# Patient Record
Sex: Female | Born: 1990 | Race: Black or African American | Hispanic: No | Marital: Married | Smoking: Never smoker
Health system: Southern US, Community
[De-identification: ages and names within clinical notes are randomized; demographics above are authoritative.]

## PROBLEM LIST (undated history)

## (undated) ENCOUNTER — Inpatient Hospital Stay (HOSPITAL_COMMUNITY): Payer: Self-pay

## (undated) ENCOUNTER — Emergency Department (HOSPITAL_COMMUNITY): Admission: EM | Payer: 59

## (undated) DIAGNOSIS — S83519A Sprain of anterior cruciate ligament of unspecified knee, initial encounter: Secondary | ICD-10-CM

## (undated) DIAGNOSIS — R87629 Unspecified abnormal cytological findings in specimens from vagina: Secondary | ICD-10-CM

## (undated) HISTORY — PX: NO PAST SURGERIES: SHX2092

## (undated) HISTORY — DX: Unspecified abnormal cytological findings in specimens from vagina: R87.629

---

## 2012-02-29 ENCOUNTER — Encounter (HOSPITAL_COMMUNITY): Payer: Self-pay

## 2012-02-29 ENCOUNTER — Emergency Department (HOSPITAL_COMMUNITY)
Admission: EM | Admit: 2012-02-29 | Discharge: 2012-02-29 | Disposition: A | Discharge status: Home / Self Care | Payer: Medicaid Other | Attending: Emergency Medicine | Admitting: Emergency Medicine

## 2012-02-29 ENCOUNTER — Emergency Department (HOSPITAL_COMMUNITY): Payer: Medicaid Other

## 2012-02-29 DIAGNOSIS — M25569 Pain in unspecified knee: Secondary | ICD-10-CM | POA: Insufficient documentation

## 2012-02-29 DIAGNOSIS — M25561 Pain in right knee: Secondary | ICD-10-CM

## 2012-02-29 MED ORDER — IBUPROFEN 800 MG PO TABS
800.0000 mg | ORAL_TABLET | Freq: Once | ORAL | Status: AC
  Administered 2012-02-29: 800 mg via ORAL
  Filled 2012-02-29: qty 1

## 2012-02-29 MED ORDER — IBUPROFEN 800 MG PO TABS: 800.0000 mg | ORAL_TABLET | Freq: Three times a day (TID) | ORAL | Status: AC | PRN

## 2012-02-29 NOTE — Discharge Instructions (Signed)
Read the information below.  Please follow up with the orthopedist listed above if you continue to have pain.  Use the RICE treatment as discussed.  You may return to the ER at any time for worsening condition or any new symptoms that concern you.   Knee Pain The knee is the complex joint between your thigh and your lower leg. It is made up of bones, tendons, ligaments, and cartilage. The bones that make up the knee are:  The femur in the thigh.   The tibia and fibula in the lower leg.   The patella or kneecap riding in the groove on the lower femur.  CAUSES  Knee pain is a common complaint with many causes. A few of these causes are:  Injury, such as:   A ruptured ligament or tendon injury.   Torn cartilage.   Medical conditions, such as:   Gout   Arthritis   Infections   Overuse, over training or overdoing a physical activity.  Knee pain can be minor or severe. Knee pain can accompany debilitating injury. Minor knee problems often respond well to self-care measures or get well on their own. More serious injuries may need medical intervention or even surgery. SYMPTOMS The knee is complex. Symptoms of knee problems can vary widely. Some of the problems are:  Pain with movement and weight bearing.   Swelling and tenderness.   Buckling of the knee.   Inability to straighten or extend your knee.   Your knee locks and you cannot straighten it.   Warmth and redness with pain and fever.   Deformity or dislocation of the kneecap.  DIAGNOSIS  Determining what is wrong may be very straight forward such as when there is an injury. It can also be challenging because of the complexity of the knee. Tests to make a diagnosis may include:  Your caregiver taking a history and doing a physical exam.   Routine X-rays can be used to rule out other problems. X-rays will not reveal a cartilage tear. Some injuries of the knee can be diagnosed by:   Arthroscopy a surgical technique by  which a small video camera is inserted through tiny incisions on the sides of the knee. This procedure is used to examine and repair internal knee joint problems. Tiny instruments can be used during arthroscopy to repair the torn knee cartilage (meniscus).   Arthrography is a radiology technique. A contrast liquid is directly injected into the knee joint. Internal structures of the knee joint then become visible on X-ray film.   An MRI scan is a non x-ray radiology procedure in which magnetic fields and a computer produce two- or three-dimensional images of the inside of the knee. Cartilage tears are often visible using an MRI scanner. MRI scans have largely replaced arthrography in diagnosing cartilage tears of the knee.   Blood work.   Examination of the fluid that helps to lubricate the knee joint (synovial fluid). This is done by taking a sample out using a needle and a syringe.  TREATMENT The treatment of knee problems depends on the cause. Some of these treatments are:  Depending on the injury, proper casting, splinting, surgery or physical therapy care will be needed.   Give yourself adequate recovery time. Do not overuse your joints. If you begin to get sore during workout routines, back off. Slow down or do fewer repetitions.   For repetitive activities such as cycling or running, maintain your strength and nutrition.   Alternate muscle  groups. For example if you are a weight lifter, work the upper body on one day and the lower body the next.   Either tight or weak muscles do not give the proper support for your knee. Tight or weak muscles do not absorb the stress placed on the knee joint. Keep the muscles surrounding the knee strong.   Take care of mechanical problems.   If you have flat feet, orthotics or special shoes may help. See your caregiver if you need help.   Arch supports, sometimes with wedges on the inner or outer aspect of the heel, can help. These can shift pressure  away from the side of the knee most bothered by osteoarthritis.   A brace called an "unloader" brace also may be used to help ease the pressure on the most arthritic side of the knee.   If your caregiver has prescribed crutches, braces, wraps or ice, use as directed. The acronym for this is PRICE. This means protection, rest, ice, compression and elevation.   Nonsteroidal anti-inflammatory drugs (NSAID's), can help relieve pain. But if taken immediately after an injury, they may actually increase swelling. Take NSAID's with food in your stomach. Stop them if you develop stomach problems. Do not take these if you have a history of ulcers, stomach pain or bleeding from the bowel. Do not take without your caregiver's approval if you have problems with fluid retention, heart failure, or kidney problems.   For ongoing knee problems, physical therapy may be helpful.   Glucosamine and chondroitin are over-the-counter dietary supplements. Both may help relieve the pain of osteoarthritis in the knee. These medicines are different from the usual anti-inflammatory drugs. Glucosamine may decrease the rate of cartilage destruction.   Injections of a corticosteroid drug into your knee joint may help reduce the symptoms of an arthritis flare-up. They may provide pain relief that lasts a few months. You may have to wait a few months between injections. The injections do have a small increased risk of infection, water retention and elevated blood sugar levels.   Hyaluronic acid injected into damaged joints may ease pain and provide lubrication. These injections may work by reducing inflammation. A series of shots may give relief for as long as 6 months.   Topical painkillers. Applying certain ointments to your skin may help relieve the pain and stiffness of osteoarthritis. Ask your pharmacist for suggestions. Many over the-counter products are approved for temporary relief of arthritis pain.   In some countries,  doctors often prescribe topical NSAID's for relief of chronic conditions such as arthritis and tendinitis. A review of treatment with NSAID creams found that they worked as well as oral medications but without the serious side effects.  PREVENTION  Maintain a healthy weight. Extra pounds put more strain on your joints.   Get strong, stay limber. Weak muscles are a common cause of knee injuries. Stretching is important. Include flexibility exercises in your workouts.   Be smart about exercise. If you have osteoarthritis, chronic knee pain or recurring injuries, you may need to change the way you exercise. This does not mean you have to stop being active. If your knees ache after jogging or playing basketball, consider switching to swimming, water aerobics or other low-impact activities, at least for a few days a week. Sometimes limiting high-impact activities will provide relief.   Make sure your shoes fit well. Choose footwear that is right for your sport.   Protect your knees. Use the proper gear for knee-sensitive  activities. Use kneepads when playing volleyball or laying carpet. Buckle your seat belt every time you drive. Most shattered kneecaps occur in car accidents.   Rest when you are tired.  SEEK MEDICAL CARE IF:  You have knee pain that is continual and does not seem to be getting better.  SEEK IMMEDIATE MEDICAL CARE IF:  Your knee joint feels hot to the touch and you have a high fever. MAKE SURE YOU:   Understand these instructions.   Will watch your condition.   Will get help right away if you are not doing well or get worse.  Document Released: 07/14/2007 Document Revised: 09/05/2011 Document Reviewed: 07/14/2007 [facility] Patient Information 2012 [place], [place].

## 2012-02-29 NOTE — ED Notes (Signed)
Pt complains of rightt knee and leg pain since roughhousing yesterday at home, sts twisted knee now had difficulty bearing weight on right leg.

## 2012-02-29 NOTE — Progress Notes (Signed)
Orthopedic Tech Progress Note Patient Details:  Kelly [name] 1991/02/21 [id number]  Ortho Devices Type of Ortho Device: Knee Immobilizer;Crutches Ortho Device/Splint Interventions: Application   Cammer, Mickie Bail 02/29/2012, 2:30 PM

## 2012-02-29 NOTE — ED Provider Notes (Signed)
History     CSN: [id number]  Arrival date & time 02/29/12  1305   First MD Initiated Contact with Patient 02/29/12 1322      Chief Complaint  Patient presents with  . Leg Injury    (Consider location/radiation/quality/duration/timing/severity/associated sxs/prior treatment) HPI Comments: Patient reports she was running around her house playing, jumping into her room and twisted her right knee.  Since that moment, she has had diffuse pain in her anterior knee, pain with complete straightening or with flexing more than 90 degrees.  She is also having pain with any weight bearing.  Denies calf or ankle pain, weakness or numbness of her distal leg.  No hx of problems with this knee previously.    The history is provided by the patient and a parent.    History reviewed. No pertinent past medical history.  History reviewed. No pertinent past surgical history.  History reviewed. No pertinent family history.  History  Substance Use Topics  . Smoking status: Not on file  . Smokeless tobacco: Not on file  . Alcohol Use: Not on file    OB History    Grav Para Term Preterm Abortions TAB SAB Ect Mult Living                  Review of Systems  Skin: Negative for color change and wound.  Neurological: Negative for weakness and numbness.    Allergies  Review of patient's allergies indicates no known allergies.  Home Medications   Current Outpatient Rx  Name Route Sig Dispense Refill  . VITAMIN E 1000 UNITS PO CAPS Oral Take 1,000 Units by mouth every other day.      BP 115/69  Pulse 70  Temp(Src) 97.9 F (36.6 C) (Oral)  Resp 18  SpO2 99%  LMP 02/17/2012  Physical Exam  Nursing note and vitals reviewed. Constitutional: She appears well-developed and well-nourished.  HENT:  Head: Normocephalic and atraumatic.  Neck: Neck supple.  Pulmonary/Chest: Effort normal.  Musculoskeletal:       Right knee: She exhibits decreased range of motion, swelling and abnormal  meniscus. She exhibits no ecchymosis, no deformity, no laceration, no erythema, normal alignment, no LCL laxity and no MCL laxity. tenderness found. Medial joint line and lateral joint line tenderness noted.       Right knee: Diffuse tenderness around patella.  Negative anterior and posterior drawer test.  Thessaley test reproduces pain.  Distal pulses and sensation intact.    Neurological: She is alert.    ED Course  Procedures (including critical care time)  Labs Reviewed - No data to display Dg Knee Complete 4 Views Right  02/29/2012  *RADIOLOGY REPORT*  Clinical Data: Leg injury  RIGHT KNEE - COMPLETE 4+ VIEW  Comparison: None  Findings: There is no evidence of fracture or dislocation.  There is no evidence of arthropathy or other focal bone abnormality. Soft tissues are unremarkable.  IMPRESSION: Negative exam.  Original Report Authenticated By: Rosealee Albee, M.D.    1:41 PM Patient seen and examined.  Likely sprain or other ligamentous injury.  Mother insists on xray.    1. Pain in right knee       MDM  Patient with twisting injury to right knee.  No effusion, xray negative.  Suspect sprain vs meniscus injury.  Pt d/c home with knee immobilizer, crutches, pain medication, orthopedic follow up.  Patient verbalizes understanding and agrees with plan.         Rise Patience,  PA 02/29/12 2223

## 2012-03-07 NOTE — ED Provider Notes (Signed)
History/physical exam/procedure(s) were performed by non-physician practitioner and as supervising physician I was immediately available for consultation/collaboration. I have reviewed all notes and am in agreement with care and plan.   Koralyn Prestage S Bela Nyborg, MD 03/07/12 1724 

## 2013-02-08 ENCOUNTER — Ambulatory Visit (INDEPENDENT_AMBULATORY_CARE_PROVIDER_SITE_OTHER): Payer: Self-pay | Admitting: Gynecology

## 2013-02-08 ENCOUNTER — Encounter: Payer: Self-pay | Admitting: Gynecology

## 2013-02-08 VITALS — BP 120/68 | Ht 66.34 in | Wt 155.0 lb

## 2013-02-08 DIAGNOSIS — Z01419 Encounter for gynecological examination (general) (routine) without abnormal findings: Secondary | ICD-10-CM

## 2013-02-08 DIAGNOSIS — Z0000 Encounter for general adult medical examination without abnormal findings: Secondary | ICD-10-CM

## 2013-02-08 DIAGNOSIS — Z113 Encounter for screening for infections with a predominantly sexual mode of transmission: Secondary | ICD-10-CM

## 2013-02-08 DIAGNOSIS — Z309 Encounter for contraceptive management, unspecified: Secondary | ICD-10-CM

## 2013-02-08 LAB — POCT URINALYSIS DIPSTICK

## 2013-02-08 LAB — POCT URINE PREGNANCY: Preg Test, Ur: NEGATIVE

## 2013-02-08 NOTE — Patient Instructions (Addendum)
Contraception Choices  Contraception (birth control) is the use of any methods or devices to prevent pregnancy. Below are some methods to help avoid pregnancy.  HORMONAL METHODS   · Contraceptive implant. This is a thin, plastic tube containing progesterone hormone. It does not contain estrogen hormone. Your caregiver inserts the tube in the inner part of the upper arm. The tube can remain in place for up to 3 years. After 3 years, the implant must be removed. The implant prevents the ovaries from releasing an egg (ovulation), thickens the cervical mucus which prevents sperm from entering the uterus, and thins the lining of the inside of the uterus.  · Progesterone-only injections. These injections are given every 3 months by your caregiver to prevent pregnancy. This synthetic progesterone hormone stops the ovaries from releasing eggs. It also thickens cervical mucus and changes the uterine lining. This makes it harder for sperm to survive in the uterus.  · Birth control pills. These pills contain estrogen and progesterone hormone. They work by stopping the egg from forming in the ovary (ovulation). Birth control pills are prescribed by a caregiver. Birth control pills can also be used to treat heavy periods.  · Minipill. This type of birth control pill contains only the progesterone hormone. They are taken every day of each month and must be prescribed by your caregiver.  · Birth control patch. The patch contains hormones similar to those in birth control pills. It must be changed once a week and is prescribed by a caregiver.  · Vaginal ring. The ring contains hormones similar to those in birth control pills. It is left in the vagina for 3 weeks, removed for 1 week, and then a new one is put back in place. The patient must be comfortable inserting and removing the ring from the vagina. A caregiver's prescription is necessary.  · Emergency contraception. Emergency contraceptives prevent pregnancy after unprotected  sexual intercourse. This pill can be taken right after sex or up to 5 days after unprotected sex. It is most effective the sooner you take the pills after having sexual intercourse. Emergency contraceptive pills are available without a prescription. Check with your pharmacist. Do not use emergency contraception as your only form of birth control.  BARRIER METHODS   · Female condom. This is a thin sheath (latex or rubber) that is worn over the penis during sexual intercourse. It can be used with spermicide to increase effectiveness.  · Female condom. This is a soft, loose-fitting sheath that is put into the vagina before sexual intercourse.  · Diaphragm. This is a soft, latex, dome-shaped barrier that must be fitted by a caregiver. It is inserted into the vagina, along with a spermicidal jelly. It is inserted before intercourse. The diaphragm should be left in the vagina for 6 to 8 hours after intercourse.  · Cervical cap. This is a round, soft, latex or plastic cup that fits over the cervix and must be fitted by a caregiver. The cap can be left in place for up to 48 hours after intercourse.  · Sponge. This is a soft, circular piece of polyurethane foam. The sponge has spermicide in it. It is inserted into the vagina after wetting it and before sexual intercourse.  · Spermicides. These are chemicals that kill or block sperm from entering the cervix and uterus. They come in the form of creams, jellies, suppositories, foam, or tablets. They do not require a prescription. They are inserted into the vagina with an applicator before having sexual intercourse.   IUD). This is a T-shaped device that is put in a woman's uterus during a menstrual period to prevent pregnancy. There are 2 types:  Copper IUD. This type of IUD is wrapped in copper wire and is placed inside the uterus. Copper makes the uterus and  fallopian tubes produce a fluid that kills sperm. It can stay in place for 10 years.  Hormone IUD. This type of IUD contains the hormone progestin (synthetic progesterone). The hormone thickens the cervical mucus and prevents sperm from entering the uterus, and it also thins the uterine lining to prevent implantation of a fertilized egg. The hormone can weaken or kill the sperm that get into the uterus. It can stay in place for 5 years. PERMANENT METHODS OF CONTRACEPTION  Female tubal ligation. This is when the woman's fallopian tubes are surgically sealed, tied, or blocked to prevent the egg from traveling to the uterus.  Female sterilization. This is when the female has the tubes that carry sperm tied off (vasectomy).This blocks sperm from entering the vagina during sexual intercourse. After the procedure, the man can still ejaculate fluid (semen). NATURAL PLANNING METHODS  Natural family planning. This is not having sexual intercourse or using a barrier method (condom, diaphragm, cervical cap) on days the woman could become pregnant.  Calendar method. This is keeping track of the length of each menstrual cycle and identifying when you are fertile.  Ovulation method. This is avoiding sexual intercourse during ovulation.  Symptothermal method. This is avoiding sexual intercourse during ovulation, using a thermometer and ovulation symptoms.  Post-ovulation method. This is timing sexual intercourse after you have ovulated. Regardless of which type or method of contraception you choose, it is important that you use condoms to protect against the transmission of sexually transmitted diseases (STDs). Talk with your caregiver about which form of contraception is most appropriate for you. Document Released: 09/16/2005 Document Revised: 12/09/2011 Document Reviewed: 01/23/2011 [facility] Patient Information 2013 [place], [place].   Call if decide to become sexually active

## 2013-02-08 NOTE — Progress Notes (Signed)
22 y.o.  Single  African American female   No obstetric history on file. here for annual exam.  Pt reports first sexual partner in March, reports condoms caused swelling, itching and redness. LMP was Plan B induced.  Pt not interested in continuing sexual activities until marriage, but is interested in her options.  Planning [employer] transfer next year.  Pt finished gardasil vaccine at 17.   Patient's last menstrual period was 01/16/2013.          Sexually active: yes  The current method of family planning is OCP (estrogen/progesterone).    Exercising: light weights lifting; running qoday Last mammogram:  none Last pap smear: none History of abnormal pap: none Smoking: none Alcohol: none  Last colonoscopy: none Last Bone Density:  none Last tetanus shot: none Last cholesterol check:  none  Hgb:                Urine: Leuks 2     No health maintenance topics applied.  Family History  Problem Relation Age of Onset  . Diabetes Father   . Breast cancer Maternal Grandmother   . Pancreatic cancer Maternal Grandmother     There are no active problems to display for this patient.   No past medical history on file.  No past surgical history on file.  Allergies: Review of patient's allergies indicates no known allergies.  Current Outpatient Prescriptions  Medication Sig Dispense Refill  . vitamin E 1000 UNIT capsule Take 1,000 Units by mouth every other day.       No current facility-administered medications for this visit.    ROS: Pertinent items are noted in HPI.  Social Hx:    Exam:    BP 120/68  Ht 5' 6.34" (1.685 m)  Wt 155 lb (70.308 kg)  BMI 24.76 kg/m2  LMP 01/16/2013   Wt Readings from Last 3 Encounters:  02/08/13 155 lb (70.308 kg) (84%*, Z = 0.99)  02/29/12 157 lb (71.215 kg) (87%*, Z = 1.13)   * Growth percentiles are based on CDC 2-20 Years data.     Ht Readings from Last 3 Encounters:  02/08/13 5' 6.34" (1.685 m) (79%*, Z = 0.80)  02/29/12 5\' 8"  (1.727  m) (93%*, Z = 1.46)   * Growth percentiles are based on CDC 2-20 Years data.    General appearance: alert, cooperative and appears stated age Head: Normocephalic, without obvious abnormality, atraumatic Neck: no adenopathy, supple, symmetrical, trachea midline and thyroid not enlarged, symmetric, no tenderness/mass/nodules Lungs: clear to auscultation bilaterally Breasts: Inspection negative, No nipple retraction or dimpling, No nipple discharge or bleeding, No axillary or supraclavicular adenopathy, Normal to palpation without dominant masses Heart: regular rate and rhythm Abdomen: soft, non-tender; bowel sounds normal; no masses,  no organomegaly Extremities: extremities normal, atraumatic, no cyanosis or edema Skin: Skin color, texture, turgor normal. No rashes or lesions Lymph nodes: Cervical, supraclavicular, and axillary nodes normal. No abnormal inguinal nodes palpated Neurologic: Grossly normal   Pelvic: External genitalia:  no lesions              Urethra:  normal appearing urethra with no masses, tenderness or lesions              Bartholins and Skenes: normal                 Vagina: normal appearing vagina with normal color and discharge, no lesions              Cervix: normal appearance  Pap taken: no        Bimanual Exam:  Uterus:  uterus is normal size, shape, consistency and nontender                                      Adnexa: normal adnexa in size, nontender and no masses                                      Rectovaginal: Confirms                                      Anus:  normal sphincter tone, no lesions  A: normal gyn exam     P: counseled on STD prevention, use and side effects of OCP's, diet and exercise Info on contraceptive choices given PAP next year return annually or prn     An After Visit Summary was printed and given to the patient.

## 2013-02-09 LAB — GC/CHLAMYDIA PROBE AMP, URINE: GC Probe Amp, Urine: NEGATIVE

## 2013-02-10 ENCOUNTER — Telehealth: Payer: Self-pay | Admitting: *Deleted

## 2013-02-10 NOTE — Telephone Encounter (Signed)
Left Message To Call Back  

## 2013-02-10 NOTE — Telephone Encounter (Signed)
Message copied by Lorraine Lax on Wed Feb 10, 2013  9:34 AM ------      Message from: Douglass Rivers      Created: Tue Feb 09, 2013  3:17 PM       Inform gc/ctm neg ------

## 2013-02-11 NOTE — Telephone Encounter (Signed)
Pt notified of labs 02/10/13 ( see result note)

## 2013-02-27 ENCOUNTER — Encounter: Payer: Self-pay | Admitting: Physician Assistant

## 2013-02-27 ENCOUNTER — Ambulatory Visit: Payer: Self-pay | Admitting: Physician Assistant

## 2013-02-27 VITALS — BP 120/76 | HR 64 | Temp 98.1°F | Resp 16 | Ht 67.0 in | Wt 158.0 lb

## 2013-02-27 DIAGNOSIS — R350 Frequency of micturition: Secondary | ICD-10-CM

## 2013-02-27 DIAGNOSIS — Z131 Encounter for screening for diabetes mellitus: Secondary | ICD-10-CM

## 2013-02-27 LAB — POCT URINALYSIS DIPSTICK
Glucose, UA: 250
Ketones, UA: 15
Nitrite, UA: POSITIVE
Protein, UA: 100
Spec Grav, UA: <=1.005
Urobilinogen, UA: >=8
pH, UA: 5

## 2013-02-27 LAB — POCT UA - MICROSCOPIC ONLY
Bacteria, U Microscopic: NEGATIVE
Casts, Ur, LPF, POC: NEGATIVE
Epithelial cells, urine per micros: NEGATIVE
Mucus, UA: NEGATIVE
Yeast, UA: NEGATIVE

## 2013-02-27 LAB — GLUCOSE, POCT (MANUAL RESULT ENTRY): POC Glucose: 107 mg/dl — AB (ref 70–99)

## 2013-02-27 MED ORDER — CIPROFLOXACIN HCL 500 MG PO TABS: 500.0000 mg | ORAL_TABLET | Freq: Two times a day (BID) | ORAL | Status: DC

## 2013-02-27 NOTE — Progress Notes (Signed)
  Subjective:    Patient ID: Kelly [name], female    DOB: 04-Feb-1991, 22 y.o.   MRN: [id number]  HPI 22 year old female presents with 10 day history of urinary frequency, dysuria, suprapubic pressure, and urinary hesitancy.  States symptoms have progressively worsened despite OTC treatment with cranberry pills and AZO.  Denies hematuria, vaginal discharge, fever, chills, nausea, or vomiting.  Had recent STD screening on 02/10/13 which was negative.   Patient otherwise healthy with no other concerns today.    Review of Systems  Constitutional: Negative for fever and chills.  Gastrointestinal: Negative for nausea, vomiting and abdominal pain.  Genitourinary: Positive for dysuria and frequency. Negative for vaginal discharge.  Neurological: Negative for headaches.       Objective:   Physical Exam  Constitutional: She is oriented to person, place, and time. She appears well-developed and well-nourished.  HENT:  Head: Normocephalic and atraumatic.  Right Ear: External ear normal.  Left Ear: External ear normal.  Eyes: Conjunctivae are normal.  Neck: Normal range of motion.  Cardiovascular: Normal rate, regular rhythm and normal heart sounds.   Pulmonary/Chest: Effort normal and breath sounds normal.  Abdominal: Soft. Bowel sounds are normal. There is tenderness (suprapubic). There is no rebound, no guarding and no CVA tenderness.  Neurological: She is alert and oriented to person, place, and time.  Psychiatric: She has a normal mood and affect. Her behavior is normal. Judgment and thought content normal.     Results for orders placed in visit on 02/27/13  POCT URINALYSIS DIPSTICK      Result Value Range   Color, UA red     Clarity, UA cloudy     Glucose, UA 250     Bilirubin, UA moderate     Ketones, UA 15     Spec Grav, UA <=1.005     Blood, UA trace     pH, UA 5.0     Protein, UA 100     Urobilinogen, UA >=8.0     Nitrite, UA positive     Leukocytes, UA large (3+)    POCT  UA - MICROSCOPIC ONLY      Result Value Range   WBC, Ur, HPF, POC 0-2     RBC, urine, microscopic 0-1     Bacteria, U Microscopic neg     Mucus, UA neg     Epithelial cells, urine per micros neg     Crystals, Ur, HPF, POC oxalate     Casts, Ur, LPF, POC neg     Yeast, UA neg    GLUCOSE, POCT (MANUAL RESULT ENTRY)      Result Value Range   POC Glucose 107 (*) 70 - 99 mg/dl        Assessment & Plan:  Urine frequency - Plan: POCT urinalysis dipstick, POCT UA - Microscopic Only, Urine culture, ciprofloxacin (CIPRO) 500 MG tablet  Screening for diabetes mellitus - Plan: POCT glucose (manual entry)  Urine culture sent Start Cipro 500 mg bid x 7 days Continue Azo as needed for dysuria Follow up if symptoms worsen or fail to improve

## 2013-03-01 LAB — URINE CULTURE
Colony Count: NO GROWTH
Organism ID, Bacteria: NO GROWTH

## 2013-03-03 ENCOUNTER — Telehealth: Payer: Self-pay

## 2013-03-03 NOTE — Telephone Encounter (Signed)
No, we cannot send this note in. We did not discuss her being out of work and if she is still feeling that badly, she needs a recheck.

## 2013-03-03 NOTE — Telephone Encounter (Signed)
Kelly [name] had indicated in her note patient would return to work same day she was evaluated (02/27/13) please advise if okay to provide work note for days requested.

## 2013-03-03 NOTE — Telephone Encounter (Signed)
Pt.notified

## 2013-03-03 NOTE — Telephone Encounter (Signed)
Pt is still having uti symptoms and the lab results has come back that she did not have a uti   Please call [phone]

## 2013-03-03 NOTE — Telephone Encounter (Signed)
Patient is requesting an updated return to work note. Patient had E. Coli and was out of work from 6/1-6/3 and returned to work today 6/4. Patient also wants this note faxed to her job if possible. Fax #: [phone]. Call back #: [phone]

## 2013-03-04 NOTE — Telephone Encounter (Signed)
Thanks, I called her to advise, unfortunately her mail box is not set up yet.

## 2013-03-04 NOTE — Telephone Encounter (Signed)
Please advise. She continues to have urinary frequency.

## 2013-03-04 NOTE — Telephone Encounter (Signed)
RTC for further testing.

## 2013-03-05 NOTE — Telephone Encounter (Signed)
I spoke with patient in regards to her situation she stated that she seems to have bladder weakness. She recently had a pelvic exam performed at her gynecologist. I spoke with Herbert Seta (who saw her for UTI sxs) she did not feel the pelvic exam would cause this. I informed pt of Heather's thoughts, but also advised her to contact her GYN if she had concerns regarding that. I also stated that she mentioned having urinary frequency still and I told her we would need to re-evaluate due to her ua cx being negative. She understood.

## 2013-08-22 ENCOUNTER — Emergency Department (HOSPITAL_COMMUNITY)
Admission: EM | Admit: 2013-08-22 | Discharge: 2013-08-22 | Disposition: A | Discharge status: Home / Self Care | Payer: Self-pay | Attending: Emergency Medicine | Admitting: Emergency Medicine

## 2013-08-22 ENCOUNTER — Emergency Department (HOSPITAL_COMMUNITY): Payer: Self-pay

## 2013-08-22 ENCOUNTER — Encounter (HOSPITAL_COMMUNITY): Payer: Self-pay | Admitting: Emergency Medicine

## 2013-08-22 DIAGNOSIS — O200 Threatened abortion: Secondary | ICD-10-CM | POA: Insufficient documentation

## 2013-08-22 DIAGNOSIS — Z791 Long term (current) use of non-steroidal anti-inflammatories (NSAID): Secondary | ICD-10-CM | POA: Insufficient documentation

## 2013-08-22 DIAGNOSIS — O009 Unspecified ectopic pregnancy without intrauterine pregnancy: Secondary | ICD-10-CM | POA: Insufficient documentation

## 2013-08-22 DIAGNOSIS — N949 Unspecified condition associated with female genital organs and menstrual cycle: Secondary | ICD-10-CM | POA: Insufficient documentation

## 2013-08-22 LAB — URINALYSIS, ROUTINE W REFLEX MICROSCOPIC
Bilirubin Urine: NEGATIVE
Glucose, UA: NEGATIVE mg/dL
Ketones, ur: NEGATIVE mg/dL
Nitrite: NEGATIVE
pH: 6 (ref 5.0–8.0)

## 2013-08-22 LAB — CBC
HCT: 36.8 % (ref 36.0–46.0)
Hemoglobin: 12.2 g/dL (ref 12.0–15.0)
MCH: 28.5 pg (ref 26.0–34.0)
MCV: 86 fL (ref 78.0–100.0)
RBC: 4.28 MIL/uL (ref 3.87–5.11)
WBC: 4.6 10*3/uL (ref 4.0–10.5)

## 2013-08-22 LAB — URINE MICROSCOPIC-ADD ON

## 2013-08-22 LAB — ABO/RH: ABO/RH(D): A POS

## 2013-08-22 MED ORDER — NAPROXEN 500 MG PO TABS: 500.0000 mg | ORAL_TABLET | Freq: Two times a day (BID) | ORAL | Status: DC

## 2013-08-22 NOTE — ED Notes (Signed)
Pt had POSITIVE POC pregnancy test.

## 2013-08-22 NOTE — ED Notes (Signed)
Spoke with EDP currently not discharging patient at this time.

## 2013-08-22 NOTE — ED Notes (Signed)
Per pt she has been having cramps since this am with moderate vaginal bleeding. sts the cramps have subsided since this am. Pt had menstrual cycle last week.

## 2013-08-22 NOTE — ED Provider Notes (Addendum)
CSN: [id number]     Arrival date & time 08/22/13  1336 History   First MD Initiated Contact with Patient 08/22/13 1446     Chief Complaint  Patient presents with  . Vaginal Bleeding    HPI  The patient is nervous and vaginal bleeding. Her this morning. Some cramping of similar periods. Her last normal menstrual period the 14th 90s ago. About 5 weeks before that and just before her menstrual cycle October she to plan B. Had a menstrual cycle she considered normal with a week of that. Presents with the above complaints. Bleed was morning and she is asymptomatic now  History reviewed. No pertinent past medical history. History reviewed. No pertinent past surgical history. Family History  Problem Relation Age of Onset  . Diabetes Father   . Breast cancer Maternal Grandmother   . Pancreatic cancer Maternal Grandmother    History  Substance Use Topics  . Smoking status: Never Smoker   . Smokeless tobacco: Not on file  . Alcohol Use: No   OB History   Grav Para Term Preterm Abortions TAB SAB Ect Mult Living                 Review of Systems  Constitutional: Negative for fever, chills, diaphoresis, appetite change and fatigue.  HENT: Negative for mouth sores, sore throat and trouble swallowing.   Eyes: Negative for visual disturbance.  Respiratory: Negative for cough, chest tightness, shortness of breath and wheezing.   Cardiovascular: Negative for chest pain.  Gastrointestinal: Negative for nausea, vomiting, abdominal pain, diarrhea and abdominal distention.  Endocrine: Negative for polydipsia, polyphagia and polyuria.  Genitourinary: Positive for vaginal bleeding, menstrual problem and pelvic pain. Negative for dysuria, frequency and hematuria.  Musculoskeletal: Negative for gait problem.  Skin: Negative for color change, pallor and rash.  Neurological: Negative for dizziness, syncope, light-headedness and headaches.  Hematological: Does not bruise/bleed easily.   Psychiatric/Behavioral: Negative for behavioral problems and confusion.    Allergies  Review of patient's allergies indicates no known allergies.  Home Medications   No current outpatient prescriptions on file. BP 124/83  Pulse 73  Temp(Src) 98.3 F (36.8 C) (Oral)  Resp 20  Wt 151 lb 3.2 oz (68.584 kg)  SpO2 98%  LMP 08/13/2013 Physical Exam  Constitutional: She is oriented to person, place, and time. She appears well-developed and well-nourished. No distress.  HENT:  Head: Normocephalic.  Eyes: Conjunctivae are normal. Pupils are equal, round, and reactive to light. No scleral icterus.  Neck: Normal range of motion. Neck supple. No thyromegaly present.  Cardiovascular: Normal rate and regular rhythm.  Exam reveals no gallop and no friction rub.   No murmur heard. Pulmonary/Chest: Effort normal and breath sounds normal. No respiratory distress. She has no wheezes. She has no rales.  Abdominal: Soft. Bowel sounds are normal. She exhibits no distension. There is no tenderness. There is no rebound.  Benign abdomen. Nontender lower abdomen. Pelvic exam is not done.  Musculoskeletal: Normal range of motion.  Neurological: She is alert and oriented to person, place, and time.  Skin: Skin is warm and dry. No rash noted.  Psychiatric: She has a normal mood and affect. Her behavior is normal.    ED Course  Procedures (including critical care time) Labs Review Labs Reviewed  URINALYSIS, ROUTINE W REFLEX MICROSCOPIC - Abnormal; Notable for the following:    Hgb urine dipstick MODERATE (*)    All other components within normal limits  HCG, QUANTITATIVE, PREGNANCY - Abnormal; Notable  for the following:    hCG, Beta Chain, Quant, S 228 (*)    All other components within normal limits  PREGNANCY, URINE - Abnormal; Notable for the following:    Preg Test, Ur POSITIVE (*)    All other components within normal limits  URINE MICROSCOPIC-ADD ON - Abnormal; Notable for the following:     Squamous Epithelial / LPF FEW (*)    All other components within normal limits  CBC  POCT PREGNANCY, URINE  ABO/RH   Imaging Review US Ob Comp Less 14 Wks  08/22/2013   CLINICAL DATA:  Vaginal bleeding  EXAM: OBSTETRIC <14 WK [place] AND TRANSVAGINAL OB US  TECHNIQUE: Both transabdominal and transvaginal ultrasound examinations were performed for complete evaluation of the gestation as well as the maternal uterus, adnexal regions, and pelvic cul-de-sac. Transvaginal technique was performed to assess early pregnancy.  COMPARISON:  None.  FINDINGS: Intrauterine gestational sac: None visualized  Yolk sac:  None visualized  Embryo:  None visualized  Cardiac Activity: None visualized  Heart Rate:   bpm  MSD:    mm    w     d  CRL:     mm    w  d                  [place] EDC:  Maternal uterus/adnexae: Ovaries are symmetric in size and echotexture. No adnexal masses. Small amount of free fluid in the pelvis which appears mildly complex.  IMPRESSION: No intrauterine pregnancy visualized. Differential considerations would include early intrauterine pregnancy too early to visualize, spontaneous abortion, or occult ectopic pregnancy. Recommend close clinical followup and serial quantitative beta HCGs and ultrasounds. Repeat ultrasound in 10-14 days may be helpful.   Electronically Signed   By: Charlett Nose M.D.   On: 08/22/2013 17:23   US Ob Transvaginal  08/22/2013   CLINICAL DATA:  Vaginal bleeding  EXAM: OBSTETRIC <14 WK [place] AND TRANSVAGINAL OB US  TECHNIQUE: Both transabdominal and transvaginal ultrasound examinations were performed for complete evaluation of the gestation as well as the maternal uterus, adnexal regions, and pelvic cul-de-sac. Transvaginal technique was performed to assess early pregnancy.  COMPARISON:  None.  FINDINGS: Intrauterine gestational sac: None visualized  Yolk sac:  None visualized  Embryo:  None visualized  Cardiac Activity: None visualized  Heart Rate:   bpm  MSD:    mm    w     d  CRL:      mm    w  d                  [place] EDC:  Maternal uterus/adnexae: Ovaries are symmetric in size and echotexture. No adnexal masses. Small amount of free fluid in the pelvis which appears mildly complex.  IMPRESSION: No intrauterine pregnancy visualized. Differential considerations would include early intrauterine pregnancy too early to visualize, spontaneous abortion, or occult ectopic pregnancy. Recommend close clinical followup and serial quantitative beta HCGs and ultrasounds. Repeat ultrasound in 10-14 days may be helpful.   Electronically Signed   By: Charlett Nose M.D.   On: 08/22/2013 17:23    EKG Interpretation   None       MDM   1. Threatened miscarriage   2. Ectopic pregnancy     Patient's quantitative hCG was 225. Transabdominal and transvaginal ultrasound showed no sign of intrauterine , or extra uterine pregnancy. The diagnosis at this time would include her small early intrauterine pregnancy with threatened SAB. Differential diagnosis  also includes very small nonvisualized ectopic pregnancy. Recommended she receive followup beta hCG testing 40-72 hours. Given her [facility] or return to emergency room for this.   Roney Marion, MD 08/22/13 1503  Roney Marion, MD 08/22/13 [phone]

## 2013-08-25 ENCOUNTER — Emergency Department (HOSPITAL_COMMUNITY)
Admission: EM | Admit: 2013-08-25 | Discharge: 2013-08-25 | Disposition: A | Discharge status: Home / Self Care | Payer: Self-pay | Attending: Emergency Medicine | Admitting: Emergency Medicine

## 2013-08-25 ENCOUNTER — Encounter (HOSPITAL_COMMUNITY): Payer: Self-pay | Admitting: Emergency Medicine

## 2013-08-25 ENCOUNTER — Other Ambulatory Visit: Payer: Medicaid Other

## 2013-08-25 DIAGNOSIS — O9989 Other specified diseases and conditions complicating pregnancy, childbirth and the puerperium: Secondary | ICD-10-CM | POA: Insufficient documentation

## 2013-08-25 DIAGNOSIS — O200 Threatened abortion: Secondary | ICD-10-CM

## 2013-08-25 DIAGNOSIS — Z3490 Encounter for supervision of normal pregnancy, unspecified, unspecified trimester: Secondary | ICD-10-CM

## 2013-08-25 DIAGNOSIS — R109 Unspecified abdominal pain: Secondary | ICD-10-CM | POA: Insufficient documentation

## 2013-08-25 NOTE — ED Notes (Signed)
Pt in stating she has been having intermittent abd pain with back pain over the last week, states she was seen for same a few days ago and was told that she has a positive pregnancy test via blood work, states she had an ultrasound completed that did not show an intrauterine pregnancy or an ectopic, states she was told she was too early to show anything, pt followed up today for repeat blood work to see if her hormone levels changed, in tonight due to continued pain and states she wants to make sure she does not have an ectopic pregnancy.

## 2013-08-25 NOTE — ED Notes (Signed)
Pt st's she went to [facility] today and had HCG drawn but they did not give her there results.  Pt  St's  She has continued to have lower abd pain  And numbness in left leg since being seen here on Sun.  Pt wants to know if we can draw more blood tonight.

## 2013-08-25 NOTE — ED Provider Notes (Signed)
CSN: [id number]     Arrival date & time 08/25/13  1937 History   First MD Initiated Contact with Patient 08/25/13 2117     Chief Complaint  Patient presents with  . Abdominal Pain   (Consider location/radiation/quality/duration/timing/severity/associated sxs/prior Treatment) HPI  Chief complaint: Abdominal pain history provided by patient  22 year old female comes in chief complaint abdominal pain. Patient reports she was diagnosed via blood work with pregnancy 3 days ago. At that time beta hCG was 280. Ultrasound at that time showed no intrauterine or ectopic pregnancy. She returned to clinic today to have serial beta hCG checked. She returned to the emergency department tonight to see if she can have another ultrasound to verify location. Patient states she is having abdominal pain which is mainly suprapubic. It is a cramping type pain mild in severity. It is waxing and waning. No radiation of pain. She reports this is the same pain she has had since diagnosis 3 days ago. No acute worsening of pain. No vaginal bleeding. For all other associated signs and symptoms please refer to review of systems section of this chart  History reviewed. No pertinent past medical history. History reviewed. No pertinent past surgical history. Family History  Problem Relation Age of Onset  . Diabetes Father   . Breast cancer Maternal Grandmother   . Pancreatic cancer Maternal Grandmother    History  Substance Use Topics  . Smoking status: Never Smoker   . Smokeless tobacco: Not on file  . Alcohol Use: No   OB History   Grav Para Term Preterm Abortions TAB SAB Ect Mult Living                 Review of Systems  Constitutional: Negative for fatigue.  Respiratory: Negative for shortness of breath.   Cardiovascular: Negative for chest pain.  Gastrointestinal: Positive for abdominal pain. Negative for nausea and vomiting.  Genitourinary: Negative for dysuria, vaginal bleeding, vaginal discharge and  pelvic pain.  Musculoskeletal: Negative for back pain.  Skin: Negative for rash.  Neurological: Negative for headaches.  Psychiatric/Behavioral: Negative for agitation.  All other systems reviewed and are negative.    Allergies  Review of patient's allergies indicates no known allergies.  Home Medications  No current outpatient prescriptions on file. BP 105/62  Pulse 60  Temp(Src) 97.5 F (36.4 C) (Oral)  Resp 18  Wt 151 lb 4.8 oz (68.629 kg)  SpO2 100%  LMP 08/13/2013 Physical Exam  Nursing note and vitals reviewed. Constitutional: She is oriented to person, place, and time. She appears well-developed and well-nourished.  HENT:  Head: Normocephalic and atraumatic.  Eyes: EOM are normal. Pupils are equal, round, and reactive to light.  Neck: Normal range of motion.  Cardiovascular: Normal rate, regular rhythm and intact distal pulses.   Pulmonary/Chest: Effort normal and breath sounds normal. No respiratory distress.  Abdominal: Soft. She exhibits no distension. There is no tenderness. There is no rebound and no guarding.  Benign abdominal exam with no signs of rebound, peritonitis.  Musculoskeletal: Normal range of motion.  Neurological: She is alert and oriented to person, place, and time. No cranial nerve deficit. She exhibits normal muscle tone. Coordination normal.  Skin: Skin is warm and dry.  Psychiatric: She has a normal mood and affect. Her behavior is normal. Judgment and thought content normal.    ED Course  Procedures (including critical care time) Labs Review Labs Reviewed - No data to display Imaging Review No results found.  EKG Interpretation  None       MDM   1. Pregnancy   2. Abdominal pain     22 year old female positive pregnancy test 3 days ago comes in today requesting ultrasound. Patient to followup with OB today for serial beta hCG draw. States this result we are available 24 hours and she would like to now if has ectopic versus  intrauterine pregnancy. Patient has had no acute worsening of her pain. On exam she has no peritonitis, no rebound, no guarding. Benign abdominal exam. Previous beta hCG 228. Repeat beta hCG today pending. Based on the patient's abdominal exam, vital signs stable, doubt ruptured ectopic pregnancy. Patient also denies any vaginal bleeding vaginal discharge et Karie Soda. Ultrasound felt to be not helpful at this juncture. Even with appropriate increase in beta hCG from previous, likely not yet high enough to expect ultrasound identification of potential pregnancy location. With no worsening pain, bleeding, benign exam and appropriate followup with OB already scheduled (and serial HCG already pending) feel there is no need for ultrasound or repeat blood work at this time. Have recommended patient followup as scheduled with OB. Have given patient very strict return precautions. These include vaginal bleeding, worsening abdominal pain, fever, chills or any other concerning findings. Patient voiced understanding and was discharged with no further acute issues.  Patient discussed with attending Dr. Romeo Apple.     Bridgett Larsson, MD 08/25/13 2252

## 2013-08-26 NOTE — ED Provider Notes (Signed)
Medical screening examination/treatment/procedure(s) were conducted as a shared visit with resident physician and myself.  I personally evaluated the patient during the encounter.   I interviewed and examined the patient. Lungs are CTAB. Cardiac exam wnl. Abdomen soft. Pt notes mild paresthesias in her LLE which is what concerns her. The altered sensation affects her thigh and lower leg but not her foot. It does not follow a dermatomal pattern. She has normal strength and 2+ distal pulses. Her LLE is warm and well perfused. I am not sure of the etiology. She is ambulatory w/out pain. She has remained stable during her stay in the ED and has a benign abd exam. I do not think any testing is required at this time as she is being closely follow by obgyn and there is no suspicion for ectopic rupture based on her VS and multiple clinical exams.   Junius Argyle, MD 08/26/13 [phone]

## 2013-08-30 ENCOUNTER — Telehealth: Payer: Self-pay | Admitting: *Deleted

## 2013-08-30 NOTE — Telephone Encounter (Signed)
Patient left message requesting results from last week.

## 2013-08-30 NOTE — Telephone Encounter (Signed)
Patient informed of beta result. States that she had some bleeding last week but is no longer bleeding. She denies any pain.

## 2013-12-13 ENCOUNTER — Emergency Department (HOSPITAL_COMMUNITY)
Admission: EM | Admit: 2013-12-13 | Discharge: 2013-12-13 | Disposition: A | Discharge status: Home / Self Care | Payer: BC Managed Care – PPO | Attending: Emergency Medicine | Admitting: Emergency Medicine

## 2013-12-13 ENCOUNTER — Encounter (HOSPITAL_COMMUNITY): Payer: Self-pay | Admitting: Emergency Medicine

## 2013-12-13 DIAGNOSIS — R6883 Chills (without fever): Secondary | ICD-10-CM | POA: Diagnosis not present

## 2013-12-13 DIAGNOSIS — N764 Abscess of vulva: Secondary | ICD-10-CM | POA: Diagnosis present

## 2013-12-13 NOTE — ED Provider Notes (Signed)
CSN: [id number]     Arrival date & time 12/13/13  0645 History   First MD Initiated Contact with Patient 12/13/13 [phone]     Chief Complaint  Patient presents with  . Abscess     (Consider location/radiation/quality/duration/timing/severity/associated sxs/prior Treatment) HPI Pt is a [place] female c/o a "cyst to her right labia that started 2 days ago. Pt reports hx of similar smaller cysts in past after wearing tight underwear.  Pt states in the past they have resolved with warm water soaks and epson salt, however pt states this one is bigger and not improving.  Pain is constant, tender, 10/10, worse with walking because it rubs on her clothes. Reports hot and cold chills but denies fever, n/v/d. Denies urinary or vaginal symptoms.    History reviewed. No pertinent past medical history. History reviewed. No pertinent past surgical history. Family History  Problem Relation Age of Onset  . Diabetes Father   . Breast cancer Maternal Grandmother   . Pancreatic cancer Maternal Grandmother    History  Substance Use Topics  . Smoking status: Never Smoker   . Smokeless tobacco: Not on file  . Alcohol Use: No   OB History   Grav Para Term Preterm Abortions TAB SAB Ect Mult Living                 Review of Systems  Constitutional: Positive for chills. Negative for fever.  Respiratory: Negative for shortness of breath.   Cardiovascular: Negative for chest pain.  Gastrointestinal: Negative for nausea, vomiting, abdominal pain and diarrhea.  Genitourinary: Positive for vaginal pain (right labia). Negative for dysuria, hematuria, vaginal bleeding, vaginal discharge and pelvic pain.  All other systems reviewed and are negative.      Allergies  Review of patient's allergies indicates no known allergies.  Home Medications  No current outpatient prescriptions on file. BP 119/75  Pulse 92  Temp(Src) 98.5 F (36.9 C) (Oral)  Resp 16  Ht 5' 6.5" (1.689 m)  Wt 155 lb (70.308 kg)  BMI  24.65 kg/m2  SpO2 100%  LMP 12/06/2013 Physical Exam  Nursing note and vitals reviewed. Constitutional: She is oriented to person, place, and time. She appears well-developed and well-nourished.  HENT:  Head: Normocephalic and atraumatic.  Eyes: EOM are normal.  Neck: Normal range of motion.  Cardiovascular: Normal rate.   Pulmonary/Chest: Effort normal.  Genitourinary: Pelvic exam was performed with patient supine. No labial fusion. There is tenderness and lesion on the right labia. There is no rash or injury on the right labia. There is no rash, tenderness, lesion or injury on the left labia.  Right labia: 48mm area of tenderness and induration. Centralized whitehead. No active discharge.   Musculoskeletal: Normal range of motion.  Neurological: She is alert and oriented to person, place, and time.  Skin: Skin is warm and dry.  Psychiatric: She has a normal mood and affect. Her behavior is normal.    ED Course  Procedures   INCISION AND DRAINAGE Performed by: Noland Fordyce A. Consent: Verbal consent obtained. Risks and benefits: risks, benefits and alternatives were discussed Type: abscess  Body area: right labia  Anesthesia: local infiltration  Incision was made with an 18gtt needle  Local anesthetic: none  Anesthetic total: none  Complexity: simple  Drainage: purulent  Drainage amount: 1cc  Packing material: none  Patient tolerance: Patient tolerated the procedure well with no immediate complications.   Labs Review Labs Reviewed - No data to display Imaging Review No  results found.   EKG Interpretation None      MDM   Final diagnoses:  Abscess of right genital labia    Pt presenting with small abscess to right labia x2 days. Reports hot and cold chills but denies fever, n/v/d. Vitals: WNL, afebrile.  On exam 59mm abscess with centralized whitehead abscess. I&D performed with successful drainage. Not concerned for underlying cellulitis. Do not  believe antibiotics needed at this time.  Discussed home care. Resource guide provided. Advised to f/u with PCP in 2-3 days.   Pt verbalized understanding and agreement with tx plan.      Noland Fordyce, PA-C 12/13/13 [phone]

## 2013-12-13 NOTE — Discharge Instructions (Signed)
You may take tylenol or ibuprofen as needed for pain.  Be sure to use warm compresses and gentle massage to encourage continued drainage at least 3-4 times a day.  Follow up with a primary care provider or return to ER for recheck if symptoms not improving in 2-3 days.   Emergency Department Resource Guide 1) Find a Doctor and Pay Out of Pocket Although you won't have to find out who is covered by your insurance plan, it is a good idea to ask around and get recommendations. You will then need to call the office and see if the doctor you have chosen will accept you as a new patient and what types of options they offer for patients who are self-pay. Some doctors offer discounts or will set up payment plans for their patients who do not have insurance, but you will need to ask so you aren't surprised when you get to your appointment.  2) Contact Your Local Health Department Not all health departments have doctors that can see patients for sick visits, but many do, so it is worth a call to see if yours does. If you don't know where your local health department is, you can check in your phone book. The CDC also has a tool to help you locate your state's health department, and many state websites also have listings of all of their local health departments.  3) Find a [facility] If your illness is not likely to be very severe or complicated, you may want to try a walk in clinic. These are popping up all over the country in pharmacies, drugstores, and shopping centers. They're usually staffed by nurse practitioners or physician assistants that have been trained to treat common illnesses and complaints. They're usually fairly quick and inexpensive. However, if you have serious medical issues or chronic medical problems, these are probably not your best option.  No Primary Care Doctor: - Call Health Connect at  [phone] - they can help you locate a primary care doctor that  accepts your insurance, provides  certain services, etc. - Physician Referral Service- [phone]  Chronic Pain Problems: Organization         Address  Phone   Notes  [facility]  [phone] Patients need to be referred by their primary care doctor.   Medication Assistance: Organization         Address  Phone   Notes  [facility] [address] [place], [place] [address] [phone] --Must be a resident of [facility] -- Must have NO insurance coverage whatsoever (no Medicaid/ Medicare, etc.) -- The pt. MUST have a primary care doctor that directs their care regularly and follows them in the community   [facility]  [phone]   [employer]  [phone]    Agencies that provide inexpensive medical care: Microbiologist  Notes  [facility]  [phone]   [facility]    [phone]   [facility] [address] [place], [place] [address] [phone]) [phone]   [facility] [address], [place] [phone]) [phone]   [facility]    [phone]) [phone]   [facility]    [phone]   [facility]  [address], [place] Phone:  [phone], Fax:  [phone](336) [phone] Hours of Operation:  9 am - 6 pm, M-F.  Also accepts Medicaid/Medicare and self-pay.  [facility]  [address], [place] Phone: [phone](336) [phone], Fax: [phone](336) [phone]. Hours of Operation:  8:30 am - 5:30 pm, M-F.  Also accepts Medicaid and self-pay.  [facility] [place] [address], [place] Phone: [phone](336) [phone]   [facility] [address], [place], [place] [phone], Ext. [phone] Mondays & Thursdays: 7-9 AM.  First 15 patients are seen on a first come,  first serve basis.    Medicaid-accepting [facility] Providers:  Organization         Address                                                                       Phone                               Notes  [facility] [address], [place] [phone]) [phone] Also accepts self-pay patients.  [facility] [address] [place], [place]  [phone]) [phone]   [facility] [address], [place] [phone]) [phone]   [facility] [address] [place] [address], [place] [phone]) [phone]   Renaye RakersVeita Bland [address], [place]   [phone]) [phone] Only accepts [place] Access [place] patients after they have their name applied to their card.   Self-Pay (no insurance) in [facility]:   Organization         Address                                                     Phone               Notes  Sickle Cell Patients, [facility] [address], [place] [phone]) [phone]   [facility] [address], [place] [phone]) [phone]   [facility] [place]  [address], [place] [phone]) [phone]   [facility]/Dr. Osei-Bonsu  [address] [place] [address], [place] or [phone], [place] [phone]) [phone] Phone number for both [place] and [place] locations is the same.  [facility] [address], [place] [phone](336) [phone]   [facility] [address] [place] [address], [place] or [address] [phone]) [phone]) [phone]   [facility] [address], [place] [phone], phone; [phone], fax Sees patients 1st and 3rd Saturday of every month.  Must not qualify for public or private insurance (i.e. Medicaid, Medicare, [place] Health Choice, Veterans' Benefits)  Household income should be no more than 200% of the poverty level The clinic cannot treat you if you  are pregnant or think you are pregnant  Sexually transmitted diseases are not treated at the clinic.    Dental Care: Organization         Address                                  Phone                       Notes  [facility] [address] [place], [place] [phone] Accepts children up to age 42 who are enrolled in [place] or [place] Health Choice; pregnant women with a Medicaid card; and children who have applied for Medicaid or [place] Health Choice, but were declined, whose parents can pay a reduced fee at time of service.  [facility]  [address], [place] [phone] Accepts children up to age 8 who are enrolled in [place] or [place] Health Choice; pregnant women with a Medicaid card; and children who have applied for Medicaid or [place] Health Choice, but were declined, whose parents can pay a reduced fee at time of service.  [facility]  [address] [place], [place] [phone] Patients are seen by appointment only. Walk-ins are not accepted. [facility] will see patients 54 years of age and older. Monday - Tuesday (8am-5pm) Most Wednesdays (8:30-5pm) $30 per visit, cash only  [facility]  [address], [facility] [phone] Patients are seen by appointment only. Walk-ins are not accepted. [facility] will see patients 16 years of age and older. One Wednesday Evening (Monthly: Volunteer Based).  $30 per visit, cash only  [employer]  [phone] for adults; Children under age 52, call Graduate Pediatric Dentistry at [phone]. Children aged 46-14, please call [phone] to request a pediatric application.  Dental services are provided in all areas of dental care including fillings, crowns and bridges, complete and partial dentures, implants, gum treatment, root canals, and extractions.  Preventive care is also provided. Treatment is provided to both adults and children. Patients are selected via a lottery and there is often a waiting list.   [facility] [address] [place]  [phone] [email or web address]   [facility] [address] [place], [place] [phone], Ext. [phone] Second and Fourth Thursday of each month, opens at 6:30 AM; Clinic ends at 9 AM.  Patients are seen on a first-come first-served basis, and a limited number are seen during each clinic.   [facility]  [address] [place] [place], [place] [phone]   Eligibility Requirements You must have lived in [place], [place], or [place] counties for at least the last three months.   You cannot be eligible for state or federal sponsored [place], including [employer], [place], or [employer].   You generally cannot be eligible for healthcare insurance through your employer.    How to apply: Eligibility screenings are held every  Tuesday and Wednesday afternoon from 1:00 pm until 4:00 pm. You do not need an appointment for the interview!  °[facility] [address], [place], [place] [phone]   °[facility]  [phone]   °[facility]  [phone]   °[facility]  336-570-6415   ° °Behavioral Health Resources in the Community: °Intensive Outpatient Programs °Organization         Address                                              Phone              Notes  °[facility] [address], [place], [place] [phone]   °[facility] [address], [place], [place] [phone]   °[facility] [address], [place], [place] ° 336-882-2125   °[facility] [address],  °[place], [place] 1-800-853-5163 or 336-641-4981   °Substance Abuse Resources °Organization         Address                                 Phone  Notes  °[facility]  336-882-2125   °[facility]  336-784-9470   °[facility]  336-285-9073   °[facility]  336-845-3988   °Residential & Outpatient Substance Abuse Program  1-800-659-3381   °Psychological Services °Organization         Address                                  Phone                Notes  °[facility]  336- [phone]   °[facility]  336- 378-7881   °[facility] [address], [place] 1-800-853-5163 or 336-641-4981   ° °Mobile Crisis Teams °Organization         Address  Phone  Notes  °[facility], Mobile Crisis Care Unit  1-877-626-1772   °Assertive °Psychotherapeutic Services ° [address] [place], [place] 336-834-9664   °Sharon DeEsch [address] °[place] 336-554-5454   ° °Self-Help/Support Groups °Organization         Address                         Phone             Notes  °[facility] [place] - variety of support groups  336- 373-1402 Call for more information  °Narcotics Anonymous (NA), [facility] [address], °[place]  2 meetings at this location  ° °Residential Treatment Programs °Organization         Address                                                    Phone              Notes  °[facility]016   [address]    [place]  1-870-366-4748   [facility]  [address], [place] [address]4098, [place], [place]   [place], [place] [phone] Admissions: 8am-3pm M-F  Incentives Substance [place] [address],    [place], [place] [phone]   [facility] [address] [place], [place], [place]   [facility] [address],  [place], [place]   [facility] - Intensive Outpatient [address], [place], [place]   [facility].,  [place], [place] 1-908-870-5586 or [phone]   [facility] ([facility]) [address] [place], [place] Accepts Medicaid  [facility] [address]  [place] 1-(304)867-0010 Substance Abuse/Addiction Treatment   [facility] Organization         Address                                                            Phone                    Notes  [facility]  [phone]   Domenic Schwab, PhD [address] [place], [place]   [phone] or [phone]   [place] [address] [place], [place] [phone]   [facility] [address], [place], [place] [phone] Insurance/Medicaid/sponsorship through [facility] Families [address]                                    [place], [place] [phone] [place] [address], [place] [phone]    Dr. Adele Schilder  [phone]   [facility] Dept. 1) [address] [place] 2) [place] 3)  [address], [place] [phone]  [phone]   [place] [phone] or [phone] (After Hours)

## 2013-12-13 NOTE — ED Notes (Signed)
MD at bedside. 

## 2013-12-13 NOTE — ED Notes (Signed)
Noted "cyst" on right labia near leg crease two days ago.  Usually takes warm bath and goes away.  This one is very painful making it difficult to walk.

## 2013-12-14 NOTE — ED Provider Notes (Signed)
Medical screening examination/treatment/procedure(s) were performed by non-physician practitioner and as supervising physician I was immediately available for consultation/collaboration.   EKG Interpretation None        Julianne Rice, MD 12/14/13 [phone]

## 2014-03-26 ENCOUNTER — Encounter (HOSPITAL_COMMUNITY): Payer: Self-pay | Admitting: Emergency Medicine

## 2014-03-26 ENCOUNTER — Emergency Department (HOSPITAL_COMMUNITY)
Admission: EM | Admit: 2014-03-26 | Discharge: 2014-03-26 | Disposition: A | Discharge status: Home / Self Care | Payer: BC Managed Care – PPO | Attending: Emergency Medicine | Admitting: Emergency Medicine

## 2014-03-26 ENCOUNTER — Emergency Department (HOSPITAL_COMMUNITY): Payer: BC Managed Care – PPO

## 2014-03-26 DIAGNOSIS — Y929 Unspecified place or not applicable: Secondary | ICD-10-CM | POA: Diagnosis not present

## 2014-03-26 DIAGNOSIS — X500XXA Overexertion from strenuous movement or load, initial encounter: Secondary | ICD-10-CM | POA: Insufficient documentation

## 2014-03-26 DIAGNOSIS — S8990XA Unspecified injury of unspecified lower leg, initial encounter: Secondary | ICD-10-CM | POA: Insufficient documentation

## 2014-03-26 DIAGNOSIS — S99929A Unspecified injury of unspecified foot, initial encounter: Principal | ICD-10-CM

## 2014-03-26 DIAGNOSIS — Y9389 Activity, other specified: Secondary | ICD-10-CM | POA: Insufficient documentation

## 2014-03-26 DIAGNOSIS — S99919A Unspecified injury of unspecified ankle, initial encounter: Secondary | ICD-10-CM | POA: Diagnosis present

## 2014-03-26 DIAGNOSIS — M25562 Pain in left knee: Secondary | ICD-10-CM

## 2014-03-26 MED ORDER — MELOXICAM 15 MG PO TABS: 15.0000 mg | ORAL_TABLET | Freq: Every day | ORAL | Status: DC

## 2014-03-26 MED ORDER — TRAMADOL HCL 50 MG PO TABS: 50.0000 mg | ORAL_TABLET | Freq: Four times a day (QID) | ORAL | Status: DC | PRN

## 2014-03-26 NOTE — ED Notes (Signed)
Pt presents to department for evaluation of L knee pain. States she was attempting to get up from sitting on floor when she heard knee pop. 10/10 pain at the time. No obvious deformities noted. Pain increases with movement.

## 2014-03-26 NOTE — Discharge Instructions (Signed)
Please read and follow all provided instructions.  Your diagnoses today include:  1. Left knee pain     Tests performed today include:  An x-ray of the affected area - does NOT show any broken bones  Vital signs. See below for your results today.   Medications prescribed:   Meloxicam - anti-inflammatory pain medication  You have been prescribed an anti-inflammatory medication or NSAID. Take with food. Do not take aspirin, ibuprofen, or naproxen if taking this medication. Take smallest effective dose for the shortest duration needed for your pain. Stop taking if you experience stomach pain or vomiting.    Tramadol - narcotic-like pain medication  DO NOT drive or perform any activities that require you to be awake and alert because this medicine can make you drowsy.   Take any prescribed medications only as directed.  Home care instructions:   Follow any educational materials contained in this packet  Follow R.I.C.E. Protocol:  R - rest your injury   I  - use ice on injury without applying directly to skin  C - compress injury with bandage or splint  E - elevate the injury as much as possible  Follow-up instructions: Please follow-up with your primary care provider or the provided orthopedic physician (bone specialist) if you continue to have significant pain or trouble walking in 1 week. In this case you may have a severe injury that requires further care.   Return instructions:   Please return if your toes are numb or tingling, appear gray or blue, or you have severe pain (also elevate leg and loosen splint or wrap if you were given one)  Please return to the Emergency Department if you experience worsening symptoms.   Please return if you have any other emergent concerns.  Additional Information:  Your vital signs today were: BP 121/84   Pulse 73   Temp(Src) 98.8 F (37.1 C)   Resp 20   SpO2 100%   LMP 03/10/2014 If your blood pressure (BP) was elevated above  135/85 this visit, please have this repeated by your doctor within one month. -------------- If prescribed crutches for your injury: use crutches with non-weight bearing for the first few days. Then, you may walk as the pain allows, or as instructed. Start gradually with weight bearing on the affected side. Once you can walk pain free, then try jogging. When you can run forwards, then you can try moving side-to-side. If you cannot walk without crutches in one week, you need a re-check. --------------

## 2014-03-26 NOTE — ED Provider Notes (Signed)
CSN: [id number]     Arrival date & time 03/26/14  1459 History  This chart was scribed for non-physician practitioner working with Wandra Arthurs, MD by Mercy Moore, ED Scribe. This patient was seen in room TR11C/TR11C and the patient's care was started at 4:35 PM.   Chief Complaint  Patient presents with  . Knee Pain     The history is provided by the patient. No language interpreter was used.   HPI Comments: Kelly [name] is a 23 y.o. female who presents to the Emergency Department with a left knee injury that occurred yesterday. Patient states that she was kneeling on both knees for about 15 minutes while playing with a younger sibling. Patient states that she could not extend her knee to stand or walk and says that her brother had to help her up from the floor. Patient reports decreased range of motion due to pain. Pain is exacerbated with movement. Patient reports treatment with Motrin, with some relief of her pain. Previous left knee injury when running track one year ago.    History reviewed. No pertinent past medical history. History reviewed. No pertinent past surgical history. Family History  Problem Relation Age of Onset  . Diabetes Father   . Breast cancer Maternal Grandmother   . Pancreatic cancer Maternal Grandmother    History  Substance Use Topics  . Smoking status: Never Smoker   . Smokeless tobacco: Not on file  . Alcohol Use: No   OB History   Grav Para Term Preterm Abortions TAB SAB Ect Mult Living                 Review of Systems  Constitutional: Negative for fever, chills and activity change.  Musculoskeletal: Positive for arthralgias, gait problem and joint swelling. Negative for back pain and neck pain.  Skin: Negative for wound.  Neurological: Negative for weakness and numbness.      Allergies  Review of patient's allergies indicates no known allergies.  Home Medications   Prior to Admission medications   Not on File   Triage Vitals:  BP 116/74  Pulse 77  Temp(Src) 98.8 F (37.1 C)  Resp 20  SpO2 100%  LMP 03/10/2014 Physical Exam  Nursing note and vitals reviewed. Constitutional: She appears well-developed and well-nourished. No distress.  HENT:  Head: Normocephalic and atraumatic.  Eyes: EOM are normal. Pupils are equal, round, and reactive to light.  Neck: Normal range of motion. Neck supple.  Cardiovascular: Normal rate.  Exam reveals no decreased pulses.   Pulses:      Dorsalis pedis pulses are 2+ on the right side, and 2+ on the left side.       Posterior tibial pulses are 2+ on the right side, and 2+ on the left side.  Pulmonary/Chest: Effort normal. No respiratory distress.  Musculoskeletal: She exhibits tenderness. She exhibits no edema.       Left hip: Normal.       Left knee: She exhibits decreased range of motion (slight deficit in extension but can extend against gravity) and effusion (mild). Tenderness found. Medial joint line and lateral joint line tenderness noted.       Left ankle: Normal. Achilles tendon normal.  No crepitus with movement of knee  Neurological: She is alert. No sensory deficit.  Motor, sensation, and vascular distal to the injury is fully intact.   Skin: Skin is warm and dry.  Psychiatric: She has a normal mood and affect. Her behavior is normal.  ED Course  Procedures (including critical care time) COORDINATION OF TIME: 4:42 PM- Discussed treatment plan with patient at bedside and patient agreed to plan.   Labs Review Labs Reviewed - No data to display  Imaging Review Dg Knee Complete 4 Views Left  03/26/2014   CLINICAL DATA:  Knee pain beginning yesterday.  EXAM: LEFT KNEE - COMPLETE 4+ VIEW  COMPARISON:  None.  FINDINGS: There is no evidence of fracture, dislocation, or joint effusion. There is no evidence of arthropathy or other focal bone abnormality. Soft tissues are unremarkable.  IMPRESSION: Negative.   Electronically Signed   By: Logan Bores   On: 03/26/2014  16:15     EKG Interpretation None      4:56 PM Patient seen and examined. Pt informed of x-ray results. Crutches/immobilizer given. Medications ordered.   Vital signs reviewed and are as follows: Filed Vitals:   03/26/14 1628  BP: 121/84  Pulse: 73  Temp:   Resp:     MDM   Final diagnoses:  Left knee pain   Left knee injury -- x-ray neg. RICE indicated. No compartment syndrome. Ortho f/u indicated if not improved with conservative tx.   I personally performed the services described in this documentation, which was scribed in my presence. The recorded information has been reviewed and is accurate.    Carlisle Cater, PA-C 03/26/14 1657

## 2014-03-29 NOTE — ED Provider Notes (Signed)
Medical screening examination/treatment/procedure(s) were performed by non-physician practitioner and as supervising physician I was immediately available for consultation/collaboration.   EKG Interpretation None        Wandra Arthurs, MD 03/29/14 [phone]

## 2014-04-06 ENCOUNTER — Encounter: Payer: Self-pay | Admitting: Gynecology

## 2014-04-06 ENCOUNTER — Ambulatory Visit (INDEPENDENT_AMBULATORY_CARE_PROVIDER_SITE_OTHER): Payer: BC Managed Care – PPO | Admitting: Gynecology

## 2014-04-06 VITALS — BP 94/62 | HR 60 | Resp 12 | Ht 67.0 in | Wt 165.0 lb

## 2014-04-06 DIAGNOSIS — Z3009 Encounter for other general counseling and advice on contraception: Secondary | ICD-10-CM

## 2014-04-06 DIAGNOSIS — Z01419 Encounter for gynecological examination (general) (routine) without abnormal findings: Secondary | ICD-10-CM

## 2014-04-06 DIAGNOSIS — Z113 Encounter for screening for infections with a predominantly sexual mode of transmission: Secondary | ICD-10-CM

## 2014-04-06 DIAGNOSIS — Z124 Encounter for screening for malignant neoplasm of cervix: Secondary | ICD-10-CM

## 2014-04-06 DIAGNOSIS — Z0000 Encounter for general adult medical examination without abnormal findings: Secondary | ICD-10-CM

## 2014-04-06 LAB — POCT URINALYSIS DIPSTICK
LEUKOCYTES UA: NEGATIVE
UROBILINOGEN UA: NEGATIVE
pH, UA: 5

## 2014-04-06 LAB — HEMOGLOBIN, FINGERSTICK: HEMOGLOBIN, FINGERSTICK: 11.4 g/dL — AB (ref 12.0–16.0)

## 2014-04-06 NOTE — Progress Notes (Signed)
23 y.o. Single African American female   No obstetric history on file. here for annual exam. Pt is currently sexually active.  She reports using condoms on a regular basis.  First sexual activity at 23 years old, 5 number of lifetime partners.  Pt is not using contraception, occasional withdrawal occasional condom.  Pt has had mulitple sexual partners this year.  Pt is interested in STD screening.    Patient's last menstrual period was 03/10/2014.          Sexually active: Yes.    The current method of family planning is condoms sometimes.    Exercising: Yes.    run, light weight lifting every other day Last pap: never had one  Alcohol: no Tobacco: no Drugs: no Gardisil: yes age 70, completed: not sure   Hgb: 11.4 ; Urine: Negative   Health Maintenance  Topic Date Due  . Chlamydia Screening  04/16/2008  . Pap Smear  04/17/2011  . Tetanus/tdap  04/16/2012  . Influenza Vaccine  04/30/2014    Family History  Problem Relation Age of Onset  . Diabetes Father   . Breast cancer Maternal Grandmother   . Pancreatic cancer Maternal Grandmother     There are no active problems to display for this patient.   History reviewed. No pertinent past medical history.  History reviewed. No pertinent past surgical history.  Allergies: Review of patient's allergies indicates no known allergies.  No current outpatient prescriptions on file.   No current facility-administered medications for this visit.    ROS: Pertinent items are noted in HPI.  Exam:    BP 94/62  Pulse 60  Resp 12  Ht 5\' 7"  (1.702 m)  Wt 165 lb (74.844 kg)  BMI 25.84 kg/m2  LMP 03/10/2014 Weight change: @WEIGHTCHANGE @ Last 3 height recordings:  Ht Readings from Last 3 Encounters:  04/06/14 5\' 7"  (1.702 m)  12/13/13 5' 6.5" (1.689 m)  02/27/13 5\' 7"  (1.702 m) (86%*, Z = 1.06)   * Growth percentiles are based on CDC 2-20 Years data.   General appearance: alert, cooperative and appears stated age Head:  Normocephalic, without obvious abnormality, atraumatic Neck: no adenopathy, no carotid bruit, no JVD, supple, symmetrical, trachea midline and thyroid not enlarged, symmetric, no tenderness/mass/nodules Lungs: clear to auscultation bilaterally Breasts: normal appearance, no masses or tenderness Heart: regular rate and rhythm, S1, S2 normal, no murmur, click, rub or gallop Abdomen: soft, non-tender; bowel sounds normal; no masses,  no organomegaly Extremities: extremities normal, atraumatic, no cyanosis or edema Skin: Skin color, texture, turgor normal. No rashes or lesions Lymph nodes: Cervical, supraclavicular, and axillary nodes normal. no inguinal nodes palpated Neurologic: Grossly normal   Pelvic: External genitalia:  no lesions              Urethra: normal appearing urethra with no masses, tenderness or lesions              Bartholins and Skenes: Bartholin's, Urethra, Skene's normal                 Vagina: normal appearing vagina with normal color and discharge, no lesions, vagisil in vault              Cervix: normal appearance              Pap taken: Yes.          Bimanual Exam:  Uterus:  uterus is normal size, shape, consistency and nontender  Adnexa:    normal adnexa in size, nontender and no masses                                      Rectovaginal: Confirms                                      Anus:  normal sphincter tone, no lesions     1. Routine gynecological examination  counseled on breast self exam, STD prevention, HIV risk factors and prevention, feminine hygiene, use and side effects of OCP's, adequate intake of calcium and vitamin D, diet and exercise return annually or prn Discussed STD prevention, regular condom use.   2. Laboratory examination ordered as part of a routine general medical examination  - Hemoglobin, fingerstick - POCT urinalysis dipstick  3. Screen for STD (sexually transmitted disease) Stressed importance  of regular condom use - N. gonorrhoea and Chlamydia by PCR (IPS) - RPR - HIV antibody  4. Screening for cervical cancer Guidelines reviewed - PAP with Reflex to HPV (IPS)  5. Birth control counseling Contraceptive options-ocp, nexplanon and IUD discussed at length.  Risks and benefits.  Pt is leaning towards IUD, she is due for cycle but must wait for GC/CTM results before placement.  Risks of bleeding, infection perforation reviewed and accepted.  Will pretreat with cytotec for placement. Condoms and STD screening with partner change - IUD Insertion; Future    An After Visit Summary was printed and given to the patient.

## 2014-04-06 NOTE — Patient Instructions (Signed)
Etonogestrel implant What is this medicine? ETONOGESTREL (et oh noe JES trel) is a contraceptive (birth control) device. It is used to prevent pregnancy. It can be used for up to 3 years. This medicine may be used for other purposes; ask your health care provider or pharmacist if you have questions. COMMON BRAND NAME(S): Implanon, Nexplanon What should I tell my health care provider before I take this medicine? They need to know if you have any of these conditions: -abnormal vaginal bleeding -blood vessel disease or blood clots -cancer of the breast, cervix, or liver -depression -diabetes -gallbladder disease -headaches -heart disease or recent heart attack -high blood pressure -high cholesterol -kidney disease -liver disease -renal disease -seizures -tobacco smoker -an unusual or allergic reaction to etonogestrel, other hormones, anesthetics or antiseptics, medicines, foods, dyes, or preservatives -pregnant or trying to get pregnant -breast-feeding How should I use this medicine? This device is inserted just under the skin on the inner side of your upper arm by a health care professional. Talk to your pediatrician regarding the use of this medicine in children. Special care may be needed. Overdosage: If you think you've taken too much of this medicine contact a poison control center or emergency room at once. Overdosage: If you think you have taken too much of this medicine contact a poison control center or emergency room at once. NOTE: This medicine is only for you. Do not share this medicine with others. What if I miss a dose? This does not apply. What may interact with this medicine? Do not take this medicine with any of the following medications: -amprenavir -bosentan -fosamprenavir This medicine may also interact with the following medications: -barbiturate medicines for inducing sleep or treating seizures -certain medicines for fungal infections like ketoconazole and  itraconazole -griseofulvin -medicines to treat seizures like carbamazepine, felbamate, oxcarbazepine, phenytoin, topiramate -modafinil -phenylbutazone -rifampin -some medicines to treat HIV infection like atazanavir, indinavir, lopinavir, nelfinavir, tipranavir, ritonavir -St. John's wort This list may not describe all possible interactions. Give your health care provider a list of all the medicines, herbs, non-prescription drugs, or dietary supplements you use. Also tell them if you smoke, drink alcohol, or use illegal drugs. Some items may interact with your medicine. What should I watch for while using this medicine? This product does not protect you against HIV infection (AIDS) or other sexually transmitted diseases. You should be able to feel the implant by pressing your fingertips over the skin where it was inserted. Tell your doctor if you cannot feel the implant. What side effects may I notice from receiving this medicine? Side effects that you should report to your doctor or health care professional as soon as possible: -allergic reactions like skin rash, itching or hives, swelling of the face, lips, or tongue -breast lumps -changes in vision -confusion, trouble speaking or understanding -dark urine -depressed mood -general ill feeling or flu-like symptoms -light-colored stools -loss of appetite, nausea -right upper belly pain -severe headaches -severe pain, swelling, or tenderness in the abdomen -shortness of breath, chest pain, swelling in a leg -signs of pregnancy -sudden numbness or weakness of the face, arm or leg -trouble walking, dizziness, loss of balance or coordination -unusual vaginal bleeding, discharge -unusually weak or tired -yellowing of the eyes or skin Side effects that usually do not require medical attention (Report these to your doctor or health care professional if they continue or are bothersome.): -acne -breast pain -changes in  weight -cough -fever or chills -headache -irregular menstrual bleeding -itching, burning, and   vaginal discharge -pain or difficulty passing urine -sore throat This list may not describe all possible side effects. Call your doctor for medical advice about side effects. You may report side effects to FDA at 1-800-FDA-1088. Where should I keep my medicine? This drug is given in a hospital or clinic and will not be stored at home. NOTE: This sheet is a summary. It may not cover all possible information. If you have questions about this medicine, talk to your doctor, pharmacist, or health care provider.  2015, Elsevier/Gold Standard. (2012-03-23 15:37:45)    Levonorgestrel intrauterine device (IUD) What is this medicine? LEVONORGESTREL IUD (LEE voe nor jes trel) is a contraceptive (birth control) device. The device is placed inside the uterus by a healthcare professional. It is used to prevent pregnancy and can also be used to treat heavy bleeding that occurs during your period. Depending on the device, it can be used for 3 to 5 years. This medicine may be used for other purposes; ask your health care provider or pharmacist if you have questions. COMMON BRAND NAME(S): Jerral Bonito What should I tell my health care provider before I take this medicine? They need to know if you have any of these conditions: -abnormal Pap smear -cancer of the breast, uterus, or cervix -diabetes -endometritis -genital or pelvic infection now or in the past -have more than one sexual partner or your partner has more than one partner -heart disease -history of an ectopic or tubal pregnancy -immune system problems -IUD in place -liver disease or tumor -problems with blood clots or take blood-thinners -use intravenous drugs -uterus of unusual shape -vaginal bleeding that has not been explained -an unusual or allergic reaction to levonorgestrel, other hormones, silicone, or polyethylene, medicines, foods,  dyes, or preservatives -pregnant or trying to get pregnant -breast-feeding How should I use this medicine? This device is placed inside the uterus by a health care professional. Talk to your pediatrician regarding the use of this medicine in children. Special care may be needed. Overdosage: If you think you have taken too much of this medicine contact a poison control center or emergency room at once. NOTE: This medicine is only for you. Do not share this medicine with others. What if I miss a dose? This does not apply. What may interact with this medicine? Do not take this medicine with any of the following medications: -amprenavir -bosentan -fosamprenavir This medicine may also interact with the following medications: -aprepitant -barbiturate medicines for inducing sleep or treating seizures -bexarotene -griseofulvin -medicines to treat seizures like carbamazepine, ethotoin, felbamate, oxcarbazepine, phenytoin, topiramate -modafinil -pioglitazone -rifabutin -rifampin -rifapentine -some medicines to treat HIV infection like atazanavir, indinavir, lopinavir, nelfinavir, tipranavir, ritonavir -St. John's wort -warfarin This list may not describe all possible interactions. Give your health care provider a list of all the medicines, herbs, non-prescription drugs, or dietary supplements you use. Also tell them if you smoke, drink alcohol, or use illegal drugs. Some items may interact with your medicine. What should I watch for while using this medicine? Visit your doctor or health care professional for regular check ups. See your doctor if you or your partner has sexual contact with others, becomes HIV positive, or gets a sexual transmitted disease. This product does not protect you against HIV infection (AIDS) or other sexually transmitted diseases. You can check the placement of the IUD yourself by reaching up to the top of your vagina with clean fingers to feel the threads. Do not  pull on the threads. It is  a good habit to check placement after each menstrual period. Call your doctor right away if you feel more of the IUD than just the threads or if you cannot feel the threads at all. The IUD may come out by itself. You may become pregnant if the device comes out. If you notice that the IUD has come out use a backup birth control method like condoms and call your health care provider. Using tampons will not change the position of the IUD and are okay to use during your period. What side effects may I notice from receiving this medicine? Side effects that you should report to your doctor or health care professional as soon as possible: -allergic reactions like skin rash, itching or hives, swelling of the face, lips, or tongue -fever, flu-like symptoms -genital sores -high blood pressure -no menstrual period for 6 weeks during use -pain, swelling, warmth in the leg -pelvic pain or tenderness -severe or sudden headache -signs of pregnancy -stomach cramping -sudden shortness of breath -trouble with balance, talking, or walking -unusual vaginal bleeding, discharge -yellowing of the eyes or skin Side effects that usually do not require medical attention (report to your doctor or health care professional if they continue or are bothersome): -acne -breast pain -change in sex drive or performance -changes in weight -cramping, dizziness, or faintness while the device is being inserted -headache -irregular menstrual bleeding within first 3 to 6 months of use -nausea This list may not describe all possible side effects. Call your doctor for medical advice about side effects. You may report side effects to FDA at 1-800-FDA-1088. Where should I keep my medicine? This does not apply. NOTE: This sheet is a summary. It may not cover all possible information. If you have questions about this medicine, talk to your doctor, pharmacist, or health care provider.  2015,  Elsevier/Gold Standard. (2011-10-17 13:54:04)

## 2014-04-07 LAB — HIV ANTIBODY (ROUTINE TESTING W REFLEX): HIV 1&2 Ab, 4th Generation: NONREACTIVE

## 2014-04-07 LAB — RPR

## 2014-04-08 LAB — IPS N GONORRHOEA AND CHLAMYDIA BY PCR

## 2014-04-11 ENCOUNTER — Telehealth: Payer: Self-pay | Admitting: Gynecology

## 2014-04-11 ENCOUNTER — Encounter (HOSPITAL_COMMUNITY): Payer: Self-pay | Admitting: Emergency Medicine

## 2014-04-11 ENCOUNTER — Emergency Department (HOSPITAL_COMMUNITY)
Admission: EM | Admit: 2014-04-11 | Discharge: 2014-04-12 | Disposition: A | Discharge status: Home / Self Care | Payer: 59 | Attending: Emergency Medicine | Admitting: Emergency Medicine

## 2014-04-11 DIAGNOSIS — T50991A Poisoning by other drugs, medicaments and biological substances, accidental (unintentional), initial encounter: Secondary | ICD-10-CM | POA: Insufficient documentation

## 2014-04-11 DIAGNOSIS — T50992A Poisoning by other drugs, medicaments and biological substances, intentional self-harm, initial encounter: Secondary | ICD-10-CM | POA: Insufficient documentation

## 2014-04-11 DIAGNOSIS — T438X2A Poisoning by other psychotropic drugs, intentional self-harm, initial encounter: Secondary | ICD-10-CM | POA: Insufficient documentation

## 2014-04-11 DIAGNOSIS — T50902A Poisoning by unspecified drugs, medicaments and biological substances, intentional self-harm, initial encounter: Secondary | ICD-10-CM

## 2014-04-11 DIAGNOSIS — F329 Major depressive disorder, single episode, unspecified: Secondary | ICD-10-CM | POA: Diagnosis not present

## 2014-04-11 DIAGNOSIS — T43502A Poisoning by unspecified antipsychotics and neuroleptics, intentional self-harm, initial encounter: Secondary | ICD-10-CM | POA: Insufficient documentation

## 2014-04-11 DIAGNOSIS — T43591A Poisoning by other antipsychotics and neuroleptics, accidental (unintentional), initial encounter: Secondary | ICD-10-CM | POA: Diagnosis present

## 2014-04-11 DIAGNOSIS — R45851 Suicidal ideations: Secondary | ICD-10-CM

## 2014-04-11 DIAGNOSIS — F411 Generalized anxiety disorder: Secondary | ICD-10-CM | POA: Insufficient documentation

## 2014-04-11 DIAGNOSIS — F3110 Bipolar disorder, current episode manic without psychotic features, unspecified: Secondary | ICD-10-CM

## 2014-04-11 DIAGNOSIS — Z3202 Encounter for pregnancy test, result negative: Secondary | ICD-10-CM | POA: Diagnosis not present

## 2014-04-11 DIAGNOSIS — F3289 Other specified depressive episodes: Secondary | ICD-10-CM | POA: Insufficient documentation

## 2014-04-11 DIAGNOSIS — F319 Bipolar disorder, unspecified: Secondary | ICD-10-CM

## 2014-04-11 DIAGNOSIS — F4323 Adjustment disorder with mixed anxiety and depressed mood: Secondary | ICD-10-CM | POA: Diagnosis present

## 2014-04-11 LAB — RAPID URINE DRUG SCREEN, HOSP PERFORMED
Amphetamines: NOT DETECTED
Barbiturates: NOT DETECTED
Benzodiazepines: NOT DETECTED
Cocaine: NOT DETECTED
Opiates: NOT DETECTED
Tetrahydrocannabinol: NOT DETECTED

## 2014-04-11 LAB — ETHANOL: Alcohol, Ethyl (B): <11 mg/dL (ref 0–11)

## 2014-04-11 LAB — COMPREHENSIVE METABOLIC PANEL
ALT: 10 U/L (ref 0–35)
AST: 16 U/L (ref 0–37)
Albumin: 4.1 g/dL (ref 3.5–5.2)
Alkaline Phosphatase: 41 U/L (ref 39–117)
Anion gap: 12 (ref 5–15)
BUN: 8 mg/dL (ref 6–23)
CO2: 24 mEq/L (ref 19–32)
Calcium: 9.5 mg/dL (ref 8.4–10.5)
Chloride: 103 mEq/L (ref 96–112)
Creatinine, Ser: 0.84 mg/dL (ref 0.50–1.10)
GFR calc Af Amer: >90 mL/min (ref >90)
GFR calc non Af Amer: >90 mL/min (ref >90)
Glucose, Bld: 96 mg/dL (ref 70–99)
Potassium: 3.7 mEq/L (ref 3.7–5.3)
Sodium: 139 mEq/L (ref 137–147)
Total Bilirubin: 0.5 mg/dL (ref 0.3–1.2)
Total Protein: 7.3 g/dL (ref 6.0–8.3)

## 2014-04-11 LAB — CBC
HCT: 36.4 % (ref 36.0–46.0)
Hemoglobin: 12.1 g/dL (ref 12.0–15.0)
MCH: 27.9 pg (ref 26.0–34.0)
MCHC: 33.2 g/dL (ref 30.0–36.0)
MCV: 84.1 fL (ref 78.0–100.0)
Platelets: 259 10*3/uL (ref 150–400)
RBC: 4.33 MIL/uL (ref 3.87–5.11)
RDW: 12.1 % (ref 11.5–15.5)
WBC: 6.4 10*3/uL (ref 4.0–10.5)

## 2014-04-11 LAB — IPS PAP TEST WITH REFLEX TO HPV

## 2014-04-11 LAB — PREGNANCY, URINE: PREG TEST UR: NEGATIVE

## 2014-04-11 LAB — ACETAMINOPHEN LEVEL: Acetaminophen (Tylenol), Serum: <15 ug/mL (ref 10–30)

## 2014-04-11 LAB — SALICYLATE LEVEL: Salicylate Lvl: <2 mg/dL — ABNORMAL LOW (ref 2.8–20.0)

## 2014-04-11 MED ORDER — LORAZEPAM 1 MG PO TABS: 1.0000 mg | ORAL_TABLET | Freq: Four times a day (QID) | ORAL | Status: DC | PRN

## 2014-04-11 MED ORDER — IBUPROFEN 800 MG PO TABS
800.0000 mg | ORAL_TABLET | Freq: Once | ORAL | Status: AC
  Administered 2014-04-11: 800 mg via ORAL
  Filled 2014-04-11: qty 1

## 2014-04-11 MED ORDER — ACETAMINOPHEN 500 MG PO TABS: 1000.0000 mg | ORAL_TABLET | ORAL | Status: DC | PRN

## 2014-04-11 MED ORDER — DIPHENHYDRAMINE HCL 25 MG PO CAPS
25.0000 mg | ORAL_CAPSULE | ORAL | Status: DC | PRN
Start: 2014-04-11 — End: 2014-04-12

## 2014-04-11 NOTE — ED Provider Notes (Addendum)
CSN: [id number]     Arrival date & time 04/11/14  0524 History   First MD Initiated Contact with Patient 04/11/14 0557     Chief Complaint  Patient presents with  . Drug Overdose     (Consider location/radiation/quality/duration/timing/severity/associated sxs/prior Treatment) HPI This is a 23 year old female who took 1-5 trazodone tablets and 10+ hydroxyzine tablets this morning about 1:30 in a suicide attempt. This was on the occasion of the one-year anniversary of the death of her father. She admits to being depressed recently. After she took the pills she became very anxious, felt her heart beating rapidly and began breathing rapidly. She had thoughts of her suicide being devastating to her family so she decided to call for help. She is denying physical pain or complaint at the present time apart from feeling very drowsy. She does admit to ongoing and for pain.  History reviewed. No pertinent past medical history. History reviewed. No pertinent past surgical history. Family History  Problem Relation Age of Onset  . Diabetes Father   . Breast cancer Maternal Grandmother   . Pancreatic cancer Maternal Grandmother    History  Substance Use Topics  . Smoking status: Never Smoker   . Smokeless tobacco: Never Used  . Alcohol Use: Yes     Comment: Occasional   OB History   Grav Para Term Preterm Abortions TAB SAB Ect Mult Living                 Review of Systems  All other systems reviewed and are negative.   Allergies  Review of patient's allergies indicates no known allergies.  Home Medications   Prior to Admission medications   Not on File   BP 108/64  Pulse 95  Temp(Src) 97.5 F (36.4 C) (Oral)  Resp 18  SpO2 100%  LMP 03/10/2014  Physical Exam General: Well-developed, well-nourished female in no acute distress; appearance consistent with age of record HENT: normocephalic; atraumatic Eyes: pupils equal, round and pinpoint; extraocular muscles intact Neck:  supple Heart: regular rate and rhythm Lungs: clear to auscultation bilaterally Abdomen: soft; nondistended; nontender; no masses or hepatosplenomegaly; bowel sounds present Extremities: No deformity; full range of motion; pulses normal Neurologic: Awake, alert and oriented; motor function intact in all extremities and symmetric; no facial droop Skin: Warm and dry Psychiatric: +SI; +depression   ED Course  Procedures (including critical care time)  MDM  Nursing notes and vitals signs, including pulse oximetry, reviewed.  Summary of this visit's results, reviewed by myself:  Labs:  Results for orders placed during the hospital encounter of 04/11/14 (from the past 24 hour(s))  URINE RAPID DRUG SCREEN (HOSP PERFORMED)     Status: None   Collection Time    04/11/14  5:51 AM      Result Value Ref Range   Opiates NONE DETECTED  NONE DETECTED   Cocaine NONE DETECTED  NONE DETECTED   Benzodiazepines NONE DETECTED  NONE DETECTED   Amphetamines NONE DETECTED  NONE DETECTED   Tetrahydrocannabinol NONE DETECTED  NONE DETECTED   Barbiturates NONE DETECTED  NONE DETECTED  PREGNANCY, URINE     Status: None   Collection Time    04/11/14  6:09 AM      Result Value Ref Range   Preg Test, Ur NEGATIVE  NEGATIVE  CBC     Status: None   Collection Time    04/11/14  6:35 AM      Result Value Ref Range   WBC 6.4  4.0 - 10.5 K/uL   RBC 4.33  3.87 - 5.11 MIL/uL   Hemoglobin 12.1  12.0 - 15.0 g/dL   HCT 36.4  36.0 - 46.0 %   MCV 84.1  78.0 - 100.0 fL   MCH 27.9  26.0 - 34.0 pg   MCHC 33.2  30.0 - 36.0 g/dL   RDW 12.1  11.5 - 15.5 %   Platelets 259  150 - 400 K/uL  COMPREHENSIVE METABOLIC PANEL     Status: None   Collection Time    04/11/14  6:35 AM      Result Value Ref Range   Sodium 139  137 - 147 mEq/L   Potassium 3.7  3.7 - 5.3 mEq/L   Chloride 103  96 - 112 mEq/L   CO2 24  19 - 32 mEq/L   Glucose, Bld 96  70 - 99 mg/dL   BUN 8  6 - 23 mg/dL   Creatinine, Ser 0.84  0.50 - 1.10 mg/dL    Calcium 9.5  8.4 - 10.5 mg/dL   Total Protein 7.3  6.0 - 8.3 g/dL   Albumin 4.1  3.5 - 5.2 g/dL   AST 16  0 - 37 U/L   ALT 10  0 - 35 U/L   Alkaline Phosphatase 41  39 - 117 U/L   Total Bilirubin 0.5  0.3 - 1.2 mg/dL   GFR calc non Af Amer >90  >90 mL/min   GFR calc Af Amer >90  >90 mL/min   Anion gap 12  5 - 15  ETHANOL     Status: None   Collection Time    04/11/14  6:35 AM      Result Value Ref Range   Alcohol, Ethyl (B) <11  0 - 11 mg/dL  ACETAMINOPHEN LEVEL     Status: None   Collection Time    04/11/14  6:35 AM      Result Value Ref Range   Acetaminophen (Tylenol), Serum <15.0  10 - 30 ug/mL  SALICYLATE LEVEL     Status: Abnormal   Collection Time    04/11/14  6:35 AM      Result Value Ref Range   Salicylate Lvl <9.4 (*) 2.8 - 20.0 mg/dL       Wynetta Fines, MD 04/11/14 0715  Wynetta Fines, MD 04/11/14 7096  Wynetta Fines, MD 04/11/14 1008

## 2014-04-11 NOTE — ED Notes (Addendum)
Pt instructed we need another urine sample. Pt provided with ice chips  Pt alert and oriented x4. Respirations even and unlabored, bilateral symmetrical rise and fall of chest. Skin warm and dry. In no acute distress. Denies needs.

## 2014-04-11 NOTE — ED Notes (Signed)
Poison control called to check on patient. Given update. Pt will be closed out of their system.

## 2014-04-11 NOTE — ED Notes (Signed)
Poison control contacted about ingestion.  Per [name] If no anticholinergic side effects noted at this time will most likely not see any adverse side effects.

## 2014-04-11 NOTE — ED Notes (Signed)
Per psych md pt needs inpatient care for SI.   Sitter at bedside

## 2014-04-11 NOTE — BH Assessment (Signed)
[facility] Assessment Progress Note      Received a consult to TTS.  Spoke to Dr Wilson Singer to obtain clinical information.  Pt's father died a year ago and in honor of that tried to overdose to end her life.  Later she realized that she didn't want to put her family through that so she came to the hospital for assistance.

## 2014-04-11 NOTE — Consult Note (Signed)
[facility] Face-to-Face Psychiatry Consult   Reason for Consult:  Drug overdose Referring Physician:  EDP  Kelly [name] is an 23 y.o. female. Total Time spent with patient: 20 minutes  Assessment: AXIS I:  Bipolar, Manic AXIS II:  Deferred AXIS III:  History reviewed. No pertinent past medical history. AXIS IV:  other psychosocial or environmental problems, problems related to social environment and problems with primary support group AXIS V:  31-40 impairment in reality testing  Plan:  Recommend psychiatric Inpatient admission when medically cleared.  Subjective:   Kelly [name] is a 23 y.o. female patient admitted with drug overdose.  HPI:  Patient is 23 year old African American female who presented to the emergency room after taking overdose on trazodone and hydroxyzine.  Patient told it was a suicidal attempt.  She was going through a lot of emotions and distress.  She recently broke up with her boyfriend 4 she knows only for 2 weeks.  She also mentioned that last night was the death anniversary of her father who deceased 9 years ago.  Patient told that lately she has been having a lot of mood swings, irritability, poor impulse control and impulsive behavior.  She admitted having addiction with sex and love and she could not resist for above at any cost.  She's been involved in multiple relationship which usually last for a few weeks.  Patient endorsed that she is making poor judgment in her life and she has no control.  She endorsed her mind goes very fast and if she continues to act like this she may kill herself one day.  Patient is frustrated with her highs and lows and she decided to end her life last night.  Her son has chronic mental illness who is currently admitted at [facility].  Patient or her mother has limited insight into mental illness and she does not believe that she is suffering from a psychotic illness.  Patient is working in a call center for [employer].  Patient appears very emotional, her speech is pressured, fast.  She endorsed having grandiosity and sometimes imagine herself as a movie star and believes she has a lot of talents.  She is afraid to tell her thinking to her mother because she does not believe in psychiatric illness.  Patient also endorsed that she is obsessed about getting STD and HIV because of unprotected sex .  She had spent a lot of money for these test .  Patient feels that she needs help and she is willing to come in patient voluntary.   Past Psychiatric History: History reviewed. No pertinent past medical history.  reports that she has never smoked. She has never used smokeless tobacco. She reports that she drinks alcohol. She reports that she does not use illicit drugs. Family History  Problem Relation Age of Onset  . Diabetes Father   . Breast cancer Maternal Grandmother   . Pancreatic cancer Maternal Grandmother            Allergies:  No Known Allergies  ACT Assessment Complete:  Yes:    Educational Status    Risk to Self: Risk to self Is patient at risk for suicide?: Yes Substance abuse history and/or treatment for substance abuse?: No  Risk to Others:    Abuse:    Prior Inpatient Therapy:    Prior Outpatient Therapy:    Additional Information:  Objective: Blood pressure 120/70, pulse 75, temperature 98.3 F (36.8 C), temperature source Oral, resp. rate 16, last menstrual period 03/10/2014, SpO2 100.00%.There is no weight on file to calculate BMI. Results for orders placed during the hospital encounter of 04/11/14 (from the past 72 hour(s))  URINE RAPID DRUG SCREEN (HOSP PERFORMED)     Status: None   Collection Time    04/11/14  5:51 AM      Result Value Ref Range   Opiates NONE DETECTED  NONE DETECTED   Cocaine NONE DETECTED  NONE DETECTED   Benzodiazepines NONE DETECTED  NONE DETECTED   Amphetamines NONE DETECTED  NONE DETECTED   Tetrahydrocannabinol NONE  DETECTED  NONE DETECTED   Barbiturates NONE DETECTED  NONE DETECTED   Comment:            DRUG SCREEN FOR MEDICAL PURPOSES     ONLY.  IF CONFIRMATION IS NEEDED     FOR ANY PURPOSE, NOTIFY LAB     WITHIN 5 DAYS.                LOWEST DETECTABLE LIMITS     FOR URINE DRUG SCREEN     Drug Class       Cutoff (ng/mL)     Amphetamine      1000     Barbiturate      200     Benzodiazepine   664     Tricyclics       403     Opiates          300     Cocaine          300     THC              50  PREGNANCY, URINE     Status: None   Collection Time    04/11/14  6:09 AM      Result Value Ref Range   Preg Test, Ur NEGATIVE  NEGATIVE   Comment:            THE SENSITIVITY OF THIS     METHODOLOGY IS >20 mIU/mL.  CBC     Status: None   Collection Time    04/11/14  6:35 AM      Result Value Ref Range   WBC 6.4  4.0 - 10.5 K/uL   RBC 4.33  3.87 - 5.11 MIL/uL   Hemoglobin 12.1  12.0 - 15.0 g/dL   HCT 36.4  36.0 - 46.0 %   MCV 84.1  78.0 - 100.0 fL   MCH 27.9  26.0 - 34.0 pg   MCHC 33.2  30.0 - 36.0 g/dL   RDW 12.1  11.5 - 15.5 %   Platelets 259  150 - 400 K/uL  COMPREHENSIVE METABOLIC PANEL     Status: None   Collection Time    04/11/14  6:35 AM      Result Value Ref Range   Sodium 139  137 - 147 mEq/L   Potassium 3.7  3.7 - 5.3 mEq/L   Chloride 103  96 - 112 mEq/L   CO2 24  19 - 32 mEq/L   Glucose, Bld 96  70 - 99 mg/dL   BUN 8  6 - 23 mg/dL   Creatinine, Ser 0.84  0.50 - 1.10 mg/dL   Calcium 9.5  8.4 - 10.5 mg/dL   Total Protein 7.3  6.0 - 8.3 g/dL   Albumin 4.1  3.5 - 5.2 g/dL  AST 16  0 - 37 U/L   ALT 10  0 - 35 U/L   Alkaline Phosphatase 41  39 - 117 U/L   Total Bilirubin 0.5  0.3 - 1.2 mg/dL   GFR calc non Af Amer >90  >90 mL/min   GFR calc Af Amer >90  >90 mL/min   Comment: (NOTE)     The eGFR has been calculated using the CKD EPI equation.     This calculation has not been validated in all clinical situations.     eGFR's persistently <90 mL/min signify possible Chronic  Kidney     Disease.   Anion gap 12  5 - 15  ETHANOL     Status: None   Collection Time    04/11/14  6:35 AM      Result Value Ref Range   Alcohol, Ethyl (B) <11  0 - 11 mg/dL   Comment:            LOWEST DETECTABLE LIMIT FOR     SERUM ALCOHOL IS 11 mg/dL     FOR MEDICAL PURPOSES ONLY  ACETAMINOPHEN LEVEL     Status: None   Collection Time    04/11/14  6:35 AM      Result Value Ref Range   Acetaminophen (Tylenol), Serum <15.0  10 - 30 ug/mL   Comment:            THERAPEUTIC CONCENTRATIONS VARY     SIGNIFICANTLY. A RANGE OF 10-30     ug/mL MAY BE AN EFFECTIVE     CONCENTRATION FOR MANY PATIENTS.     HOWEVER, SOME ARE BEST TREATED     AT CONCENTRATIONS OUTSIDE THIS     RANGE.     ACETAMINOPHEN CONCENTRATIONS     >150 ug/mL AT 4 HOURS AFTER     INGESTION AND >50 ug/mL AT 12     HOURS AFTER INGESTION ARE     OFTEN ASSOCIATED WITH TOXIC     REACTIONS.  SALICYLATE LEVEL     Status: Abnormal   Collection Time    04/11/14  6:35 AM      Result Value Ref Range   Salicylate Lvl <8.5 (*) 2.8 - 20.0 mg/dL   Labs are reviewed.  No current facility-administered medications for this encounter.   No current outpatient prescriptions on file.    Psychiatric Specialty Exam:     Blood pressure 120/70, pulse 75, temperature 98.3 F (36.8 C), temperature source Oral, resp. rate 16, last menstrual period 03/10/2014, SpO2 100.00%.There is no weight on file to calculate BMI.  General Appearance: Guarded  Eye Contact::  Fair  Speech:  Pressured  Volume:  Increased  Mood:  Euphoric and Irritable  Affect:  Inappropriate, Labile and Tearful  Thought Process:  Loose and Flight of ideas  Orientation:  Full (Time, Place, and Person)  Thought Content:  Delusions, Obsessions, Paranoid Ideation and Grandiosity and imagination  Suicidal Thoughts:  Yes.  with intent/plan  Homicidal Thoughts:  No  Memory:  Immediate;   Fair Recent;   Fair Remote;   Fair  Judgement:  Impaired  Insight:   Lacking  Psychomotor Activity:  Increased  Concentration:  Poor  Recall:  [place]: Fair  Akathisia:  No  Handed:  Right  AIMS (if indicated):     Assets:  Communication Skills Desire for Improvement Social Support  Sleep:      Musculoskeletal: Strength & Muscle Tone: within normal limits Gait &  Station: Patient is lying on the bed Patient leans: N/A  Treatment Plan Summary: Patient requires inpatient psychiatric treatment. Once medically cleared transfer to behavioral [place] for inpatient services.  Teaira Croft T. 04/11/2014 3:39 PM

## 2014-04-11 NOTE — ED Notes (Signed)
Bed: RESB Expected date: 04/11/14 Expected time: 5:14 AM Means of arrival: Ambulance Comments: overdose

## 2014-04-11 NOTE — ED Notes (Signed)
Pt Belonging Bag x 1 placed in [place] [address].

## 2014-04-11 NOTE — ED Notes (Signed)
Patient and belongings have been wanded by security. Patient belongings are clothes, pocketbook with cell phone. Bag is at nursing station till patient moved to [facility]

## 2014-04-11 NOTE — ED Notes (Signed)
Per EMS pt took between 1-5 tramadol and approximately 10 hydroxyzine.  This is the anniversary of her father's death and it was a suicide attempt.  Pt is A&O x3.  Only abnormal finding per EMS was pinpoint pupils.  Pt c/o tachycardia and SOB upon waking up.

## 2014-04-11 NOTE — ED Notes (Signed)
Psych at bedside.

## 2014-04-11 NOTE — ED Notes (Signed)
Pt states that she feels sleepy denies any other c/o at this time.  Pt did report that the ingestion of tramadol and hydroxyzine was a suicide attempt.  No past attempts made.

## 2014-04-11 NOTE — Telephone Encounter (Signed)
Left message for patient to call back. Need to go over IUD benefits °

## 2014-04-11 NOTE — ED Notes (Signed)
Pt allowed to use her phone to call her work to let them know she will miss work today.

## 2014-04-12 ENCOUNTER — Encounter (HOSPITAL_COMMUNITY): Payer: Self-pay | Admitting: Registered Nurse

## 2014-04-12 ENCOUNTER — Other Ambulatory Visit: Payer: Self-pay | Admitting: Gynecology

## 2014-04-12 ENCOUNTER — Telehealth: Payer: Self-pay | Admitting: Emergency Medicine

## 2014-04-12 DIAGNOSIS — F4323 Adjustment disorder with mixed anxiety and depressed mood: Secondary | ICD-10-CM

## 2014-04-12 DIAGNOSIS — IMO0002 Reserved for concepts with insufficient information to code with codable children: Secondary | ICD-10-CM

## 2014-04-12 HISTORY — DX: Adjustment disorder with mixed anxiety and depressed mood: F43.23

## 2014-04-12 MED ORDER — ESCITALOPRAM OXALATE 10 MG PO TABS: 5.0000 mg | ORAL_TABLET | Freq: Every day | ORAL | Status: DC

## 2014-04-12 MED ORDER — ESCITALOPRAM OXALATE 5 MG PO TABS: 5.0000 mg | ORAL_TABLET | Freq: Every day | ORAL | Status: DC

## 2014-04-12 NOTE — Consult Note (Signed)
[facility] Face-to-Face Psychiatry Consult   Reason for Consult:  Drug overdose Referring Physician:  EDP  Kelly [name] is an 23 y.o. female. Total Time spent with patient: 20 minutes  Assessment: AXIS I:  Bipolar, Manic AXIS II:  Deferred AXIS III:  History reviewed. No pertinent past medical history. AXIS IV:  other psychosocial or environmental problems, problems related to social environment and problems with primary support group AXIS V:  31-40 impairment in reality testing  Plan:  Recommend psychiatric Inpatient admission when medically cleared.  Subjective:   Kelly [name] is a 23 y.o. female patient admitted with drug overdose.  HPI: Patient states "I am craving to fill this space; this empty space left when my father died.  I lost my virginity at the age of 27 and I have slept with 20 guys and I'm only 89.  The relationships only last about a month or so but I feel like I have to be with them; like I really love and need to be with them."  Patient states that she has had depressive feeling in the past and has never acted on then.  States that when she took her brothers  medicine she wasn't thinking clearly.  "I don't want to die."  Patient denies suicidal/homicidal ideation, psychosis, and paranoia. Patient works full time and lives a lone. Patient has a brother who has mental health issues and has been in and out of the hospital since he was 21 yr old.  Patient bother is now living with her.     Past Psychiatric History: History reviewed. No pertinent past medical history.  reports that she has never smoked. She has never used smokeless tobacco. She reports that she drinks alcohol. She reports that she does not use illicit drugs. Family History  Problem Relation Age of Onset  . Diabetes Father   . Breast cancer Maternal Grandmother   . Pancreatic cancer Maternal Grandmother            Allergies:  No Known Allergies  ACT Assessment Complete:  Yes:     Educational Status    Risk to Self: Risk to self Is patient at risk for suicide?: Yes Substance abuse history and/or treatment for substance abuse?: No  Risk to Others:    Abuse:    Prior Inpatient Therapy:    Prior Outpatient Therapy:    Additional Information:                    Objective: Blood pressure 103/67, pulse 69, temperature 98.2 F (36.8 C), temperature source Oral, resp. rate 18, last menstrual period 03/10/2014, SpO2 99.00%.There is no weight on file to calculate BMI. Results for orders placed during the hospital encounter of 04/11/14 (from the past 72 hour(s))  URINE RAPID DRUG SCREEN (HOSP PERFORMED)     Status: None   Collection Time    04/11/14  5:51 AM      Result Value Ref Range   Opiates NONE DETECTED  NONE DETECTED   Cocaine NONE DETECTED  NONE DETECTED   Benzodiazepines NONE DETECTED  NONE DETECTED   Amphetamines NONE DETECTED  NONE DETECTED   Tetrahydrocannabinol NONE DETECTED  NONE DETECTED   Barbiturates NONE DETECTED  NONE DETECTED   Comment:            DRUG SCREEN FOR MEDICAL PURPOSES     ONLY.  IF CONFIRMATION IS NEEDED     FOR ANY PURPOSE, NOTIFY LAB     WITHIN 5 DAYS.  LOWEST DETECTABLE LIMITS     FOR URINE DRUG SCREEN     Drug Class       Cutoff (ng/mL)     Amphetamine      1000     Barbiturate      200     Benzodiazepine   725     Tricyclics       366     Opiates          300     Cocaine          300     THC              50  PREGNANCY, URINE     Status: None   Collection Time    04/11/14  6:09 AM      Result Value Ref Range   Preg Test, Ur NEGATIVE  NEGATIVE   Comment:            THE SENSITIVITY OF THIS     METHODOLOGY IS >20 mIU/mL.  CBC     Status: None   Collection Time    04/11/14  6:35 AM      Result Value Ref Range   WBC 6.4  4.0 - 10.5 K/uL   RBC 4.33  3.87 - 5.11 MIL/uL   Hemoglobin 12.1  12.0 - 15.0 g/dL   HCT 36.4  36.0 - 46.0 %   MCV 84.1  78.0 - 100.0 fL   MCH 27.9  26.0 - 34.0 pg    MCHC 33.2  30.0 - 36.0 g/dL   RDW 12.1  11.5 - 15.5 %   Platelets 259  150 - 400 K/uL  COMPREHENSIVE METABOLIC PANEL     Status: None   Collection Time    04/11/14  6:35 AM      Result Value Ref Range   Sodium 139  137 - 147 mEq/L   Potassium 3.7  3.7 - 5.3 mEq/L   Chloride 103  96 - 112 mEq/L   CO2 24  19 - 32 mEq/L   Glucose, Bld 96  70 - 99 mg/dL   BUN 8  6 - 23 mg/dL   Creatinine, Ser 0.84  0.50 - 1.10 mg/dL   Calcium 9.5  8.4 - 10.5 mg/dL   Total Protein 7.3  6.0 - 8.3 g/dL   Albumin 4.1  3.5 - 5.2 g/dL   AST 16  0 - 37 U/L   ALT 10  0 - 35 U/L   Alkaline Phosphatase 41  39 - 117 U/L   Total Bilirubin 0.5  0.3 - 1.2 mg/dL   GFR calc non Af Amer >90  >90 mL/min   GFR calc Af Amer >90  >90 mL/min   Comment: (NOTE)     The eGFR has been calculated using the CKD EPI equation.     This calculation has not been validated in all clinical situations.     eGFR's persistently <90 mL/min signify possible Chronic Kidney     Disease.   Anion gap 12  5 - 15  ETHANOL     Status: None   Collection Time    04/11/14  6:35 AM      Result Value Ref Range   Alcohol, Ethyl (B) <11  0 - 11 mg/dL   Comment:            LOWEST DETECTABLE LIMIT FOR     SERUM ALCOHOL IS 11 mg/dL     FOR  MEDICAL PURPOSES ONLY  ACETAMINOPHEN LEVEL     Status: None   Collection Time    04/11/14  6:35 AM      Result Value Ref Range   Acetaminophen (Tylenol), Serum <15.0  10 - 30 ug/mL   Comment:            THERAPEUTIC CONCENTRATIONS VARY     SIGNIFICANTLY. A RANGE OF 10-30     ug/mL MAY BE AN EFFECTIVE     CONCENTRATION FOR MANY PATIENTS.     HOWEVER, SOME ARE BEST TREATED     AT CONCENTRATIONS OUTSIDE THIS     RANGE.     ACETAMINOPHEN CONCENTRATIONS     >150 ug/mL AT 4 HOURS AFTER     INGESTION AND >50 ug/mL AT 12     HOURS AFTER INGESTION ARE     OFTEN ASSOCIATED WITH TOXIC     REACTIONS.  SALICYLATE LEVEL     Status: Abnormal   Collection Time    04/11/14  6:35 AM      Result Value Ref Range    Salicylate Lvl <9.0 (*) 2.8 - 20.0 mg/dL   Labs are reviewed.  Current Facility-Administered Medications  Medication Dose Route Frequency Provider Last Rate Last Dose  . acetaminophen (TYLENOL) tablet 1,000 mg  1,000 mg Oral Q4H PRN Orpah Greek, MD      . diphenhydrAMINE (BENADRYL) capsule 25 mg  25 mg Oral Q4H PRN Orpah Greek, MD      . LORazepam (ATIVAN) tablet 1 mg  1 mg Oral Q6H PRN Orpah Greek, MD       No current outpatient prescriptions on file.    Psychiatric Specialty Exam:     Blood pressure 103/67, pulse 69, temperature 98.2 F (36.8 C), temperature source Oral, resp. rate 18, last menstrual period 03/10/2014, SpO2 99.00%.There is no weight on file to calculate BMI.  General Appearance: Casual  Eye Contact::  Good  Speech:  Normal Rate  Volume:  Normal  Mood:  Depressed  Affect:  Inappropriate, Labile and Tearful  Thought Process:  Loose and Flight of ideas  Orientation:  Full (Time, Place, and Person)  Thought Content:  "I am trying to fill this empty void"  Suicidal Thoughts:  No  Homicidal Thoughts:  No  Memory:  Immediate;   Good Recent;   Good Remote;   Good  Judgement:  Fair  Insight:  Present  Psychomotor Activity:  Normal  Concentration:  Poor  Recall:  [place] of Knowledge:Fair  Language: Fair  Akathisia:  No  Handed:  Right  AIMS (if indicated):     Assets:  Communication Skills Desire for Improvement Social Support  Sleep:      Musculoskeletal: Strength & Muscle Tone: within normal limits Gait & Station: Patient is lying on the bed Patient leans: N/A  Treatment Plan Summary: Discharge home with appointment for outpatient services.    Patient has an appointment [facility] Tomorrow at 5:00 PM and Therapy Thomasene Mohair will call patient with appointment date and time.   Rankin, Shuvon, FNP-BC 04/12/2014 2:10 PM  Patient is seen face to face for psychiatric evaluation along with physician  extender, case discussed after clinical rounds and formulated appropriate treatment plan. Reviewed the information documented and agree with the treatment plan.  Trystian Crisanto,JANARDHAHA R. 04/14/2014 9:53 AM

## 2014-04-12 NOTE — ED Notes (Addendum)
Pt. Admitted from ED after OD on 1-5 Trazadone and 10+ Hydroxazine tablets. Pt. States that she has been depressed about anniversary of her fathers death last year and relational problems with "controlling guys". Pt. Denies SI, HI, AVH at this time.

## 2014-04-12 NOTE — BH Assessment (Addendum)
[place] Assessment Progress Note      Pt has been psychiatrically cleared for discharge by Dr Louretta Shorten.  She is to follow up with Dr Louretta Shorten for outpatient psychiatry on Wednesday, July 14 at 5:00.  Also left message with Thomasene Mohair for patient to begin outpatient therapy ASAP.  Pt signed consent to release information.

## 2014-04-12 NOTE — Telephone Encounter (Signed)
Patient returning call from [place] to discuss insurance benefits and is very anxious about receiving lab results.  Spoke with patient at time of incoming call.  She is requesting lab results, specifically HIV testing.    Gynecology Kelly Bilis, MD) Dx: Screen for STD (sexually transmitted ...         Notes Recorded by Kelly [name], CMA on 04/08/2014 at 10:11 AM Patient notified. Notes Recorded by Kelly Bilis, MD on 04/07/2014 at 11:45 AM Inform HIV and RPR neg      Advised of message from Kelly [name] that HIV and RPR testing were negative. Pap results still pending.  Patient concerned about HIV results. They are negative but patient wants to retest according to protocol. Advised can have office visit to discuss another test with Kelly [name], usually testing is done again after 6 months. She is agreeable to this. Office visit scheduled for 11/18/14.   Patient also states that her cycle is late and wondering if we tested her for pregnancy. Patient states that she is not currently using birth control and that her last cycle was 03/10/14. Negative home pregnancy testing recently. I advised according to EPIC patient had negative urine pregnancy in ER on 04/11/14. Advised to call our office back if no cycle in 1 week or develops pain or any other concerns.    Advised patient would receive call back re: Pap results.   Routing to provider for final review. Patient agreeable to disposition. Will close encounter

## 2014-04-12 NOTE — B[facility] Suicide Risk Assessment (Cosign Needed)
Suicide Risk Assessment   Discharge Assessment     Demographic Factors:  Black, female  Total Time spent with patient: 30 minutes Psychiatric Specialty Exam:      Blood pressure 103/67, pulse 69, temperature 98.2 F (36.8 C), temperature source Oral, resp. rate 18, last menstrual period 03/10/2014, SpO2 99.00%.There is no weight on file to calculate BMI.   General Appearance: Casual   Eye Contact:: Good   Speech: Normal Rate   Volume: Normal   Mood: Depressed   Affect: Inappropriate, Labile and Tearful   Thought Process: Loose and Flight of ideas   Orientation: Full (Time, Place, and Person)   Thought Content: "I am trying to fill this empty void"   Suicidal Thoughts: No   Homicidal Thoughts: No   Memory: Immediate; Good  Recent; Good  Remote; Good   Judgement: Fair   Insight: Present   Psychomotor Activity: Normal   Concentration: Poor   Recall: [place] of Knowledge:Fair   Language: Fair   Akathisia: No   Handed: Right   AIMS (if indicated):   Assets: Communication Skills  Desire for Improvement  Social Support   Sleep:   Musculoskeletal:  Strength & Muscle Tone: within normal limits  Gait & Station: Patient is lying on the bed  Patient leans: N/A    Mental Status Per Nursing Assessment::   On Admission:     Current Mental Status by Physician: Patient denies suicidal/homicidal ideation, psychosis, and paranoia  Loss Factors: NA  Historical Factors: NA  Risk Reduction Factors:   Sense of responsibility to family and Religious beliefs about death  Continued Clinical Symptoms:  Depression  Cognitive Features That Contribute To Risk:  Thought constriction (tunnel vision)    Suicide Risk:  Minimal: No identifiable suicidal ideation.  Patients presenting with no risk factors but with morbid ruminations; may be classified as minimal risk based on the severity of the depressive symptoms  Discharge Diagnoses:  AXIS I: Bipolar, Manic  AXIS II:  Deferred  AXIS III: History reviewed. No pertinent past medical history.  AXIS IV: other psychosocial or environmental problems, problems related to social environment and problems with primary support group  AXIS V: 31-40 impairment in reality testing   Plan Of Care/Follow-up recommendations:  Activity:  Resume usual acivity Diet:  Resume usual diet  Is patient on multiple antipsychotic therapies at discharge:  No   Has Patient had three or more failed trials of antipsychotic monotherapy by history:  No  Recommended Plan for Multiple Antipsychotic Therapies: NA    Manjit Bufano, FNP-BC 04/12/2014, 2:31 PM

## 2014-04-12 NOTE — Discharge Instructions (Signed)
Major Depressive Disorder Major depressive disorder (MDD) is a mental illness. It also may be called clinical depression or unipolar depression. MDD usually causes feelings of sadness, hopelessness, or helplessness. Some people with MDD do not feel particularly sad but lose interest in doing things they used to enjoy (anhedonia). MDD also can cause physical symptoms. It can interfere with work, school, relationships, and other normal everyday activities. MDD varies in severity but is longer lasting and more serious than the sadness we all feel from time to time in our lives. MDD often is triggered by stressful life events or major life changes. Examples of these triggers include divorce, loss of your job or home, a move, and the death of a family member or close friend. Sometimes MDD occurs for no obvious reason at all. People who have family members with MDD or bipolar disorder are at higher risk for developing MDD, with or without life stressors. MDD can occur at any age. It may occur just once in your life (single episode MDD). It may occur multiple times (recurrent MDD). SYMPTOMS People with MDD have either anhedonia or depressed mood on nearly a daily basis for at least 2 weeks or longer. Symptoms of depressed mood include:  Feelings of sadness (blue or down in the dumps) or emptiness.  Feelings of hopelessness or helplessness.  Tearfulness or episodes of crying (may be observed by others).  Irritability (children and adolescents). In addition to depressed mood or anhedonia or both, people with MDD have at least four of the following symptoms:  Difficulty sleeping or sleeping too much.   Significant change (increase or decrease) in appetite or weight.   Lack of energy or motivation.  Feelings of guilt and worthlessness.   Difficulty concentrating, remembering, or making decisions.  Unusually slow movement (psychomotor retardation) or restlessness (as observed by others).    Recurrent wishes for death, recurrent thoughts of self-harm (suicide), or a suicide attempt. People with MDD commonly have persistent negative thoughts about themselves, other people, and the world. People with severe MDD may experiencedistorted beliefs or perceptions about the world (psychotic delusions). They also may see or hear things that are not real (psychotic hallucinations). DIAGNOSIS MDD is diagnosed through an assessment by your caregiver. Your caregiver will ask aboutaspects of your daily life, such as mood,sleep, and appetite, to see if you have the diagnostic symptoms of MDD. Your caregiver may ask about your medical history and use of alcohol or drugs, including prescription medications. Your caregiver also may do a physical exam and blood work. This is because certain medical conditions and the use of certain substances can cause MDD-like symptoms (secondary depression). Your caregiver also may refer you to a mental health specialist for further evaluation and treatment. TREATMENT It is important to recognize the symptoms of MDD and seek treatment. The following treatments can be prescribed for MDD:   Medication--Antidepressant medications usually are prescribed. Antidepressant medications are thought to correct chemical imbalances in the brain that are commonly associated with MDD. Other types of medication may be added if MDD symptoms do not respond to antidepressant medications alone or if psychotic delusions or hallucinations occur.  Talk therapy--Talk therapy can be helpful in treating MDD by providing support, education, and guidance. Certain types of talk therapy also can help with negative thinking (cognitive behavioral therapy) and with relationship issues that trigger MDD (interpersonal therapy). A mental health specialist can help determine which treatment is best for you. Most people with MDD do well with a combination of  medication and talk therapy. Treatments  involving electrical stimulation of the brain can be used in situations with extremely severe symptoms or when medication and talk therapy do not work over time. These treatments include electroconvulsive therapy, transcranial magnetic stimulation, and vagal nerve stimulation. Document Released: 01/11/2013 Document Reviewed: 01/11/2013 [facility] Patient Information 2015 [place]. This information is not intended to replace advice given to you by your health care provider. Make sure you discuss any questions you have with your health care provider.  Adjustment Disorder Most changes in life can cause stress. Getting used to changes may take a few months or longer. If feelings of stress, hopelessness, or worry continue, you may have an adjustment disorder. This stress-related mental health problem may affect your feelings, thinking and how you act. It occurs in both sexes and happens at any age. SYMPTOMS  Some of the following problems may be seen and vary from person to person:  Sadness or depression.  Loss of enjoyment.  Thoughts of suicide.  Fighting.  Avoiding family and friends.  Poor school performance.  Hopelessness, sense of loss.  Trouble sleeping.  Vandalism.  Worry, weight loss or gain.  Crying spells.  Anxiety  Reckless driving.  Skipping school.  Poor work Systems analyst.  Nervousness.  Ignoring bills.  Poor attitude. DIAGNOSIS  Your caregiver will ask what has happened in your life and do a physical exam. They will make a diagnosis of an adjustment disorder when they are sure another problem or medical illness causing your feelings does not exist. TREATMENT  When problems caused by stress interfere with you daily life or last longer than a few months, you may need counseling for an adjustment disorder. Early treatment may diminish problems and help you to better cope with the stressful events in your life. Sometimes medication is necessary. Individual  counseling and or support groups can be very helpful. PROGNOSIS  Adjustment disorders usually last less than 3 to 6 months. The condition may persist if there is long lasting stress. This could include health problems, relationship problems, or job difficulties where you can not easily escape from what is causing the problem. PREVENTION  Even the most mentally healthy, highly functioning people can suffer from an adjustment disorder given a significant blow from a life-changing event. There is no way to prevent pain and loss. Most people need help from time to time. You are not alone. SEEK MEDICAL CARE IF:  Your feelings or symptoms listed above do not improve or worsen. Document Released: 05/21/2006 Document Revised: 12/09/2011 Document Reviewed: 08/12/2007 [facility] Patient Information 2015 [place], [place]. This information is not intended to replace advice given to you by your health care provider. Make sure you discuss any questions you have with your health care provider.

## 2014-04-19 ENCOUNTER — Encounter: Payer: Self-pay | Admitting: Emergency Medicine

## 2014-04-19 ENCOUNTER — Other Ambulatory Visit: Payer: Self-pay | Admitting: Gynecology

## 2014-04-19 DIAGNOSIS — B9689 Other specified bacterial agents as the cause of diseases classified elsewhere: Secondary | ICD-10-CM

## 2014-04-19 DIAGNOSIS — N760 Acute vaginitis: Secondary | ICD-10-CM

## 2014-04-19 DIAGNOSIS — IMO0002 Reserved for concepts with insufficient information to code with codable children: Secondary | ICD-10-CM

## 2014-04-19 NOTE — Telephone Encounter (Signed)
Phone number as listed in designated party release form is disconnected.  Will try again.  Will send letter to request a call back.

## 2014-04-19 NOTE — Telephone Encounter (Signed)
Message copied by Michele Mcalpine on Tue Apr 19, 2014 12:10 PM ------      Message from: Elveria Rising      Created: Tue Apr 12, 2014  4:59 PM       ASCUS +HPV, will need colpo, order dropped      Gc/ctm negative ------

## 2014-04-25 ENCOUNTER — Other Ambulatory Visit: Payer: Self-pay | Admitting: Obstetrics and Gynecology

## 2014-04-25 MED ORDER — TINIDAZOLE 500 MG PO TABS: 2.0000 g | ORAL_TABLET | Freq: Every day | ORAL | Status: DC

## 2014-04-25 NOTE — Telephone Encounter (Signed)
Spoke with patient. Results given as seen below. Patient agreeable and verbalizes understanding. Patient would like to know if this is cancerous or concerning. Advised patient that this is not cancer and that having the colposcopy will better allow the doctor to take a look at the cell changes within the cervix and then make further recommendations. Patient would like to know where the cell changes come from "Do they come from having too many partners?" Advised that I can not pinpoint where the cell changes have come from. Advised patient can come in to speak with Dr.Lathrop as she seems to have a lot of questions and that way Dr.Lathop can answer them all before the colpo. Offered patient 4pm appointment with Dr.Lathrop on Wednesday but patient declines. Patient is not currently on birth control. Advised to call back with first day of cycle to schedule colposcopy. Patient agreeable. Advised if she would like to come in to see Dr.Lathrop before hand to call and we can get her scheduled to come in. Patient agreeable.  Notes Recorded by Azalia Bilis, MD on 04/19/2014 at 12:40 PM GC/CTM negative, PAP ASCUS +HPV will need colpo, also BV on PAP recommend treating before colpo- tindamax called in, pls give alcohol precautions   Routing to Dr.Silva as Dr.Lathrop is out of the office. Patient needs order for Tindamax. I do not see what dosage or how much. Please advised. Cc: Dr.Lathrop

## 2014-04-25 NOTE — Telephone Encounter (Signed)
Spoke with patient advised of medication dosage as seen below from [place]. Patient is agreeable. Patient would like to know with call changes on pap smear what the "treatment" is. Advised patient that the next step for follow up and care is the colposcopy. Advised this is very important for evaluation of cells. Advised I could not say what would be needed next if anything until the colposcopy is done and the doctor gets the biopsy results back. Patient is agreeable and verbalizes understanding. Will call back with first day of cycle to schedule colpo.  Routing to Dr.Silva as covering Cc: Dr.Lathrop  Routing to provider for final review. Patient agreeable to disposition. Will close encounter

## 2014-04-25 NOTE — Telephone Encounter (Signed)
I will prescribe Tindamax 2000 mg po q day for 2 days.

## 2014-04-25 NOTE — Telephone Encounter (Signed)
Phone number updated in EPIC. Patient advised to complete a new DPR at next visit.

## 2014-04-26 ENCOUNTER — Telehealth: Payer: Self-pay | Admitting: Gynecology

## 2014-04-26 NOTE — Telephone Encounter (Signed)
Left message to call [place] at [phone].  Advise patient that HIV, RPR, GC and CTM were done.

## 2014-04-26 NOTE — Telephone Encounter (Signed)
Pt wanting to know what stds she was tested for.

## 2014-04-28 NOTE — Telephone Encounter (Signed)
Left message to call Kaitlyn at [phone]. 

## 2014-04-28 NOTE — Telephone Encounter (Signed)
Spoke with patient. Advised of the STD's that we tested for when she was in the office. Patient would like to make sure they were all negative. Advised all STD testing came back negative. Patient states she has completed her tindamax and is wondering if there is anything she needs to do from here. Advised patient that if any further symptoms occur to give [place] a call back. If not will need to call with first day of menses to scheduled colpo as she is not on birth control. Patient is agreeable and verbalizes understanding.  Routing to provider for final review. Patient agreeable to disposition. Will close encounter

## 2014-05-09 ENCOUNTER — Telehealth: Payer: Self-pay | Admitting: Gynecology

## 2014-05-09 NOTE — Telephone Encounter (Signed)
tinidazole (TINDAMAX) 500 MG tablet  Take 4 tablets (2,000 mg total) by mouth daily with breakfast. Take for 2 days in a row., Starting 04/25/2014, Until Discontinued, Normal, Last Dose: Not Recorded  Refills: 0 ordered Pharmacy: [id number] - [place], [place]  Patient calling to request a refill on the above medication. She says she symptoms have "not completely gone away." Patient declined an appointment.

## 2014-05-09 NOTE — Telephone Encounter (Signed)
Message left to return call to Evone Arseneau at [phone].    

## 2014-05-09 NOTE — Telephone Encounter (Signed)
Spoke with patient. She states she is at work. I asked patient to call back or if she can go to a place where she could talk and she said that she was. Advised patient that I need to find out more about her symptoms and then could suggest advise for her.  Patient feels she may have yeast infection.  Patient requests office visit and this was made for 05/10/14. She is agreeable.

## 2014-05-10 ENCOUNTER — Telehealth: Payer: Self-pay | Admitting: Gynecology

## 2014-05-10 ENCOUNTER — Ambulatory Visit: Payer: BC Managed Care – PPO | Admitting: Gynecology

## 2014-05-10 NOTE — Telephone Encounter (Signed)
Patient dnka her appointment today for "vaginal irritation". Patient scheduled this appointment yesterday at 4:40pm.  I left a message for patient to call if she would like to reschedule.

## 2014-05-10 NOTE — Telephone Encounter (Signed)
Dr. Charlies Constable,  Patient is in hold to schedule colposcopy with period.  She had BV on her pap and completed tindamax.  When I spoke with her yesterday, she was vague about symptoms and did not provide me with further information. Patient feels she has yeast infection and requested appointment with you, which was scheduled.  Will continue to hold patient for colpo.

## 2014-05-10 NOTE — Telephone Encounter (Signed)
Pt says she would like to wait until her period come on so she can schedule procedure.

## 2014-05-11 NOTE — Telephone Encounter (Signed)
Pt dnka'd appt 8/11, I am happy to see her before the colpo.

## 2014-05-11 NOTE — Telephone Encounter (Signed)
Pt has questions regarding her abnormal pap and what the medical term of it is.

## 2014-05-11 NOTE — Telephone Encounter (Signed)
Left message to call [place] at [phone].  Offer pt Friday 8/14 at 2:30pm per Dr.Lathrop.

## 2014-05-11 NOTE — Telephone Encounter (Signed)
Spoke with patient. Patient would like to know what her pap results showed. 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 Patient states that she was not told that she had positive HPV before and that she was told she did not have anything cancerous. Advised patient that this is not cancer. Advised patient that sometimes cell changes can be related to the HPV. Advised that this is something that she should come in to speak with Dr.Lathrop about as she has a lot of questions. Patient states that she was vaccinated for HPV when she was younger and would like to know why she has HPV now. Advised again this is something to discuss with Dr.Lathrop. Patient states she is confused and would like to come in to speak with Dr.Lathrop. Requesting Friday appointment. Advised would speak with Dr.Lathrop regarding time and give patient a call back. Patient agreeable.

## 2014-05-11 NOTE — Telephone Encounter (Signed)
Spoke with patient. Appointment scheduled for Friday at 2:30pm time per Dr.Lathrop. Patient agreeable to date and time.  Routing to provider for final review. Patient agreeable to disposition. Will close encounter

## 2014-05-13 ENCOUNTER — Telehealth: Payer: Self-pay | Admitting: Gynecology

## 2014-05-13 ENCOUNTER — Ambulatory Visit (INDEPENDENT_AMBULATORY_CARE_PROVIDER_SITE_OTHER): Payer: BC Managed Care – PPO | Admitting: Gynecology

## 2014-05-13 VITALS — BP 102/68 | Resp 12 | Wt 154.0 lb

## 2014-05-13 DIAGNOSIS — Z708 Other sex counseling: Secondary | ICD-10-CM

## 2014-05-13 DIAGNOSIS — R6889 Other general symptoms and signs: Secondary | ICD-10-CM

## 2014-05-13 DIAGNOSIS — R87610 Atypical squamous cells of undetermined significance on cytologic smear of cervix (ASC-US): Secondary | ICD-10-CM | POA: Insufficient documentation

## 2014-05-13 DIAGNOSIS — IMO0002 Reserved for concepts with insufficient information to code with codable children: Secondary | ICD-10-CM | POA: Insufficient documentation

## 2014-05-13 DIAGNOSIS — F4323 Adjustment disorder with mixed anxiety and depressed mood: Secondary | ICD-10-CM

## 2014-05-13 HISTORY — DX: Atypical squamous cells of undetermined significance on cytologic smear of cervix (ASC-US): R87.610

## 2014-05-13 NOTE — Telephone Encounter (Signed)
Patient started her menstrual cycle today and needs to schedule her colposcopy.

## 2014-05-13 NOTE — Patient Instructions (Signed)
Colposcopy Colposcopy is a procedure to examine your cervix and vagina, or the area around the outside of your vagina, for abnormalities or signs of disease. The procedure is done using a lighted microscope called a colposcope. Tissue samples may be collected during the colposcopy if your health care provider finds any unusual cells. A colposcopy may be done if a woman has:  An abnormal Pap test. A Pap test is a medical test done to evaluate cells that are on the surface of the cervix.  A Pap test result that is suggestive of human papillomavirus (HPV). This virus can cause genital warts and is linked to the development of cervical cancer.  A sore on her cervix and the results of a Pap test were normal.  Genital warts on the cervix or in or around the outside of the vagina.  A mother who took the drug diethylstilbestrol (DES) while pregnant.  Painful intercourse.  Vaginal bleeding, especially after sexual intercourse. LET [facility] CARE PROVIDER KNOW ABOUT:  Any allergies you have.  All medicines you are taking, including vitamins, herbs, eye drops, creams, and over-the-counter medicines.  Previous problems you or members of your family have had with the use of anesthetics.  Any blood disorders you have.  Previous surgeries you have had.  Medical conditions you have. RISKS AND COMPLICATIONS Generally, a colposcopy is a safe procedure. However, as with any procedure, complications can occur. Possible complications include:  Bleeding.  Infection.  Missed lesions. BEFORE THE PROCEDURE   Tell your health care provider if you have your menstrual period. A colposcopy typically is not done during menstruation.  For 24 hours before the colposcopy, do not:  Douche.  Use tampons.  Use medicines, creams, or suppositories in the vagina.  Have sexual intercourse. PROCEDURE  During the procedure, you will be lying on your back with your feet in foot rests (stirrups). A warm  metal or plastic instrument (speculum) will be placed in your vagina to keep it open and to allow the health care provider to see the cervix. The colposcope will be placed outside the vagina. It will be used to magnify and examine the cervix, vagina, and the area around the outside of the vagina. A small amount of liquid solution will be placed on the area that is to be viewed. This solution will make it easier to see the abnormal cells. Your health care provider will use tools to suck out mucus and cells from the canal of the cervix. Then he or she will record the location of the abnormal areas. If a biopsy is done during the procedure, a medicine will usually be given to numb the area (local anesthetic). You may feel mild pain or cramping while the biopsy is done. After the procedure, tissue samples collected during the biopsy will be sent to a lab for analysis. AFTER THE PROCEDURE  You will be given instructions on when to follow up with your health care provider for your test results. It is important to keep your appointment. Document Released: 12/07/2002 Document Revised: 05/19/2013 Document Reviewed: 04/15/2013 [facility] Patient Information 2015 [place], [place]. This information is not intended to replace advice given to you by your health care provider. Make sure you discuss any questions you have with your health care provider.   Human Papillomavirus Human papillomavirus (HPV) is the most common sexually transmitted infection (STI) and is highly contagious. HPV infections cause genital warts and cancers to the outlet of the womb (cervix), birth canal (vagina), opening of  the birth canal (vulva), and anus. There are over 100 types of HPV. Four types of HPV are responsible for causing 70% of all cervical cancers. Ninety percent of anal cancers and genital warts are caused by HPV. Unless you have wart-like lesions in the throat or genital warts that you can see or feel, HPV usually does not cause  symptoms. Therefore, people can be infected for long periods and pass it on to others without knowing it. HPV in pregnancy usually does not cause a problem for the mother or baby. If the mother has genital warts, the baby rarely gets infected. When the HPV infection is found to be pre-cancerous on the cervix, vagina, or vulva, the mother will be followed closely during the pregnancy. Any needed treatment will be done after the baby is born. CAUSES   Having unprotected sex. HPV can be spread by oral, vaginal, or anal sexual activity.  Having several sex partners.  Having a sex partner who has other sex partners.  Having or having had another sexually transmitted infection. SYMPTOMS   More than 90% of people carrying HPV cannot tell anything is wrong.  Wart-like lesions in the throat (from having oral sex).  Warts in the infected skin or mucous membranes.  Genital warts may itch, burn, or bleed.  Genital warts may be painful or bleed during sexual intercourse. DIAGNOSIS   Genital warts are easily seen with the naked eye.  Currently, there is no FDA-approved test to detect HPV in males.  In females, a Pap test can show cells which are infected with HPV.  In females, a scope can be used to view the cervix (colposcopy). A colposcopy can be performed if the pelvic exam or Pap test is abnormal.  In females, a sample of tissue may be removed (biopsy) during the colposcopy. TREATMENT   Treatment of genital warts can include:  Podophyllin lotion or gel.  Bichloroacetic acid (BCA) or trichloroacetic acid (TCA).  Podofilox solution or gel.  Imiquimod cream.  Interferon injections.  Use of a probe to apply extreme cold (cryotherapy).  Application of an intense beam of light (laser treatment).  Use of a probe to apply extreme heat (electrocautery).  Surgery.  HPV of the cervix, vagina, or vulva can be treated with:  Cryotherapy.  Laser  treatment.  Electrocautery.  Surgery. Your caregiver will follow you closely after you are treated. This is because the HPV can come back and may need treatment again. HOME CARE INSTRUCTIONS   Follow your caregiver's instructions regarding medications, Pap tests, and follow-up exams.  Do not touch or scratch the warts.  Do not treat genital warts with medication used for treating hand warts.  Tell your sex partner about your infection because he or she may also need treatment.  Do not have sex while you are being treated.  After treatment, use condoms during sex to prevent future infections.  Have only 1 sex partner.  Have a sex partner who does not have other sex partners.  Use over-the-counter creams for itching or irritation as directed by your caregiver.  Use over-the-counter or prescription medicines for pain, discomfort, or fever as directed by your caregiver.  Do not douche or use tampons during treatment of HPV. PREVENTION   Talk to your caregiver about getting the HPV vaccines. These vaccines prevent some HPV infections and cancers. It is recommended that the vaccine be given to males and females between the age of 16 and 11 years old. It will not work if  you already have HPV and it is not recommended for pregnant women. The vaccines are not recommended for pregnant women.  Call your caregiver if you think you are pregnant and have the HPV.  A PAP test is done to screen for cervical cancer.  The first PAP test should be done at age 13.  Between ages 33 and 28, PAP tests are repeated every 2 years.  Beginning at age 22, you are advised to have a PAP test every 3 years as long as your past 3 PAP tests have been normal.  Some women have medical problems that increase the chance of getting cervical cancer. Talk to your caregiver about these problems. It is especially important to talk to your caregiver if a new problem develops soon after your last PAP test. In these  cases, your caregiver may recommend more frequent screening and Pap tests.  The above recommendations are the same for women who have or have not gotten the vaccine for HPV (Human Papillomavirus).  If you had a hysterectomy for a problem that was not a cancer or a condition that could lead to cancer, then you no longer need Pap tests. However, even if you no longer need a PAP test, a regular exam is a good idea to make sure no other problems are starting.   If you are between ages 72 and 14, and you have had normal Pap tests going back 10 years, you no longer need Pap tests. However, even if you no longer need a PAP test, a regular exam is a good idea to make sure no other problems are starting.  If you have had past treatment for cervical cancer or a condition that could lead to cancer, you need Pap tests and screening for cancer for at least 20 years after your treatment.  If Pap tests have been discontinued, risk factors (such as a new sexual partner)need to be re-assessed to determine if screening should be resumed.  Some women may need screenings more often if they are at high risk for cervical cancer. SEEK MEDICAL CARE IF:   The treated skin becomes red, swollen or painful.  You have an oral temperature above 102 F (38.9 C).  You feel generally ill.  You feel lumps or pimple-like projections in and around your genital area.  You develop bleeding of the vagina or the treatment area.  You develop painful sexual intercourse. Document Released: 12/07/2003 Document Revised: 12/09/2011 Document Reviewed: 12/22/2013 [facility] Patient Information 2015 [place], [place]. This information is not intended to replace advice given to you by your health care provider. Make sure you discuss any questions you have with your health care provider.

## 2014-05-13 NOTE — Telephone Encounter (Signed)
Patient forgot to ask for a prescription at her appointment today. Patient is asking for something for depression. Patient says sometimes its hard for her to get out of bed. Patient thinks she is going to get fired because of her depression.

## 2014-05-13 NOTE — Telephone Encounter (Signed)
Patient started cycle today.  Scheduled for Colposcopy for 05/23/14 at 1030 with Dr. Charlies Constable. Patient states she started her cycle today.  Patient states she is not sure how she got Lexapro in her chart. That she does not have a MD and does not know who prescribed that medication for her. Medication was placed by Earleen Newport, NP who is a provider at [facility] but psychiatric records are not accessible. Patient states "I have never seen anyone for that, but I am working to get a provider in network."  Patient denies ideas of self harm or harm to anyone else. She states that she just does not feel joy in activities like going to the movies with her family. Advised patient that Dr. Charlies Constable authorized Wellbutrin 150 mg SR QD for 7 days then increase to bid.  Patient requests a rx for Celexa. Patient states her brother uses celexa and she would like to try this as well.  Advised patient that I would discuss her request with Dr. Charlies Constable and return her call. She is agreeable.

## 2014-05-13 NOTE — Telephone Encounter (Signed)
1700: Dr. Charlies Constable out of office for the day.  Discussed with Dr. Quincy Simmonds, covering provider. Advised that we should not give rx for psychiatric treatment as patient has recent (04/11/14 attempt at overdose) with ER visit.  We can facilitate referral to psychiatry.  To advise patient if any thoughts of self harm or harm to anyone else to please call 911 or go to nearest ER.   1711: Spoke with patient. Advised that I spoke with covering provider and cannot place rx for Celexa at this time. Patient states that she understands the reasons for this. Advised that I would send her message to Dr. Charlies Constable as well and let her know her concerns. Patient concerned about finding in net work provider. Advised that we can work with her on this and will assist.  Patient again denies any thoughts of self harm or harm to anyone else. Advised if any concerns, to go to nearest ER or call 911, patient states that she feels safe and does not think this will be necessary.   Routing to Dr. Charlies Constable     Cc Dr. Quincy Simmonds.

## 2014-05-13 NOTE — Progress Notes (Signed)
Pt here for consultation for abnormal PAP, first PAP done.  Pt questions her +HPV as she had gardisil before first sexual activity. We had a long discussion regarding PAP smear guidelines and role of HPV in dysplasia and the possible progression to cancer.  We discussed the many serotypes of HPV and their affect on cervix and that the vaccine only covered 16/18.  We reviewed the importance of using condoms to protect against STD's including HPV. We dicussed the impact of HPV on fertility and childbirth as well as mode of transmission. She is not currently sexually active but encourage her to use condoms with any activity. Pt agreeable, questions addressed. We also discussed her upcoming colposcopy.  procedure outlined, role of biopsies and follow up.  She started her menses today, so she was scheduled for 05/23/14. 8m spent in counseling , >50% face to face

## 2014-05-16 NOTE — Telephone Encounter (Signed)
Left message for [facility] [phone] to return my call to place referral.

## 2014-05-16 NOTE — Telephone Encounter (Signed)
Referral faxed to [facility] at this time.  Faxed confirmation received.  They are unable to schedule patient prior to September and will contact her to schedule.  Patient requests Celexa.   Dr. Charlies Constable, okay to wait until behavioral health can see patient?

## 2014-05-17 ENCOUNTER — Ambulatory Visit: Payer: BC Managed Care – PPO | Admitting: Gynecology

## 2014-05-17 ENCOUNTER — Telehealth: Payer: Self-pay | Admitting: Gynecology

## 2014-05-17 NOTE — Telephone Encounter (Signed)
Called patient. Offered office visit with Dr. Charlies Constable to discuss medications. Patient agreeable.  Advised referral was placed for behavioral health and they will contact her. She is agreeable.  Routing to provider for final review. Patient agreeable to disposition. Will close encounter

## 2014-05-17 NOTE — Telephone Encounter (Signed)
Spoke with patient. Advised spoke with Dr.Lathrop who will try to speak with patient at [place] appointment about medications but recommends still scheduling an appointment just in case there is not enough time. Appointment scheduled for 9/9 at 10am. Patient agreeable to date and time. Aware this appointment will be based upon if there is enough time at [place] appointment.   Routing to provider for final review. Patient agreeable to disposition. Will close encounter

## 2014-05-17 NOTE — Telephone Encounter (Addendum)
Patient feel asleep and missed her appointment (request medication) today with Dr. Charlies Constable. Patient needs to reschedule, no appointments available. To triage to reschedule.

## 2014-05-23 ENCOUNTER — Ambulatory Visit (INDEPENDENT_AMBULATORY_CARE_PROVIDER_SITE_OTHER): Payer: BC Managed Care – PPO | Admitting: Gynecology

## 2014-05-23 ENCOUNTER — Telehealth: Payer: Self-pay | Admitting: Gynecology

## 2014-05-23 VITALS — BP 126/88 | Resp 18 | Ht 67.0 in | Wt 152.0 lb

## 2014-05-23 DIAGNOSIS — R6889 Other general symptoms and signs: Secondary | ICD-10-CM

## 2014-05-23 DIAGNOSIS — IMO0002 Reserved for concepts with insufficient information to code with codable children: Secondary | ICD-10-CM

## 2014-05-23 NOTE — Telephone Encounter (Signed)
Spoke with patient. Patient would like to know what the high risk HPV means on her AVS. Advised patient that is part of the results from her pap. So her pap was ASCUS with positive HRHPV. Advised this is the name stating it showed positive for HPV. Patient is agreeable. Patient would like to know what Dr.Lathrop meant when she said that the HPV was causing changes. Advised patient that Dr.Lathrop states that the HPV could be causing the cell changes that were seen with her pap. Patient would like to know if this is cancer. Advised patient that this does not mean that this is cancer. Patient is agreeable and verbalizes understanding. Will call back with any further questions.  Routing to provider for final review. Patient agreeable to disposition. Will close encounter

## 2014-05-23 NOTE — Telephone Encounter (Signed)
Pt was just in for colonoscopy with Dr Charlies Constable and is confused about what the diagnosis is on her visit summary.

## 2014-05-23 NOTE — Patient Instructions (Signed)

## 2014-05-23 NOTE — Progress Notes (Addendum)
Patient ID: Kelly [name], female   DOB: 06/28/1991, 23 y.o.   MRN: [id number]  Chief Complaint  Patient presents with  . Procedure    Patient is here for Colposcopy- Last Pap 04/06/14 ASCUS + HPV    HPI Kelly [name] is a 23 y.o. female.  Pt has been counseled regarding HPV and PAP smear guidelines.  She expressed a lot of angst regarding this infection.  She prefers to colpo for piece of mind, we had reviewed possible outcomes of C&B and follow up's.   Pt also requesting repeat STD screens, done in July with PAP HPI  Indications: Pap smear on July 2015 showed: ASCUS with POSITIVE high risk HPV. Previous colposcopy: none. Prior cervical treatment: no treatment.Procedure explained and patient's questions were invited and answered.  Consent form signed.    Role of HPV in genesis of SIL discussed with patient, and questions answered.     No past medical history on file.  No past surgical history on file.  Family History  Problem Relation Age of Onset  . Diabetes Father   . Breast cancer Maternal Grandmother   . Pancreatic cancer Maternal Grandmother     Social History History  Substance Use Topics  . Smoking status: Never Smoker   . Smokeless tobacco: Never Used  . Alcohol Use: Yes     Comment: Occasional    No Known Allergies  Current Outpatient Prescriptions  Medication Sig Dispense Refill  . escitalopram (LEXAPRO) 5 MG tablet Take 1 tablet (5 mg total) by mouth daily.  30 tablet  1   No current facility-administered medications for this visit.    Review of Systems Review of Systems  Height 5\' 7"  (1.702 m), last menstrual period 05/13/2014.  Physical Exam Physical Exam  Nursing note and vitals reviewed. Constitutional: She appears well-developed and well-nourished.  Genitourinary:      Data Reviewed STD and PAP results  Assessment    Procedure Details  The risks and benefits of the procedure and Written informed consent  obtained.  Marland KitchenSpeculum inserted atraumatically and cervix visualized.  3% acetic acid applied.  Cervix examined using 3.75 and 7.5 and 15   X magnification and green filter.    Gross appearance:normal  Squamocolumnar junction seen in entirety: yes   no visible lesions, no mosaicism, no abnormal vasculature, acetowhite lesion(s) noted at 3 o'clock and HPV changes noted at 12 o'clock  cervix swabbed with Lugol's solution, SCJ visualized - lesion at 3 o'clock, cervical biopsies taken at 3,12 o'clock, specimen labelled and sent to pathology and hemostasis achieved with Monsel's solution  Extent of lesion entirely seen: yes    Specimens: 2,77  Complications: none.     Plan     Specimens labelled and sent to Pathology. Triage based on results Pt assured too soon to repeat STD serologies, can repeat in 61m if desires-pt agreeable Pt relieved to have procedure done      Jenayah Antu H 05/23/2014, 10:52 AM    Patient tolerated procedure well.    Post biopsy instructions and AVS given to patient.    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

## 2014-05-25 LAB — IPS OTHER TISSUE BIOPSY

## 2014-06-01 ENCOUNTER — Telehealth: Payer: Self-pay | Admitting: Gynecology

## 2014-06-01 NOTE — Telephone Encounter (Signed)
Pt calling for biopsy results.

## 2014-06-01 NOTE — Telephone Encounter (Signed)
Message left to return call to Beva Remund at [phone].    

## 2014-06-01 NOTE — Telephone Encounter (Addendum)
Message left to return call to [place] at [phone].   Patient was also referred to [facility] for medication management and she has not returned their phone call from 05/24/14.

## 2014-06-01 NOTE — Telephone Encounter (Signed)
Pt would like to have the dnka fee waived because she had to go to work and when she called to cancel no one made it known to her that she would have a fee.

## 2014-06-01 NOTE — Telephone Encounter (Signed)
Message copied by Michele Mcalpine on Wed Jun 01, 2014  3:46 PM ------      Message from: [place], [place]: Wed May 25, 2014  2:11 PM       Inform bx low grade as suspected, will repeat pap next year, nothing else to do, recall 8 ------

## 2014-06-01 NOTE — Telephone Encounter (Signed)
Return call to [place]. °

## 2014-06-02 NOTE — Telephone Encounter (Signed)
Spoke with patient.  Message from Dr. Charlies Constable given. Stressed importance of pap in one year. Patient verbalized understanding. Patient has annual exam scheduled for 2016.    Patient reports that she has indeed reached [facility] and has appointment scheduled for her next off day 06/09/14. She will call us with update after appointment. Cancelled 9/9 office visit with Dr. Charlies Constable for medication request per patient request.  Routing to provider for final review. Patient agreeable to disposition. Will close encounter

## 2014-06-02 NOTE — Telephone Encounter (Signed)
Message left to return call to [place] at [phone].   Advised message is not urgent.  Can discuss with any nurse, myself, Alesia Richards or Gay Filler.

## 2014-06-02 NOTE — Telephone Encounter (Signed)
Return call to [place]. °

## 2014-06-02 NOTE — Telephone Encounter (Signed)
Pt returning call

## 2014-06-08 ENCOUNTER — Ambulatory Visit: Payer: BC Managed Care – PPO | Admitting: Gynecology

## 2014-06-09 NOTE — Telephone Encounter (Signed)
Please inform patient will waive as a one time courtesy. Please remind patient of office policy for missed appointments for the future.

## 2014-06-14 ENCOUNTER — Telehealth: Payer: Self-pay | Admitting: Gynecology

## 2014-06-14 NOTE — Telephone Encounter (Signed)
Patient scheduled an appointment with Johny Shock, CNM on 06/15/14 for "recurrent vaginitis." The patient is not available to come in for an appointment today but wants to know if can be prescribed "something to help with the discomfort" until the appointment.  [id number] - [place], [address]

## 2014-06-14 NOTE — Telephone Encounter (Signed)
Spoke with patient. Advised patient can use OTC hydrocortisone ointment and oatmeal sitz bath for relief until can be seen tomorrow. Advised not to start on OTC yeast medication as this will prevent [place] from getting a culture. Patient would like to know if she will know if she has a yeast infection tomorrow. Advised patient a vaginal culture will be taken and we can properly treat from there. Patient is agreeable and verbalizes understanding.  Cc: Dr.Lathrop  Routing to provider for final review. Patient agreeable to disposition. Will close encounter

## 2014-06-15 ENCOUNTER — Encounter: Payer: Self-pay | Admitting: Certified Nurse Midwife

## 2014-06-15 ENCOUNTER — Ambulatory Visit (INDEPENDENT_AMBULATORY_CARE_PROVIDER_SITE_OTHER): Payer: BC Managed Care – PPO | Admitting: Certified Nurse Midwife

## 2014-06-15 VITALS — BP 100/64 | HR 64 | Resp 16 | Ht 67.0 in | Wt 158.0 lb

## 2014-06-15 DIAGNOSIS — IMO0002 Reserved for concepts with insufficient information to code with codable children: Secondary | ICD-10-CM

## 2014-06-15 DIAGNOSIS — N949 Unspecified condition associated with female genital organs and menstrual cycle: Secondary | ICD-10-CM

## 2014-06-15 DIAGNOSIS — N9489 Other specified conditions associated with female genital organs and menstrual cycle: Secondary | ICD-10-CM

## 2014-06-15 DIAGNOSIS — B3731 Acute candidiasis of vulva and vagina: Secondary | ICD-10-CM

## 2014-06-15 DIAGNOSIS — B373 Candidiasis of vulva and vagina: Secondary | ICD-10-CM

## 2014-06-15 DIAGNOSIS — Z0000 Encounter for general adult medical examination without abnormal findings: Secondary | ICD-10-CM

## 2014-06-15 LAB — POCT URINALYSIS DIPSTICK
Bilirubin, UA: NEGATIVE
GLUCOSE UA: NEGATIVE
Ketones, UA: NEGATIVE
Leukocytes, UA: NEGATIVE
Nitrite, UA: NEGATIVE
Protein, UA: NEGATIVE
RBC UA: NEGATIVE
UROBILINOGEN UA: NEGATIVE
pH, UA: 5

## 2014-06-15 LAB — POCT URINE PREGNANCY: Preg Test, Ur: NEGATIVE

## 2014-06-15 MED ORDER — TERCONAZOLE 0.4 % VA CREA: 1.0000 | TOPICAL_CREAM | Freq: Every day | VAGINAL | Status: DC

## 2014-06-15 NOTE — Progress Notes (Signed)
23 y.o.single  g0p0 here with complaint of vaginal symptoms of itching, burning, and increase discharge. Describes discharge as white, thick, no odor. Onset of symptoms 4-5 days ago. Denies new personal products. No STD concerns. Urinary symptoms none Contraception condoms not consistent. Interested in [place] .   O:Healthy female WDWN Affect: normal, orientation x 3  Exam: Abdomen:soft, non tender Lymph node: no enlargement or tenderness Pelvic exam: External genital: normal BUS: negative Vagina: white thick non odorous  discharge noted. Ph: 4.0  ,Wet prep taken Cervix: normal, non tender Uterus: normal, non tender Adnexa:normal, non tender, no masses or fullness noted   Wet Prep results: positive for yeast Poct urine- neg UPT-neg  A:Yeast vaginitis Contraception condoms   P:Discussed findings of yeast vaginitis and etiology. Discussed Aveeno or baking soda sitz bath for comfort. Avoid moist clothes or pads for extended period of time. If working out in gym clothes or swim suits for long periods of time change underwear or bottoms of swimsuit if possible. Olive Oil use for skin protection prior to activity can be used to external skin. Rx: Terazol 7 see order with instructions Given information on Nuvaring will advise if desires change.  Rv prn

## 2014-06-15 NOTE — Patient Instructions (Signed)

## 2014-06-20 NOTE — Progress Notes (Signed)
Reviewed personally.  M. Suzanne Angy Swearengin, MD.  

## 2014-08-01 ENCOUNTER — Telehealth: Payer: Self-pay | Admitting: *Deleted

## 2014-08-01 ENCOUNTER — Encounter: Payer: Self-pay | Admitting: Nurse Practitioner

## 2014-08-01 ENCOUNTER — Ambulatory Visit (INDEPENDENT_AMBULATORY_CARE_PROVIDER_SITE_OTHER): Payer: BC Managed Care – PPO | Admitting: Nurse Practitioner

## 2014-08-01 VITALS — BP 122/68 | HR 84 | Ht 67.0 in | Wt 165.0 lb

## 2014-08-01 DIAGNOSIS — Z113 Encounter for screening for infections with a predominantly sexual mode of transmission: Secondary | ICD-10-CM

## 2014-08-01 DIAGNOSIS — B9689 Other specified bacterial agents as the cause of diseases classified elsewhere: Secondary | ICD-10-CM

## 2014-08-01 DIAGNOSIS — N760 Acute vaginitis: Secondary | ICD-10-CM

## 2014-08-01 DIAGNOSIS — A499 Bacterial infection, unspecified: Secondary | ICD-10-CM

## 2014-08-01 MED ORDER — METRONIDAZOLE 500 MG PO TABS: 500.0000 mg | ORAL_TABLET | Freq: Two times a day (BID) | ORAL | Status: DC

## 2014-08-01 NOTE — Progress Notes (Signed)
Subjective:     Patient ID: Kelly [name], female   DOB: [birth year], 23 y.o.   MRN: [id number]  HPI This 23 yo G0  SAA Fe started with vaginal symptoms of vulvar irritation and redness with itching on Saturday.  She has been treated recently for BV and feels it is back again.  She does not want to use a cream for treatment.   No urinary symptoms.  Same partner for 8 months. Condoms or withdrawal for birth control.  She would like to get STD's again. LMP 07/13/14.   Review of Systems  Constitutional: Negative for fever, chills and fatigue.  Gastrointestinal: Negative.  Negative for nausea, vomiting, abdominal pain, diarrhea and constipation.  Genitourinary: Positive for vaginal discharge. Negative for dysuria, urgency, frequency, flank pain, vaginal bleeding, vaginal pain, pelvic pain and dyspareunia.  Musculoskeletal: Negative.   Skin: Negative.   Neurological: Negative.   Psychiatric/Behavioral: Negative.        Objective:   Physical Exam  Constitutional: She is oriented to person, place, and time. She appears well-developed and well-nourished.  Abdominal: Soft. She exhibits no distension. There is no tenderness. There is no rebound and no guarding.  Genitourinary:  Thin clear to grey vaginal discharge.  No paiin on bimanual exam.    Wet Prep: PH: 5.0; KOH: negative; NSS: + clue cells.  Specimen collected for STD's  Neurological: She is alert and oriented to person, place, and time.  Psychiatric: She has a normal mood and affect. Her behavior is normal. Judgment and thought content normal.       Assessment:     BV R/O STD's     Plan:     As per request Flagyl 500 mg BID for a week Caution wit GI side effects and no ETOH Will call with STD's

## 2014-08-01 NOTE — Telephone Encounter (Signed)
Per Kelly [name] patient can come in today at 2:15, and for her to stop using the terazol cream. Notified patient of this she said the last time she used the terazol cream was yesterday. Patient aware that her appointment is at 2:15 and when she comes in patty will evaluate her; appointment scheduled.  Routed to provider for review, encounter closed.

## 2014-08-01 NOTE — Telephone Encounter (Signed)
Patient calling c/o of yeasty smell, white thick d/c, vaginal burning, and itching x Saturday. Has been using terazol cream with no resolve. Would like to know if she needs to come in or if she can be prescribed something. Advised her that she needs to come in and that I would look to see if Ms. Chong Sicilian has a appointment for today.

## 2014-08-01 NOTE — Patient Instructions (Signed)

## 2014-08-02 LAB — STD PANEL
HEP B S AG: NEGATIVE
HIV 1&2 Ab, 4th Generation: NONREACTIVE

## 2014-08-03 LAB — IPS N GONORRHOEA AND CHLAMYDIA BY PCR

## 2014-08-03 NOTE — Progress Notes (Signed)
Encounter reviewed by Dr. Brook Silva.  

## 2014-11-11 ENCOUNTER — Telehealth: Payer: Self-pay | Admitting: Nurse Practitioner

## 2014-11-11 NOTE — Telephone Encounter (Signed)
Spoke with patient. Advised patient per notes from 04/12/2014 phone call appointment was scheduled for HIV and RPR retesting per patient's request. Please see telephone note. Patient is agreeable and would like to have these retest done as well as screening for Gonorrhea and Chlamydia. Advised I will send a message over to [name], FNP to have these added to retest. Encouraged patient to speak with [name], FNP about retesting the day of appointment so that everything can be tested. Patient is agreeable. OV kept for 2/19 at 10:15am with [name], FNP.  Routing to provider for final review. Patient agreeable to disposition. Will close encounter

## 2014-11-11 NOTE — Telephone Encounter (Signed)
Patient wants to talk with the nurse. She is not sure why she is scheduled for an appointment.

## 2014-11-14 NOTE — Telephone Encounter (Signed)
OK will discuss at visit.

## 2014-11-18 ENCOUNTER — Telehealth: Payer: Self-pay | Admitting: Nurse Practitioner

## 2014-11-18 ENCOUNTER — Ambulatory Visit: Payer: BC Managed Care – PPO | Admitting: Gynecology

## 2014-11-18 ENCOUNTER — Ambulatory Visit: Payer: BC Managed Care – PPO | Admitting: Nurse Practitioner

## 2014-11-18 NOTE — Telephone Encounter (Signed)
Spoke with patient. She states she has been sick for over one week. Coughing and body aches, feeling weak today. She did not want to cancel because she did not want to have to pay late cancellation fee. Advised patient that since she is sick, okay to cancel appointment, no missed appointment fee would be placed since she is sick and our office understands this type of emergency.  Patient states she is going to go to an urgent care today to be seen. Does not have primary care.  Appointment rescheduled for Monday per patient request, advised patient to call back if not improved by then. She is agreeable.   Routing to provider for final review. Patient agreeable to disposition. Will close encounter   cc Starla Curl.

## 2014-11-18 NOTE — Telephone Encounter (Signed)
Patient is thinks she has a cold and has an appointment this morining with [facility]. Patient is asking if she could be treated for a cold also. I told patient we are not a primary care, but since she already has an appointment today I would have a nurse call her.

## 2014-11-21 ENCOUNTER — Ambulatory Visit: Payer: Self-pay | Admitting: Nurse Practitioner

## 2014-11-22 ENCOUNTER — Encounter: Payer: Self-pay | Admitting: Nurse Practitioner

## 2014-11-22 ENCOUNTER — Ambulatory Visit (INDEPENDENT_AMBULATORY_CARE_PROVIDER_SITE_OTHER): Payer: BLUE CROSS/BLUE SHIELD | Admitting: Nurse Practitioner

## 2014-11-22 VITALS — BP 96/60 | HR 76 | Resp 18 | Ht 67.0 in | Wt 172.0 lb

## 2014-11-22 DIAGNOSIS — B3731 Acute candidiasis of vulva and vagina: Secondary | ICD-10-CM

## 2014-11-22 DIAGNOSIS — B373 Candidiasis of vulva and vagina: Secondary | ICD-10-CM

## 2014-11-22 DIAGNOSIS — Z113 Encounter for screening for infections with a predominantly sexual mode of transmission: Secondary | ICD-10-CM

## 2014-11-22 LAB — POCT URINALYSIS DIPSTICK
Urobilinogen, UA: NEGATIVE
pH, UA: 5

## 2014-11-22 MED ORDER — FLUCONAZOLE 150 MG PO TABS: 150.0000 mg | ORAL_TABLET | Freq: Once | ORAL | Status: DC

## 2014-11-22 NOTE — Progress Notes (Signed)
Encounter reviewed by Dr. Keiosha Cancro Silva.  

## 2014-11-22 NOTE — Progress Notes (Signed)
24 y.o. S AA Fe  G1 here with complaint of vaginal symptoms of itching, burning, and increase discharge. Describes discharge as white and clear.  She has been on Augmentin for URI and within a few days the symptoms started.  Onset of symptoms 3 days ago. Denies new personal products or vaginal dryness. No STD concerns. Urinary symptoms none.  LMP 11/04/14 no current method of birth control other than condoms.  She is interested with her next AEX to pursue [facility] options.  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 She was also wanting to get STD's as per Dr. Brion Aliment recommendations for a repeat.   O:Healthy female WDWN Affect: normal, orientation x 3  Exam: alert, no distress Abdomen: soft and non tender Lymph node: no enlargement or tenderness Pelvic exam: External genital: no lesions, but irritation from scratching BUS: negative Vagina: thin white discharge noted.  Cervix: normal, non tender Uterus: normal, non tender Adnexa:normal, non tender, no masses or fullness noted  Affirm test is done GC & Chl is done  STD's are done   A: Most likely yeast vaginitis  R/O STD's   P: Discussed findings of the discharge and need to send labs. Discussed Aveeno or baking soda sitz bath for comfort. Avoid moist clothes or pads for extended period of time. If working out in gym clothes or swim suits for long periods of time change underwear or bottoms of swimsuit if possible. Olive Oil use for skin protection prior to activity can be used to external skin.  Rx: Given Diflucan 150 mg X 2  Will send her test results via my chart  RV prn

## 2014-11-22 NOTE — Patient Instructions (Signed)

## 2014-11-23 ENCOUNTER — Other Ambulatory Visit: Payer: Self-pay | Admitting: Nurse Practitioner

## 2014-11-23 LAB — STD PANEL
HIV 1&2 Ab, 4th Generation: NONREACTIVE
Hepatitis B Surface Ag: NEGATIVE

## 2014-11-23 LAB — WET PREP BY MOLECULAR PROBE
Candida species: NEGATIVE
Gardnerella vaginalis: POSITIVE — AB
Trichomonas vaginosis: NEGATIVE

## 2014-11-23 MED ORDER — METRONIDAZOLE 0.75 % VA GEL: 1.0000 | Freq: Every day | VAGINAL | Status: DC

## 2014-11-24 LAB — IPS N GONORRHOEA AND CHLAMYDIA BY PCR

## 2015-04-10 ENCOUNTER — Ambulatory Visit: Payer: BC Managed Care – PPO | Admitting: Gynecology

## 2015-04-10 ENCOUNTER — Ambulatory Visit: Payer: BC Managed Care – PPO | Admitting: Nurse Practitioner

## 2015-04-10 ENCOUNTER — Telehealth: Payer: Self-pay | Admitting: Nurse Practitioner

## 2015-04-10 NOTE — Telephone Encounter (Signed)
Patient canceled her aex today due to menses started. Patient rescheduled to 04/14/15.

## 2015-04-12 ENCOUNTER — Ambulatory Visit (INDEPENDENT_AMBULATORY_CARE_PROVIDER_SITE_OTHER): Payer: 59 | Admitting: Nurse Practitioner

## 2015-04-12 ENCOUNTER — Encounter: Payer: Self-pay | Admitting: Nurse Practitioner

## 2015-04-12 VITALS — BP 120/74 | HR 68 | Ht 66.75 in | Wt 160.0 lb

## 2015-04-12 DIAGNOSIS — Z113 Encounter for screening for infections with a predominantly sexual mode of transmission: Secondary | ICD-10-CM | POA: Diagnosis not present

## 2015-04-12 DIAGNOSIS — Z01419 Encounter for gynecological examination (general) (routine) without abnormal findings: Secondary | ICD-10-CM

## 2015-04-12 DIAGNOSIS — Z0000 Encounter for general adult medical examination without abnormal findings: Secondary | ICD-10-CM

## 2015-04-12 LAB — POCT URINALYSIS DIPSTICK
Bilirubin, UA: NEGATIVE
Blood, UA: NEGATIVE
Glucose, UA: NEGATIVE
Ketones, UA: NEGATIVE
Leukocytes, UA: NEGATIVE
Nitrite, UA: NEGATIVE
Protein, UA: NEGATIVE
Urobilinogen, UA: NEGATIVE
pH, UA: 5

## 2015-04-12 MED ORDER — NORETHIN ACE-ETH ESTRAD-FE 1-20 MG-MCG PO TABS: 1.0000 | ORAL_TABLET | Freq: Every day | ORAL | Status: DC

## 2015-04-12 NOTE — Patient Instructions (Signed)

## 2015-04-12 NOTE — Progress Notes (Signed)
Patient ID: Kelly [name], female   DOB: Jan 21, 1991, 24 y.o.   MRN: [id number] 24 y.o. G1P0010 Single  African American Fe here for annual exam.  Menses last 4 days. First 2 days of menses heavier with vomiting and fatigue.  She has not been SA in 3 months.  At last visit wanted to consider birth control options. She denies vaginal symptoms.  Patient's last menstrual period was 04/09/2015 (exact date).          Sexually active: Yes.   Not currently sexually active. The current method of family planning is none and abstinence.    Exercising: Yes.    walking and biking Smoker:  no  Health Maintenance: Pap:  04/06/14, ASCUS with pos HR HPV, Colpo 05/23/14, LGSIL TDaP:  UTD Gardasil:  age 24, but unsure if completed Labs:  HB:  11.4  Urine:  Negative    reports that she has never smoked. She has never used smokeless tobacco. She reports that she drinks alcohol. She reports that she does not use illicit drugs.  History reviewed. No pertinent past medical history.  History reviewed. No pertinent past surgical history.  Current Outpatient Prescriptions  Medication Sig Dispense Refill  . norethindrone-ethinyl estradiol (JUNEL FE,GILDESS FE,LOESTRIN FE) 1-20 MG-MCG tablet Take 1 tablet by mouth daily. 3 Package 0   No current facility-administered medications for this visit.    Family History  Problem Relation Age of Onset  . Diabetes Father   . Breast cancer Maternal Grandmother 43  . Pancreatic cancer Maternal Grandmother   . Hypertension Mother   . Hypertension Brother     ROS:  Pertinent items are noted in HPI.  Otherwise, a comprehensive ROS was negative.  Exam:   BP 120/74 mmHg  Pulse 68  Ht 5' 6.75" (1.695 m)  Wt 160 lb (72.576 kg)  BMI 25.26 kg/m2  LMP 04/09/2015 (Exact Date) Height: 5' 6.75" (169.5 cm) Ht Readings from Last 3 Encounters:  04/12/15 5' 6.75" (1.695 m)  11/22/14 5\' 7"  (1.702 m)  08/01/14 5\' 7"  (1.702 m)    General appearance: alert, cooperative and  appears stated age Head: Normocephalic, without obvious abnormality, atraumatic Neck: no adenopathy, supple, symmetrical, trachea midline and thyroid normal to inspection and palpation Lungs: clear to auscultation bilaterally Breasts: normal appearance, no masses or tenderness Heart: regular rate and rhythm Abdomen: soft, non-tender; no masses,  no organomegaly Extremities: extremities normal, atraumatic, no cyanosis or edema Skin: Skin color, texture, turgor normal. No rashes or lesions Lymph nodes: Cervical, supraclavicular, and axillary nodes normal. No abnormal inguinal nodes palpated Neurologic: Grossly normal   Pelvic: External genitalia:  no lesions              Urethra:  normal appearing urethra with no masses, tenderness or lesions              Bartholin's and Skene's: normal                 Vagina: normal appearing vagina with normal color and discharge, no lesions              Cervix: anteverted              Pap taken: Yes.   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  She prefers OCP.  Will start her on Loestrin Fe 1/20 - discussed start date, BUM, risk and side effects including DVT.  Will recheck in 3 months to see if doing OK  Will follow with pap and recommendations  Will follow with STD's   Counseled on breast self exam, use and side effects of OCP's, adequate intake of calcium and vitamin D, diet and exercise return annually or prn  An After  Visit Summary was printed and given to the patient.

## 2015-04-13 LAB — STD PANEL
HIV 1&2 Ab, 4th Generation: NONREACTIVE
Hepatitis B Surface Ag: NEGATIVE

## 2015-04-13 LAB — HEMOGLOBIN, FINGERSTICK: HEMOGLOBIN, FINGERSTICK: 11.4 g/dL — AB (ref 12.0–16.0)

## 2015-04-14 ENCOUNTER — Ambulatory Visit: Payer: BLUE CROSS/BLUE SHIELD | Admitting: Nurse Practitioner

## 2015-04-14 LAB — IPS PAP TEST WITH HPV

## 2015-04-14 LAB — IPS N GONORRHOEA AND CHLAMYDIA BY PCR

## 2015-04-14 NOTE — Progress Notes (Signed)
Encounter reviewed by Dr. Brook Amundson C. Silva.  

## 2015-04-18 ENCOUNTER — Telehealth: Payer: Self-pay | Admitting: Nurse Practitioner

## 2015-04-18 NOTE — Telephone Encounter (Signed)
Spoke with patient. Advised of results as seen below from [name], [name]. Patient is agreeable and verbalizes understanding. Aex scheduled for 04/12/2016 with [name], FNP . 08 recall entered.   Notes Recorded by Kem Boroughs, FNP on 04/14/2015 at 9:34 AM pap08 previous LGSIL and Colpo Notes Recorded by Kem Boroughs, FNP on 04/14/2015 at 8:41 AM Results via my chart:  Kelly [name], the STD panel with HIV, Hep B, syphilis, GC and Chlamydia was all negative.  Routing to provider for final review. Patient agreeable to disposition. Will close encounter.

## 2015-04-18 NOTE — Telephone Encounter (Signed)
Patient is calling for recent pap results. Last seen 04/12/15.

## 2015-06-20 ENCOUNTER — Telehealth: Payer: Self-pay | Admitting: *Deleted

## 2015-06-20 NOTE — Telephone Encounter (Signed)
Patient called to cancel her appointment for 07/14/15 with Ms. [name], patient said she is no longer taking the [facility] and just wants to keep up with her yearly appointments only.

## 2015-06-23 ENCOUNTER — Telehealth: Payer: Self-pay | Admitting: Nurse Practitioner

## 2015-06-23 ENCOUNTER — Ambulatory Visit: Payer: 59 | Admitting: Nurse Practitioner

## 2015-06-23 NOTE — Telephone Encounter (Signed)
Spoke with patient. Patient states that she sat on a public toilet seat today and noticed when she stood up there was "menstrual" blood on the toilet. States she does not have any cuts but is concerned Hep B. Patient is unaware if she has received a Hep B vaccination. Requesting to be seen today for evaluation. Appointment scheduled for today at 4 pm with Kem Boroughs, FNP. Patient is agreeable to date and time.  Routing to provider for final review. Patient agreeable to disposition. Will close encounter.

## 2015-06-23 NOTE — Telephone Encounter (Signed)
Left message to call Kaitlyn at [phone]. 

## 2015-06-23 NOTE — Telephone Encounter (Signed)
Patient says she sat on a public toilet and noticed blood like menstrual blood on her when she got up which came from the seat. She does not have any cuts but is still concerned and would like nurse to call.

## 2015-06-23 NOTE — Telephone Encounter (Signed)
Returned call

## 2015-06-23 NOTE — Telephone Encounter (Signed)
Patient canceled her appointment today for "lab testing". Patient spoke with her mom and was told she had the Hep B vaccine as a child. Patient feels that she no longer need labs done.

## 2015-06-29 ENCOUNTER — Other Ambulatory Visit: Payer: Self-pay | Admitting: Nurse Practitioner

## 2015-06-29 NOTE — Telephone Encounter (Signed)
Patient was given a call and message was left advising the patient that she will need an office visit to see how the OCP is working before refilling medication.

## 2015-06-29 NOTE — Telephone Encounter (Signed)
Patient was to come to see how OCP working for continuance per note

## 2015-06-29 NOTE — Telephone Encounter (Signed)
Medication refill request: Junel Fe Last AEX:  04/14/15 with PG Next AEX: 04/12/16 with PG Last MMG (if hormonal medication request):  Refill authorized: please advise

## 2015-07-05 IMAGING — US US OB COMP LESS 14 WK
1 series · 14 of 28 positions shown · non-contrast
Comparison: None.

CLINICAL DATA: Vaginal bleeding

EXAM:
OBSTETRIC <14 WK US AND TRANSVAGINAL OB US
TECHNIQUE: Both transabdominal and transvaginal ultrasound examinations were
performed for complete evaluation of the gestation as well as the
maternal uterus, adnexal regions, and pelvic cul-de-sac.
Transvaginal technique was performed to assess early pregnancy.

[Series 1: us ob comp less 14 wk · 0.20mm/px · 14 of 39 slices shown]
[im 2/39]
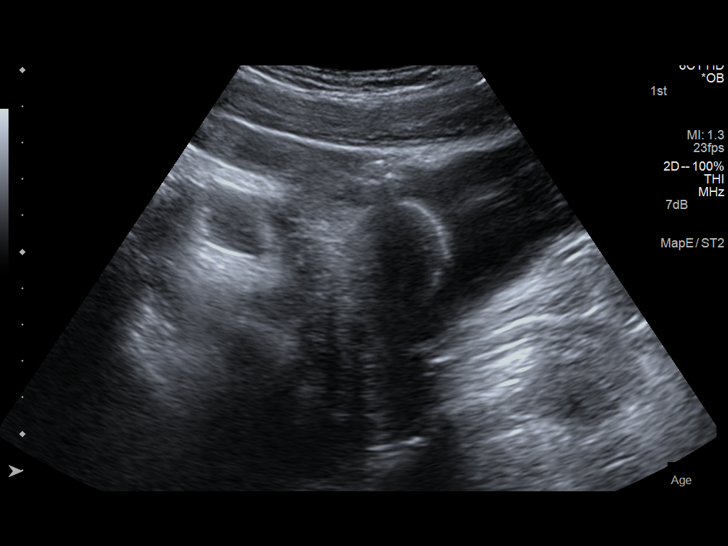
[im 5/39]
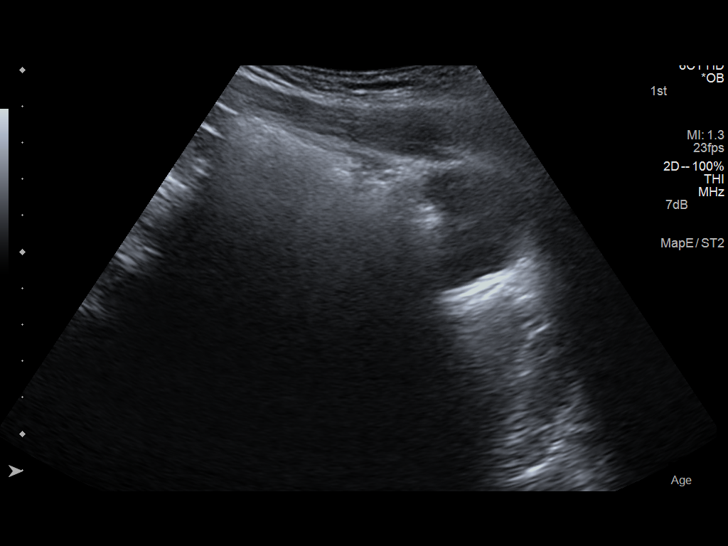
[im 8/39]
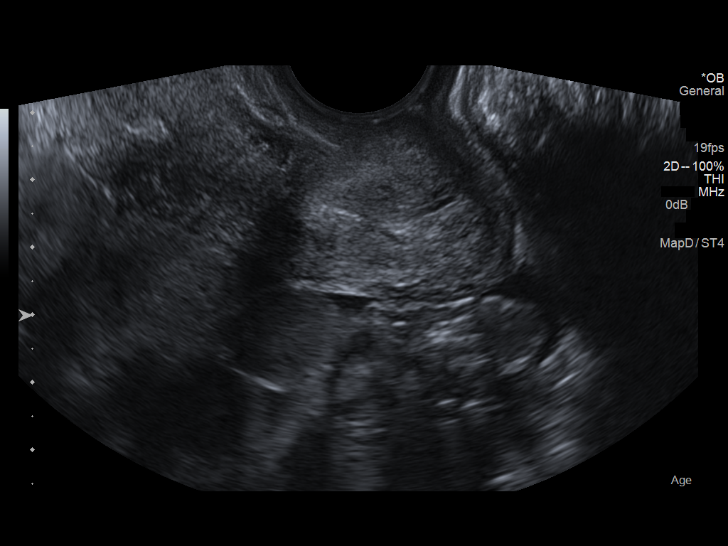
[im 10/39]
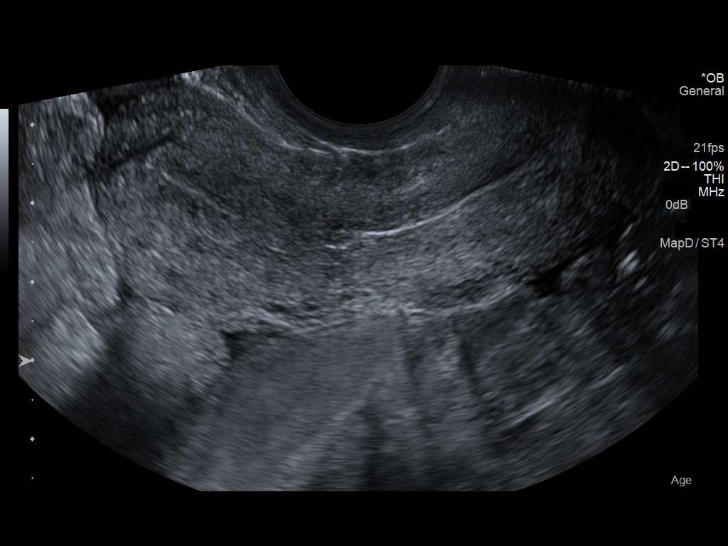
[im 13/39]
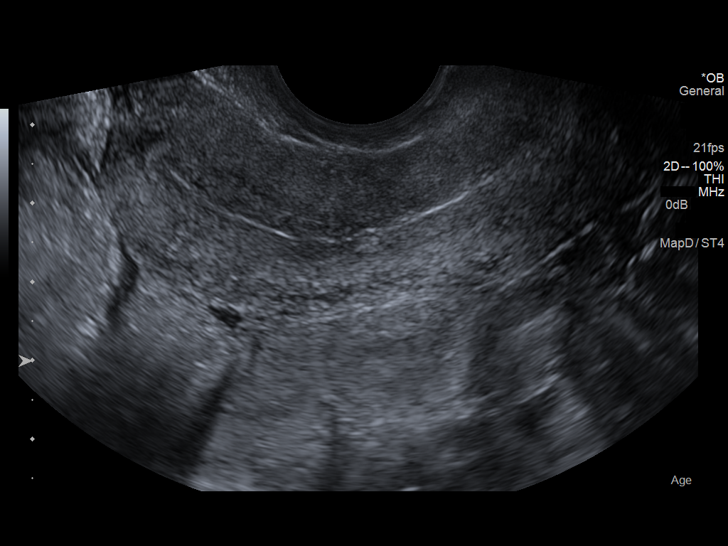
[im 16/39]
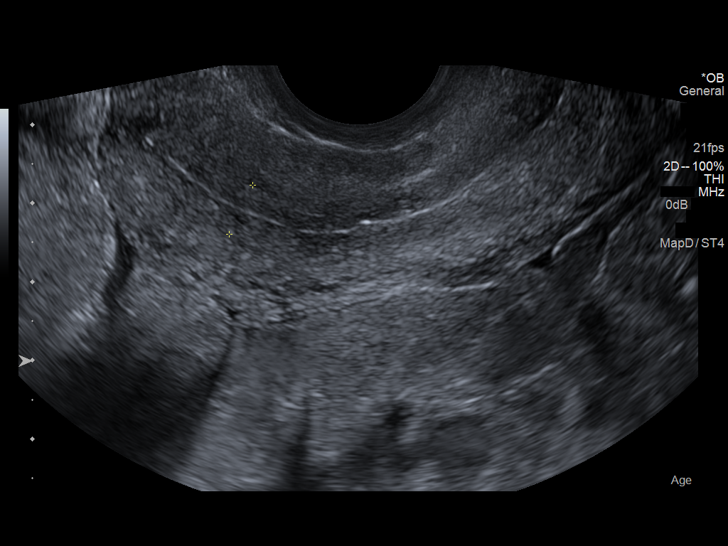
[im 19/39]
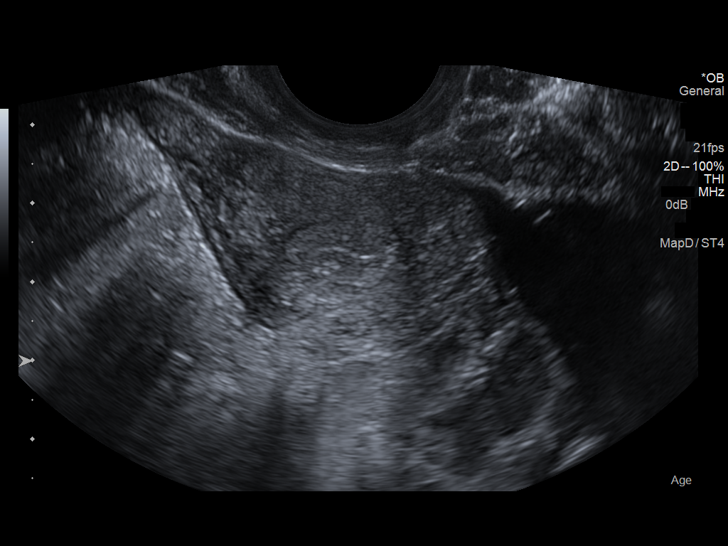
[im 22/39]
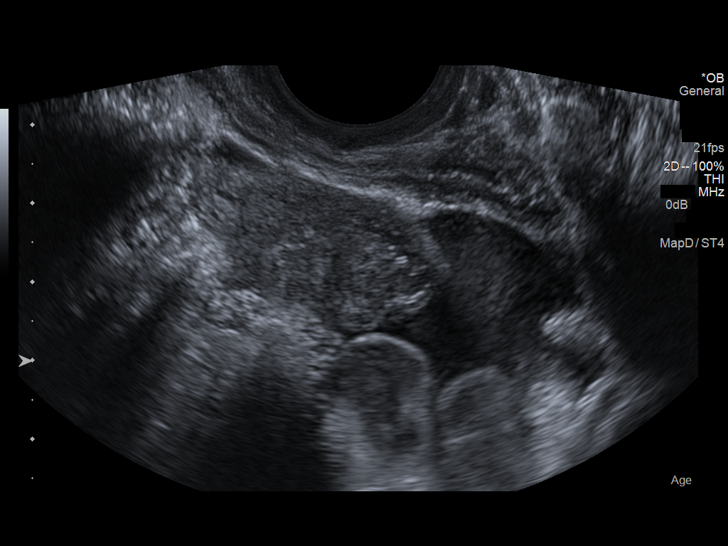
[im 24/39]
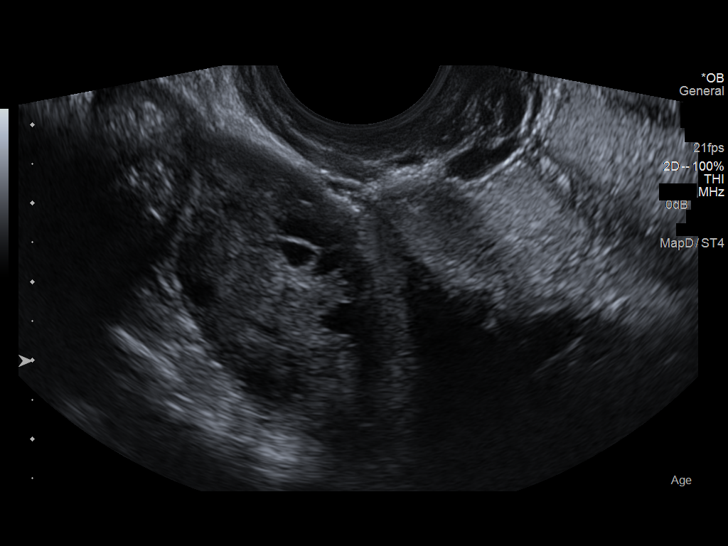
[im 27/39]
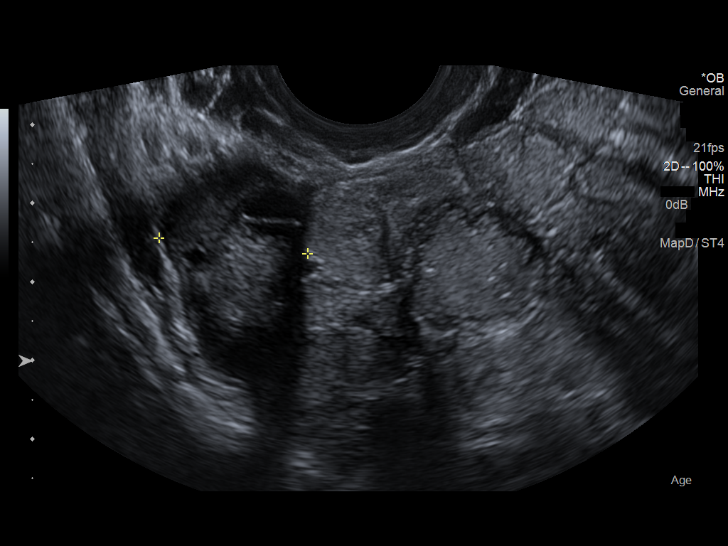
[im 30/39]
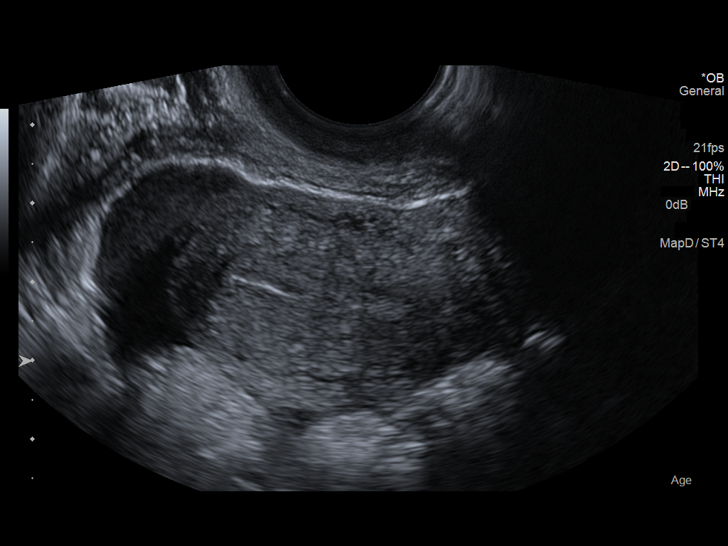
[im 33/39]
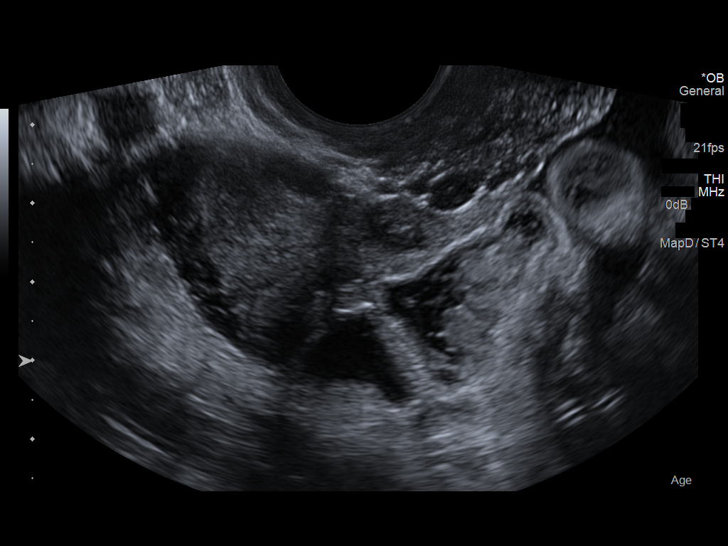
[im 36/39]
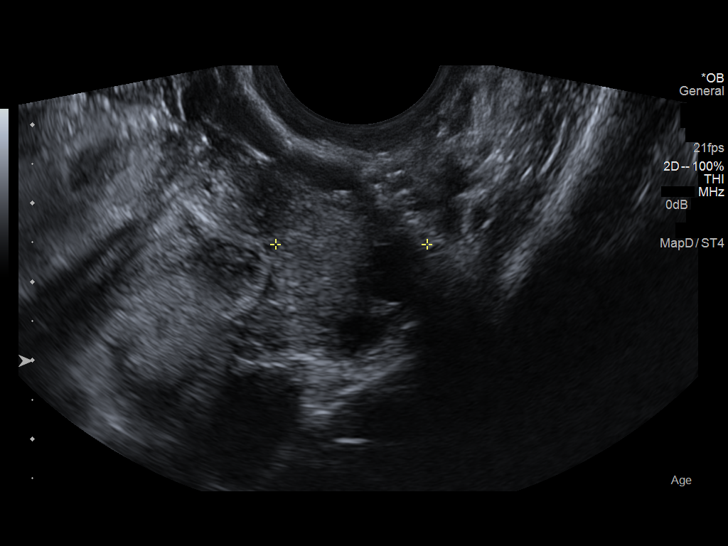
[im 39/39]
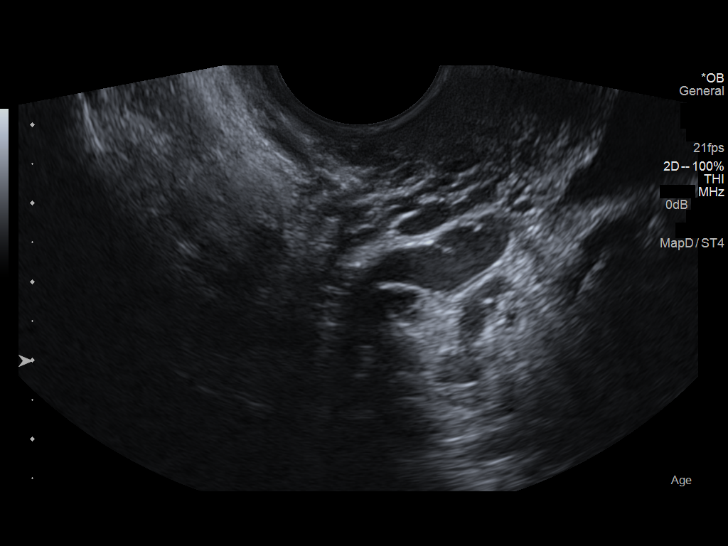

[14 of 28 positions shown; findings below may reference images not displayed]

FINDINGS: Intrauterine gestational sac: None visualized

Yolk sac:  None visualized

Embryo:  None visualized

Cardiac Activity: None visualized

Heart Rate:   bpm

MSD:    mm    w     d

CRL:     mm    w  d                  US EDC:

Maternal uterus/adnexae: Ovaries are symmetric in size and
echotexture. No adnexal masses. Small amount of free fluid in the
pelvis which appears mildly complex.
IMPRESSION: No intrauterine pregnancy visualized. Differential considerations
would include early intrauterine pregnancy too early to visualize,
spontaneous abortion, or occult ectopic pregnancy. Recommend close
clinical followup and serial quantitative beta HCGs and ultrasounds.
Repeat ultrasound in 10-14 days may be helpful.

## 2015-07-14 ENCOUNTER — Ambulatory Visit: Payer: 59 | Admitting: Nurse Practitioner

## 2016-02-15 ENCOUNTER — Telehealth: Payer: Self-pay | Admitting: Nurse Practitioner

## 2016-02-15 NOTE — Telephone Encounter (Signed)
Patient states that her cycle has started early and would like to talk to a nurse.

## 2016-02-15 NOTE — Telephone Encounter (Signed)
Call to patient. She states her cycle is normally 28-30 days apart. Had a cycle on 01/20/16. Now has started her cycle on 02/14/16. No heavy bleeding, she states this is normal bleeding for her. She denies pain.  She is asking if this is okay to have an earlier cycle than normal. She is not currently sexually active, has not been sexually active for 3 months. Has no concerns regarding STD's. She states she has been under a lot of stress recently and feels this may have contributed. She is also eating more than usual due to stress. She is not on contraception., she decided not to fill her prescription that was written by Kem Boroughs, FNP because she was worried about mood swings.  Discussed cycles with patient. She will continue to monitor cycles and return call if any heavy bleeding or continued cycles that are more frequent. Patient also thinks she may have had a yeast infection but treated at home with Monistat 1. She is having some vaginal irritation. She declines appointment at this time because she states she does not currently have medical coverage. She is advised to call back with continued irritation or any concerns.   Routing to [employer] CNM as covering.  Okay to close?

## 2016-02-16 NOTE — Telephone Encounter (Signed)
Agree with plan 

## 2016-03-19 ENCOUNTER — Encounter (HOSPITAL_COMMUNITY): Payer: Self-pay | Admitting: Emergency Medicine

## 2016-03-19 ENCOUNTER — Emergency Department (HOSPITAL_COMMUNITY)
Admission: EM | Admit: 2016-03-19 | Discharge: 2016-03-19 | Disposition: A | Discharge status: Home / Self Care | Payer: Self-pay | Attending: Emergency Medicine | Admitting: Emergency Medicine

## 2016-03-19 DIAGNOSIS — J029 Acute pharyngitis, unspecified: Secondary | ICD-10-CM

## 2016-03-19 DIAGNOSIS — B9789 Other viral agents as the cause of diseases classified elsewhere: Secondary | ICD-10-CM | POA: Insufficient documentation

## 2016-03-19 DIAGNOSIS — J028 Acute pharyngitis due to other specified organisms: Secondary | ICD-10-CM | POA: Insufficient documentation

## 2016-03-19 LAB — RAPID STREP SCREEN (MED CTR MEBANE ONLY): Streptococcus, Group A Screen (Direct): NEGATIVE

## 2016-03-19 MED ORDER — DEXAMETHASONE SODIUM PHOSPHATE 10 MG/ML IJ SOLN
10.0000 mg | Freq: Once | INTRAMUSCULAR | Status: AC
  Administered 2016-03-19: 10 mg via INTRAMUSCULAR
  Filled 2016-03-19: qty 1

## 2016-03-19 NOTE — Discharge Instructions (Signed)
Kelly [name],  [place] meeting you! Please follow-up with Ear, Nose and Throat as needed. Return to the emergency department if you develop increased pain, shortness of breath, drooling, new/worsening symptoms. Feel better soon!  S. Wendie Simmer, PA-C Sore Throat A sore throat is pain, burning, irritation, or scratchiness of the throat. There is often pain or tenderness when swallowing or talking. A sore throat may be accompanied by other symptoms, such as coughing, sneezing, fever, and swollen neck glands. A sore throat is often the first sign of another sickness, such as a cold, flu, strep throat, or mononucleosis (commonly known as mono). Most sore throats go away without medical treatment. CAUSES  The most common causes of a sore throat include:  A viral infection, such as a cold, flu, or mono.  A bacterial infection, such as strep throat, tonsillitis, or whooping cough.  Seasonal allergies.  Dryness in the air.  Irritants, such as smoke or pollution.  Gastroesophageal reflux disease (GERD). HOME CARE INSTRUCTIONS   Only take over-the-counter medicines as directed by your caregiver.  Drink enough fluids to keep your urine clear or pale yellow.  Rest as needed.  Try using throat sprays, lozenges, or sucking on hard candy to ease any pain (if older than 4 years or as directed).  Sip warm liquids, such as broth, herbal tea, or warm water with honey to relieve pain temporarily. You may also eat or drink cold or frozen liquids such as frozen ice pops.  Gargle with salt water (mix 1 tsp salt with 8 oz of water).  Do not smoke and avoid secondhand smoke.  Put a cool-mist humidifier in your bedroom at night to moisten the air. You can also turn on a hot shower and sit in the bathroom with the door closed for 5-10 minutes. SEEK IMMEDIATE MEDICAL CARE IF:  You have difficulty breathing.  You are unable to swallow fluids, soft foods, or your saliva.  You have increased  swelling in the throat.  Your sore throat does not get better in 7 days.  You have nausea and vomiting.  You have a fever or persistent symptoms for more than 2-3 days.  You have a fever and your symptoms suddenly get worse. MAKE SURE YOU:   Understand these instructions.  Will watch your condition.  Will get help right away if you are not doing well or get worse.   This information is not intended to replace advice given to you by your health care provider. Make sure you discuss any questions you have with your health care provider.   Document Released: 10/24/2004 Document Revised: 10/07/2014 Document Reviewed: 05/24/2012 Elsevier Interactive Patient Education [employer].

## 2016-03-19 NOTE — ED Notes (Signed)
Pt. reports sore throat onset last month with occasional productive cough and mild headache , denies fever or chills , respirations unlabored.

## 2016-03-22 LAB — CULTURE, GROUP A STREP (THRC)

## 2016-03-29 NOTE — ED Provider Notes (Signed)
CSN: [id number]     Arrival date & time 03/19/16  2133 History   First MD Initiated Contact with Patient 03/19/16 2152     Chief Complaint  Patient presents with  . Sore Throat   HPI   Kelly [name] is a 25 y.o. female with no significant PMH presenting with a 1 month history of sore throat, intermittent productive cough, mild headache. She denies fevers, chills, N/V, changes in bowel/bladder habits, drooling, voice changes.  History reviewed. No pertinent past medical history. History reviewed. No pertinent past surgical history. Family History  Problem Relation Age of Onset  . Diabetes Father   . Breast cancer Maternal Grandmother 36  . Pancreatic cancer Maternal Grandmother   . Hypertension Mother   . Hypertension Brother    Social History  Substance Use Topics  . Smoking status: Never Smoker   . Smokeless tobacco: Never Used  . Alcohol Use: Yes   OB History    Gravida Para Term Preterm AB TAB SAB Ectopic Multiple Living   1 0   1  1        Review of Systems  Ten systems are reviewed and are negative for acute change except as noted in the HPI =  Allergies  Review of patient's allergies indicates no known allergies.  Home Medications   Prior to Admission medications   Not on File   BP 127/80 mmHg  Pulse 96  Temp(Src) 98.4 F (36.9 C) (Oral)  Resp 14  Ht 5\' 7"  (1.702 m)  Wt 78.926 kg  BMI 27.25 kg/m2  SpO2 100%  LMP 02/26/2016 Physical Exam  Constitutional: She appears well-developed and well-nourished. No distress.  HENT:  Head: Normocephalic and atraumatic.  Right Ear: External ear normal.  Left Ear: External ear normal.  Nose: Nose normal.  Mouth/Throat: Oropharynx is clear and moist. No oropharyngeal exudate.  Eyes: Conjunctivae are normal. Pupils are equal, round, and reactive to light. Right eye exhibits no discharge. Left eye exhibits no discharge. No scleral icterus.  Neck: Normal range of motion. Neck supple. No tracheal deviation  present.  Cardiovascular: Normal rate, regular rhythm, normal heart sounds and intact distal pulses.  Exam reveals no gallop and no friction rub.   No murmur heard. Pulmonary/Chest: Effort normal and breath sounds normal. No respiratory distress. She has no wheezes. She has no rales. She exhibits no tenderness.  Abdominal: Soft. She exhibits no distension.  Musculoskeletal: She exhibits no edema.  Lymphadenopathy:    She has no cervical adenopathy.  Neurological: She is alert. Coordination normal.  Skin: Skin is warm and dry. No rash noted. She is not diaphoretic. No erythema.  Psychiatric: She has a normal mood and affect. Her behavior is normal.  Nursing note and vitals reviewed.   ED Course  Procedures  Labs Review Labs Reviewed  RAPID STREP SCREEN (NOT AT [facility])  CULTURE, GROUP A STREP [facility])   MDM   Final diagnoses:  Sore throat   Pt afebrile without tonsillar exudate, negative strep. Presents with mild cervical lymphadenopathy, & dysphagia; diagnosis of viral pharyngitis. No abx indicated. DC w symptomatic tx for pain  Pt does not appear dehydrated, but did discuss importance of water rehydration. Presentation non concerning for PTA or infxn spread to soft tissue. No trismus or uvula deviation. Specific return precautions discussed. Pt able to drink water in ED without difficulty with intact air way. Recommended PCP follow up.  Berwick Lions, PA-C 03/29/16 [id number]  Pattricia Boss, MD 04/06/16 [phone]

## 2016-04-12 ENCOUNTER — Encounter: Payer: Self-pay | Admitting: Nurse Practitioner

## 2016-04-12 ENCOUNTER — Ambulatory Visit: Payer: 59 | Admitting: Nurse Practitioner

## 2016-05-02 ENCOUNTER — Telehealth: Payer: Self-pay | Admitting: *Deleted

## 2016-05-02 NOTE — Telephone Encounter (Signed)
Patient is on 08 recall for 03/2016. She no showed for her appointment. Please contact patient to schedule for AEX/PAP Thanks Margaretha Sheffield

## 2016-05-09 NOTE — Telephone Encounter (Signed)
Annual exam scheduled for 05/10/16 @ 3:30 pm with Edman Circle, FNP.

## 2016-05-10 ENCOUNTER — Ambulatory Visit: Payer: Self-pay | Admitting: Nurse Practitioner

## 2016-05-10 ENCOUNTER — Encounter: Payer: Self-pay | Admitting: Nurse Practitioner

## 2016-05-13 NOTE — Telephone Encounter (Signed)
Pt aware of need for pap due to recently scheduled appt.  She has no showed two appts.  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  Ok to send letter and remove from recall.   CC:  Kelly [name].  Pt needs letter about no showing 2 appts as well.  Thanks.

## 2016-05-13 NOTE — Telephone Encounter (Signed)
Dr. Sabra Heck This was contacted regarding her 08 recall- she was scheduled got 05-10-16. She did not show for this appointment- Please advise recall status Thanks Margaretha Sheffield

## 2016-05-14 ENCOUNTER — Encounter: Payer: Self-pay | Admitting: *Deleted

## 2016-05-14 NOTE — Telephone Encounter (Signed)
Letter sent to patient- patient removed from recall -eh

## 2016-05-30 ENCOUNTER — Emergency Department (HOSPITAL_COMMUNITY)
Admission: EM | Admit: 2016-05-30 | Discharge: 2016-05-30 | Disposition: A | Discharge status: Home / Self Care | Payer: Self-pay | Attending: Emergency Medicine | Admitting: Emergency Medicine

## 2016-05-30 ENCOUNTER — Encounter (HOSPITAL_COMMUNITY): Payer: Self-pay

## 2016-05-30 DIAGNOSIS — J069 Acute upper respiratory infection, unspecified: Secondary | ICD-10-CM | POA: Insufficient documentation

## 2016-05-30 DIAGNOSIS — J0110 Acute frontal sinusitis, unspecified: Secondary | ICD-10-CM | POA: Insufficient documentation

## 2016-05-30 DIAGNOSIS — H9201 Otalgia, right ear: Secondary | ICD-10-CM | POA: Insufficient documentation

## 2016-05-30 MED ORDER — PROMETHAZINE-DM 6.25-15 MG/5ML PO SYRP: 5.0000 mL | ORAL_SOLUTION | Freq: Four times a day (QID) | ORAL | 0 refills | Status: DC | PRN

## 2016-05-30 MED ORDER — AMOXICILLIN-POT CLAVULANATE 875-125 MG PO TABS: 1.0000 | ORAL_TABLET | Freq: Two times a day (BID) | ORAL | 0 refills | Status: DC

## 2016-05-30 MED ORDER — DOCUSATE SODIUM 50 MG/5ML PO LIQD
50.0000 mg | Freq: Once | ORAL | Status: AC
  Administered 2016-05-30: 50 mg via ORAL
  Filled 2016-05-30: qty 10

## 2016-05-30 MED ORDER — IBUPROFEN 400 MG PO TABS
600.0000 mg | ORAL_TABLET | Freq: Once | ORAL | Status: AC
  Administered 2016-05-30: 600 mg via ORAL
  Filled 2016-05-30: qty 1

## 2016-05-30 MED ORDER — FLUTICASONE PROPIONATE 50 MCG/ACT NA SUSP: 2.0000 | Freq: Every day | NASAL | 0 refills | Status: DC

## 2016-05-30 NOTE — ED Provider Notes (Signed)
[facility] DEPT Provider Note   CSN: [id number] Arrival date & time: 05/30/16  1010  By signing my name below, I, Estanislado Pandy, attest that this documentation has been prepared under the direction and in the presence of Delrae Rend, [place] Electronically Signed: Estanislado Pandy, Scribe. 05/30/2016. 10:44 AM.   History   Chief Complaint Chief Complaint  Patient presents with  . Sore Throat   The history is provided by the patient. No language interpreter was used.  HPI Comments:  Kelly [name] is a 25 y.o. female who presents to the Emergency Department complaining of sudden onset, worsening sore throat x2 weeks and ear pain onset 12 hours. Pt reports that she has had generalized cold symptoms x2 weeks, but began feeling congestion and pain in her R ear last night. Pt complains of associated productive cough, sore throat, congestion, difficulty hearing, and sinus pressure. Pt has taken Aleve and Mucinex without relief. Pt denies drainage from the ear  History reviewed. No pertinent past medical history.  Patient Active Problem List   Diagnosis Date Noted  . ASCUS with positive high risk HPV 05/13/2014  . Adjustment disorder with mixed anxiety and depressed mood 04/12/2014    History reviewed. No pertinent surgical history.  OB History    Gravida Para Term Preterm AB Living   1 0     1     SAB TAB Ectopic Multiple Live Births   1               Home Medications    Prior to Admission medications   Not on File    Family History Family History  Problem Relation Age of Onset  . Diabetes Father   . Breast cancer Maternal Grandmother 48  . Pancreatic cancer Maternal Grandmother   . Hypertension Mother   . Hypertension Brother     Social History Social History  Substance Use Topics  . Smoking status: Never Smoker  . Smokeless tobacco: Never Used  . Alcohol use No     Allergies   Review of patient's allergies indicates no known allergies.   Review  of Systems Review of Systems 10 Systems reviewed and are negative for acute change except as noted in the HPI.   Physical Exam Updated Vital Signs BP 128/78 (BP Location: Right Arm)   Pulse 71   Temp 98.7 F (37.1 C) (Oral)   Resp 20   Ht 5\' 7"  (1.702 m)   Wt 170 lb (77.1 kg)   LMP 05/13/2016   SpO2 100%   BMI 26.63 kg/m   Physical Exam  Constitutional: She appears well-developed and well-nourished. No distress.  HENT:  Head: Normocephalic and atraumatic.  +frontal sinus ttp Nose normal No oropharyngeal lesions. No tonsillar exudate. Posterior oropharynx mildly erythematous L ear with moderate cerumen. Visible portion of TM unremarakble. External ear unremarkable  R ear with impacted cerumen obstructing TM. +tragal tenderness  Eyes: Conjunctivae and EOM are normal. Pupils are equal, round, and reactive to light.  Neck: Normal range of motion.  Cardiovascular: Normal rate, regular rhythm and normal heart sounds.   Pulmonary/Chest: Effort normal and breath sounds normal. No respiratory distress. She has no wheezes. She has no rales.  Abdominal: She exhibits no distension. There is no tenderness.  Lymphadenopathy:    She has no cervical adenopathy.  Neurological: She is alert.  Skin: Skin is warm and dry. No rash noted.  Psychiatric: She has a normal mood and affect.  Nursing note and vitals reviewed.  ED Treatments / Results  DIAGNOSTIC STUDIES:  Oxygen Saturation is 100% on RA, normal by my interpretation.    COORDINATION OF CARE:  10:44 AM Discussed treatment plan with pt at bedside and pt agreed to plan.  Labs (all labs ordered are listed, but only abnormal results are displayed) Labs Reviewed - No data to display  EKG  EKG Interpretation None       Radiology No results found.  Procedures Procedures (including critical care time)  Medications Ordered in ED Medications - No data to display   Initial Impression / Assessment and Plan / ED Course   I have reviewed the triage vital signs and the nursing notes.  Pertinent labs & imaging results that were available during my care of the patient were reviewed by me and considered in my medical decision making (see chart for details).  Clinical Course    10:55 AM Cannot visualize right TM due to cerumen impaction. Will order colace and flush both ears and reassess.   After ear irrigation cerumen has been removed. There is now visible a right ear effusion with mild injection of the TM but does not appear purulent and wtihout TM bulging or retraction. However, given 2 weeks of sinusitis and URI symptoms will cover with Augmentin. Rx for supportive meds given as well. Instructed f/u with ENT if symptoms persist after completion of antibiotic therapy. ER return precautions given.  Final Clinical Impressions(s) / ED Diagnoses   Final diagnoses:  Acute frontal sinusitis, recurrence not specified  Right ear pain  URI (upper respiratory infection)    New Prescriptions Discharge Medication List as of 05/30/2016 12:04 PM    START taking these medications   Details  amoxicillin-clavulanate (AUGMENTIN) 875-125 MG tablet Take 1 tablet by mouth every 12 (twelve) hours., Starting Thu 05/30/2016, Print    fluticasone (FLONASE) 50 MCG/ACT nasal spray Place 2 sprays into both nostrils daily., Starting Thu 05/30/2016, Print    promethazine-dextromethorphan (PROMETHAZINE-DM) 6.25-15 MG/5ML syrup Take 5 mLs by mouth 4 (four) times daily as needed for cough., Starting Thu 05/30/2016, Print       I personally performed the services described in this documentation, which was scribed in my presence. The recorded information has been reviewed and is accurate.   Anne Ng, PA-C 05/30/16 [place], MD 05/31/16 [phone]

## 2016-05-30 NOTE — ED Notes (Signed)
Sore throat. Sinus pressure and ear pain and pressure that has been going on for 2 weeks

## 2016-05-30 NOTE — ED Notes (Signed)
Instructions reviewed. Pt. Ambulatory and reports decreased pain. Denies any questions

## 2016-05-30 NOTE — Discharge Instructions (Signed)
Take antibiotics as prescribed. Follow up with ENT if your symptoms persist.

## 2016-07-22 ENCOUNTER — Encounter: Payer: Self-pay | Admitting: Obstetrics and Gynecology

## 2016-08-12 ENCOUNTER — Encounter: Payer: Self-pay | Admitting: Obstetrics and Gynecology

## 2020-01-12 DIAGNOSIS — Z7251 High risk heterosexual behavior: Secondary | ICD-10-CM | POA: Diagnosis not present

## 2020-01-14 DIAGNOSIS — Z113 Encounter for screening for infections with a predominantly sexual mode of transmission: Secondary | ICD-10-CM | POA: Diagnosis not present

## 2020-01-14 DIAGNOSIS — Z118 Encounter for screening for other infectious and parasitic diseases: Secondary | ICD-10-CM | POA: Diagnosis not present

## 2020-01-14 DIAGNOSIS — Z124 Encounter for screening for malignant neoplasm of cervix: Secondary | ICD-10-CM | POA: Diagnosis not present

## 2020-03-06 ENCOUNTER — Other Ambulatory Visit: Payer: Self-pay | Admitting: Obstetrics and Gynecology

## 2020-03-06 DIAGNOSIS — N6002 Solitary cyst of left breast: Secondary | ICD-10-CM

## 2020-03-07 ENCOUNTER — Ambulatory Visit
Admission: RE | Admit: 2020-03-07 | Discharge: 2020-03-07 | Disposition: A | Discharge status: Home / Self Care | Payer: 59 | Source: Ambulatory Visit | Attending: Obstetrics and Gynecology | Admitting: Obstetrics and Gynecology

## 2020-03-07 ENCOUNTER — Other Ambulatory Visit: Payer: Self-pay | Admitting: Obstetrics and Gynecology

## 2020-03-07 ENCOUNTER — Other Ambulatory Visit: Payer: Self-pay

## 2020-03-07 DIAGNOSIS — N6323 Unspecified lump in the left breast, lower outer quadrant: Secondary | ICD-10-CM | POA: Diagnosis not present

## 2020-03-07 DIAGNOSIS — D242 Benign neoplasm of left breast: Secondary | ICD-10-CM

## 2020-03-07 DIAGNOSIS — N6002 Solitary cyst of left breast: Secondary | ICD-10-CM

## 2020-05-08 ENCOUNTER — Other Ambulatory Visit: Payer: Self-pay | Admitting: Obstetrics and Gynecology

## 2020-05-08 DIAGNOSIS — N6002 Solitary cyst of left breast: Secondary | ICD-10-CM

## 2020-05-08 DIAGNOSIS — N6001 Solitary cyst of right breast: Secondary | ICD-10-CM

## 2020-05-12 ENCOUNTER — Ambulatory Visit
Admission: RE | Admit: 2020-05-12 | Discharge: 2020-05-12 | Disposition: A | Discharge status: Home / Self Care | Payer: 59 | Source: Ambulatory Visit | Attending: Obstetrics and Gynecology | Admitting: Obstetrics and Gynecology

## 2020-05-12 ENCOUNTER — Other Ambulatory Visit: Payer: Self-pay

## 2020-05-12 DIAGNOSIS — N6001 Solitary cyst of right breast: Secondary | ICD-10-CM

## 2020-05-12 DIAGNOSIS — N6323 Unspecified lump in the left breast, lower outer quadrant: Secondary | ICD-10-CM | POA: Diagnosis not present

## 2020-05-12 DIAGNOSIS — D242 Benign neoplasm of left breast: Secondary | ICD-10-CM | POA: Diagnosis not present

## 2020-05-12 DIAGNOSIS — N6002 Solitary cyst of left breast: Secondary | ICD-10-CM

## 2020-05-19 ENCOUNTER — Ambulatory Visit (INDEPENDENT_AMBULATORY_CARE_PROVIDER_SITE_OTHER): Payer: 59

## 2020-05-19 ENCOUNTER — Ambulatory Visit (INDEPENDENT_AMBULATORY_CARE_PROVIDER_SITE_OTHER): Payer: 59 | Admitting: Orthopaedic Surgery

## 2020-05-19 ENCOUNTER — Encounter: Payer: Self-pay | Admitting: Physician Assistant

## 2020-05-19 VITALS — Ht 67.0 in | Wt 164.0 lb

## 2020-05-19 DIAGNOSIS — M25562 Pain in left knee: Secondary | ICD-10-CM

## 2020-05-19 DIAGNOSIS — G8929 Other chronic pain: Secondary | ICD-10-CM

## 2020-05-19 MED ORDER — TRAMADOL HCL 50 MG PO TABS: 50.0000 mg | ORAL_TABLET | Freq: Two times a day (BID) | ORAL | 0 refills | Status: DC | PRN

## 2020-05-19 MED ORDER — DICLOFENAC SODIUM 75 MG PO TBEC: 75.0000 mg | DELAYED_RELEASE_TABLET | Freq: Two times a day (BID) | ORAL | 1 refills | Status: DC | PRN

## 2020-06-07 ENCOUNTER — Other Ambulatory Visit: Payer: 59

## 2020-06-09 ENCOUNTER — Other Ambulatory Visit: Payer: Self-pay

## 2020-06-09 ENCOUNTER — Telehealth: Payer: Self-pay

## 2020-06-09 ENCOUNTER — Ambulatory Visit
Admission: RE | Admit: 2020-06-09 | Discharge: 2020-06-09 | Disposition: A | Discharge status: Home / Self Care | Payer: 59 | Source: Ambulatory Visit | Attending: Physician Assistant | Admitting: Physician Assistant

## 2020-06-09 DIAGNOSIS — M23352 Other meniscus derangements, posterior horn of lateral meniscus, left knee: Secondary | ICD-10-CM | POA: Diagnosis not present

## 2020-06-09 DIAGNOSIS — M23312 Other meniscus derangements, anterior horn of medial meniscus, left knee: Secondary | ICD-10-CM | POA: Diagnosis not present

## 2020-06-09 DIAGNOSIS — G8929 Other chronic pain: Secondary | ICD-10-CM

## 2020-06-09 MED ORDER — NABUMETONE 500 MG PO TABS: 500.0000 mg | ORAL_TABLET | Freq: Two times a day (BID) | ORAL | 3 refills | Status: DC | PRN

## 2020-06-20 ENCOUNTER — Telehealth: Payer: Self-pay

## 2020-06-20 ENCOUNTER — Other Ambulatory Visit: Payer: Self-pay

## 2020-06-20 ENCOUNTER — Ambulatory Visit (INDEPENDENT_AMBULATORY_CARE_PROVIDER_SITE_OTHER): Payer: 59 | Admitting: Orthopaedic Surgery

## 2020-06-20 ENCOUNTER — Other Ambulatory Visit: Payer: Self-pay | Admitting: Physician Assistant

## 2020-06-20 ENCOUNTER — Encounter: Payer: Self-pay | Admitting: Orthopaedic Surgery

## 2020-06-20 VITALS — Ht 67.0 in | Wt 160.0 lb

## 2020-06-20 DIAGNOSIS — S83242A Other tear of medial meniscus, current injury, left knee, initial encounter: Secondary | ICD-10-CM | POA: Insufficient documentation

## 2020-06-20 DIAGNOSIS — S83512A Sprain of anterior cruciate ligament of left knee, initial encounter: Secondary | ICD-10-CM | POA: Insufficient documentation

## 2020-06-20 HISTORY — DX: Sprain of anterior cruciate ligament of left knee, initial encounter: S83.512A

## 2020-06-20 HISTORY — DX: Other tear of medial meniscus, current injury, left knee, initial encounter: S83.242A

## 2020-06-20 MED ORDER — HYDROCODONE-ACETAMINOPHEN 5-325 MG PO TABS: 1.0000 | ORAL_TABLET | Freq: Every day | ORAL | 0 refills | Status: DC | PRN

## 2020-06-21 ENCOUNTER — Telehealth: Payer: Self-pay

## 2020-06-21 ENCOUNTER — Other Ambulatory Visit: Payer: Self-pay

## 2020-06-22 ENCOUNTER — Other Ambulatory Visit: Payer: Self-pay

## 2020-06-22 ENCOUNTER — Telehealth: Payer: Self-pay

## 2020-06-23 ENCOUNTER — Encounter (HOSPITAL_BASED_OUTPATIENT_CLINIC_OR_DEPARTMENT_OTHER): Payer: Self-pay | Admitting: Orthopaedic Surgery

## 2020-06-23 ENCOUNTER — Other Ambulatory Visit: Payer: Self-pay

## 2020-06-26 ENCOUNTER — Telehealth: Payer: Self-pay | Admitting: Orthopaedic Surgery

## 2020-06-27 ENCOUNTER — Other Ambulatory Visit (HOSPITAL_COMMUNITY)
Admission: RE | Admit: 2020-06-27 | Discharge: 2020-06-27 | Disposition: A | Discharge status: Home / Self Care | Payer: 59 | Source: Ambulatory Visit | Attending: Orthopaedic Surgery | Admitting: Orthopaedic Surgery

## 2020-06-27 ENCOUNTER — Telehealth: Payer: Self-pay | Admitting: Orthopaedic Surgery

## 2020-06-27 DIAGNOSIS — Z20822 Contact with and (suspected) exposure to covid-19: Secondary | ICD-10-CM | POA: Diagnosis not present

## 2020-06-27 DIAGNOSIS — Z01812 Encounter for preprocedural laboratory examination: Secondary | ICD-10-CM | POA: Insufficient documentation

## 2020-06-27 LAB — SARS CORONAVIRUS 2 (TAT 6-24 HRS): SARS Coronavirus 2: NEGATIVE

## 2020-06-27 NOTE — Telephone Encounter (Signed)
Patient called.   She is requesting a call back to answer some post op questions she has.   Call back: [phone]

## 2020-06-30 ENCOUNTER — Ambulatory Visit (HOSPITAL_BASED_OUTPATIENT_CLINIC_OR_DEPARTMENT_OTHER)
Admission: RE | Admit: 2020-06-30 | Discharge: 2020-06-30 | Disposition: A | Discharge status: Home / Self Care | Payer: 59 | Attending: Orthopaedic Surgery | Admitting: Orthopaedic Surgery

## 2020-06-30 ENCOUNTER — Other Ambulatory Visit: Payer: Self-pay

## 2020-06-30 ENCOUNTER — Encounter (HOSPITAL_BASED_OUTPATIENT_CLINIC_OR_DEPARTMENT_OTHER)
Admission: RE | Disposition: A | Discharge status: Home / Self Care | Payer: Self-pay | Source: Home / Self Care | Attending: Orthopaedic Surgery

## 2020-06-30 ENCOUNTER — Ambulatory Visit (HOSPITAL_BASED_OUTPATIENT_CLINIC_OR_DEPARTMENT_OTHER): Payer: 59 | Admitting: Certified Registered"

## 2020-06-30 ENCOUNTER — Encounter: Payer: Self-pay | Admitting: Orthopaedic Surgery

## 2020-06-30 ENCOUNTER — Telehealth: Payer: Self-pay | Admitting: Orthopaedic Surgery

## 2020-06-30 ENCOUNTER — Encounter (HOSPITAL_BASED_OUTPATIENT_CLINIC_OR_DEPARTMENT_OTHER): Payer: Self-pay | Admitting: Orthopaedic Surgery

## 2020-06-30 DIAGNOSIS — S83282A Other tear of lateral meniscus, current injury, left knee, initial encounter: Secondary | ICD-10-CM | POA: Diagnosis present

## 2020-06-30 DIAGNOSIS — S83212A Bucket-handle tear of medial meniscus, current injury, left knee, initial encounter: Secondary | ICD-10-CM

## 2020-06-30 DIAGNOSIS — S83272A Complex tear of lateral meniscus, current injury, left knee, initial encounter: Secondary | ICD-10-CM | POA: Insufficient documentation

## 2020-06-30 DIAGNOSIS — X58XXXA Exposure to other specified factors, initial encounter: Secondary | ICD-10-CM | POA: Diagnosis not present

## 2020-06-30 DIAGNOSIS — S83512A Sprain of anterior cruciate ligament of left knee, initial encounter: Secondary | ICD-10-CM | POA: Diagnosis present

## 2020-06-30 DIAGNOSIS — G8918 Other acute postprocedural pain: Secondary | ICD-10-CM | POA: Diagnosis not present

## 2020-06-30 DIAGNOSIS — Y939 Activity, unspecified: Secondary | ICD-10-CM | POA: Diagnosis not present

## 2020-06-30 DIAGNOSIS — S83252A Bucket-handle tear of lateral meniscus, current injury, left knee, initial encounter: Secondary | ICD-10-CM | POA: Diagnosis not present

## 2020-06-30 DIAGNOSIS — Z791 Long term (current) use of non-steroidal anti-inflammatories (NSAID): Secondary | ICD-10-CM | POA: Insufficient documentation

## 2020-06-30 DIAGNOSIS — Z79899 Other long term (current) drug therapy: Secondary | ICD-10-CM | POA: Insufficient documentation

## 2020-06-30 DIAGNOSIS — S83242A Other tear of medial meniscus, current injury, left knee, initial encounter: Secondary | ICD-10-CM | POA: Diagnosis present

## 2020-06-30 HISTORY — DX: Sprain of anterior cruciate ligament of unspecified knee, initial encounter: S83.519A

## 2020-06-30 HISTORY — PX: KNEE ARTHROSCOPY WITH ANTERIOR CRUCIATE LIGAMENT (ACL) REPAIR WITH HAMSTRING GRAFT: SHX5645

## 2020-06-30 LAB — POCT PREGNANCY, URINE: Preg Test, Ur: NEGATIVE

## 2020-06-30 SURGERY — KNEE ARTHROSCOPY WITH ANTERIOR CRUCIATE LIGAMENT (ACL) REPAIR WITH HAMSTRING GRAFT
Anesthesia: General | Laterality: Left

## 2020-06-30 MED ORDER — FENTANYL CITRATE (PF) 250 MCG/5ML IJ SOLN
INTRAMUSCULAR | Status: DC | PRN
Start: 2020-06-30 — End: 2020-06-30
  Administered 2020-06-30 (×4): 25 ug via INTRAVENOUS

## 2020-06-30 MED ORDER — ACETAMINOPHEN 500 MG PO TABS
ORAL_TABLET | ORAL | Status: AC
  Filled 2020-06-30: qty 2

## 2020-06-30 MED ORDER — FENTANYL CITRATE (PF) 100 MCG/2ML IJ SOLN
INTRAMUSCULAR | Status: AC
  Filled 2020-06-30: qty 2

## 2020-06-30 MED ORDER — DEXAMETHASONE SODIUM PHOSPHATE 10 MG/ML IJ SOLN
INTRAMUSCULAR | Status: DC | PRN
  Administered 2020-06-30: 5 mg via INTRAVENOUS

## 2020-06-30 MED ORDER — HYDROMORPHONE HCL 1 MG/ML IJ SOLN
0.2500 mg | INTRAMUSCULAR | Status: DC | PRN
  Administered 2020-06-30 (×2): 0.5 mg via INTRAVENOUS

## 2020-06-30 MED ORDER — SODIUM CHLORIDE 0.9 % IR SOLN
Status: DC | PRN
  Administered 2020-06-30: 20000 mL

## 2020-06-30 MED ORDER — FENTANYL CITRATE (PF) 100 MCG/2ML IJ SOLN
100.0000 ug | Freq: Once | INTRAMUSCULAR | Status: AC
  Administered 2020-06-30: 100 ug via INTRAVENOUS

## 2020-06-30 MED ORDER — CEFAZOLIN SODIUM-DEXTROSE 2-4 GM/100ML-% IV SOLN
2.0000 g | INTRAVENOUS | Status: AC
  Administered 2020-06-30: 2 g via INTRAVENOUS

## 2020-06-30 MED ORDER — METHOCARBAMOL 750 MG PO TABS: 750.0000 mg | ORAL_TABLET | Freq: Two times a day (BID) | ORAL | 3 refills | Status: DC | PRN

## 2020-06-30 MED ORDER — SCOPOLAMINE 1 MG/3DAYS TD PT72
MEDICATED_PATCH | TRANSDERMAL | Status: AC
  Filled 2020-06-30: qty 1

## 2020-06-30 MED ORDER — MEPERIDINE HCL 25 MG/ML IJ SOLN: 6.2500 mg | INTRAMUSCULAR | Status: DC | PRN

## 2020-06-30 MED ORDER — OXYCODONE-ACETAMINOPHEN 5-325 MG PO TABS: 1.0000 | ORAL_TABLET | Freq: Three times a day (TID) | ORAL | 0 refills | Status: DC | PRN

## 2020-06-30 MED ORDER — LACTATED RINGERS IV SOLN: INTRAVENOUS | Status: DC

## 2020-06-30 MED ORDER — OXYCODONE HCL ER 10 MG PO T12A: 10.0000 mg | EXTENDED_RELEASE_TABLET | Freq: Two times a day (BID) | ORAL | 0 refills | Status: AC

## 2020-06-30 MED ORDER — MIDAZOLAM HCL 2 MG/2ML IJ SOLN
INTRAMUSCULAR | Status: AC
  Filled 2020-06-30: qty 2

## 2020-06-30 MED ORDER — ROPIVACAINE HCL 7.5 MG/ML IJ SOLN
INTRAMUSCULAR | Status: DC | PRN
  Administered 2020-06-30: 20 mL via PERINEURAL

## 2020-06-30 MED ORDER — HYDROMORPHONE HCL 1 MG/ML IJ SOLN
INTRAMUSCULAR | Status: AC
  Filled 2020-06-30: qty 0.5

## 2020-06-30 MED ORDER — MIDAZOLAM HCL 5 MG/5ML IJ SOLN
INTRAMUSCULAR | Status: DC | PRN
  Administered 2020-06-30: 2 mg via INTRAVENOUS

## 2020-06-30 MED ORDER — MIDAZOLAM HCL 2 MG/2ML IJ SOLN: 0.5000 mg | Freq: Once | INTRAMUSCULAR | Status: DC | PRN

## 2020-06-30 MED ORDER — KETOROLAC TROMETHAMINE 30 MG/ML IJ SOLN
30.0000 mg | Freq: Once | INTRAMUSCULAR | Status: AC
  Administered 2020-06-30: 30 mg via INTRAVENOUS

## 2020-06-30 MED ORDER — KETOROLAC TROMETHAMINE 10 MG PO TABS: 10.0000 mg | ORAL_TABLET | Freq: Two times a day (BID) | ORAL | 0 refills | Status: DC | PRN

## 2020-06-30 MED ORDER — MIDAZOLAM HCL 2 MG/2ML IJ SOLN
2.0000 mg | Freq: Once | INTRAMUSCULAR | Status: AC
  Administered 2020-06-30: 2 mg via INTRAVENOUS

## 2020-06-30 MED ORDER — ONDANSETRON HCL 4 MG/2ML IJ SOLN
INTRAMUSCULAR | Status: DC | PRN
  Administered 2020-06-30: 4 mg via INTRAVENOUS

## 2020-06-30 MED ORDER — SCOPOLAMINE 1 MG/3DAYS TD PT72
1.0000 | MEDICATED_PATCH | TRANSDERMAL | Status: DC
  Administered 2020-06-30: 1.5 mg via TRANSDERMAL

## 2020-06-30 MED ORDER — ACETAMINOPHEN 500 MG PO TABS
1000.0000 mg | ORAL_TABLET | Freq: Once | ORAL | Status: AC
  Administered 2020-06-30: 1000 mg via ORAL

## 2020-06-30 MED ORDER — PROMETHAZINE HCL 25 MG/ML IJ SOLN: 6.2500 mg | INTRAMUSCULAR | Status: DC | PRN

## 2020-06-30 MED ORDER — OXYCODONE HCL 5 MG/5ML PO SOLN: 5.0000 mg | Freq: Once | ORAL | Status: AC | PRN

## 2020-06-30 MED ORDER — OXYCODONE HCL 5 MG PO TABS
5.0000 mg | ORAL_TABLET | Freq: Once | ORAL | Status: AC | PRN
  Administered 2020-06-30: 5 mg via ORAL

## 2020-06-30 MED ORDER — CEFAZOLIN SODIUM-DEXTROSE 2-4 GM/100ML-% IV SOLN
INTRAVENOUS | Status: AC
  Filled 2020-06-30: qty 100

## 2020-06-30 MED ORDER — PROMETHAZINE HCL 25 MG PO TABS: 25.0000 mg | ORAL_TABLET | Freq: Four times a day (QID) | ORAL | 1 refills | Status: DC | PRN

## 2020-06-30 MED ORDER — LIDOCAINE 2% (20 MG/ML) 5 ML SYRINGE
INTRAMUSCULAR | Status: DC | PRN
  Administered 2020-06-30: 40 mg via INTRAVENOUS

## 2020-06-30 MED ORDER — PROPOFOL 10 MG/ML IV BOLUS
INTRAVENOUS | Status: DC | PRN
  Administered 2020-06-30: 170 mg via INTRAVENOUS

## 2020-06-30 MED ORDER — KETOROLAC TROMETHAMINE 30 MG/ML IJ SOLN
INTRAMUSCULAR | Status: AC
  Filled 2020-06-30: qty 1

## 2020-06-30 MED ORDER — OXYCODONE HCL 5 MG PO TABS
ORAL_TABLET | ORAL | Status: AC
  Filled 2020-06-30: qty 1

## 2020-06-30 SURGICAL SUPPLY — 81 items
ANCHOR BUTTON TIGHTROPE W/FC3 (Orthopedic Implant) ×1 IMPLANT
BANDAGE ESMARK 6X9 LF (GAUZE/BANDAGES/DRESSINGS) ×1 IMPLANT
BLADE EXCALIBUR 4.0X13 (MISCELLANEOUS) ×1 IMPLANT
BLADE SURG 15 STRL LF DISP TIS (BLADE) ×1 IMPLANT
BLADE SURG 15 STRL SS (BLADE) ×1
BNDG ELASTIC 6X10 VLCR STRL LF (GAUZE/BANDAGES/DRESSINGS) ×1 IMPLANT
BNDG ELASTIC 6X5.8 VLCR STR LF (GAUZE/BANDAGES/DRESSINGS) ×1 IMPLANT
BNDG ESMARK 6X9 LF (GAUZE/BANDAGES/DRESSINGS) ×1
BURR OVAL 8 FLU 4.0X13 (MISCELLANEOUS) ×1 IMPLANT
COOLER ICEMAN CLASSIC (MISCELLANEOUS) ×1 IMPLANT
COVER BACK TABLE 60X90IN (DRAPES) ×1 IMPLANT
CUFF TOURN SGL QUICK 34 (TOURNIQUET CUFF) ×1
CUFF TRNQT CYL 34X4.125X (TOURNIQUET CUFF) ×1 IMPLANT
CUTTER TENSIONER SUT 2-0 0 FBW (INSTRUMENTS) ×1 IMPLANT
DRAPE ARTHROSCOPY W/POUCH 90 (DRAPES) ×1 IMPLANT
DRAPE IMP U-DRAPE 54X76 (DRAPES) ×2 IMPLANT
DRAPE OEC MINIVIEW 54X84 (DRAPES) ×1 IMPLANT
DRAPE U-SHAPE 47X51 STRL (DRAPES) ×1 IMPLANT
DRILL FLIPCUTTER III 6-12 (ORTHOPEDIC DISPOSABLE SUPPLIES) ×1 IMPLANT
DRSG EMULSION OIL 3X3 NADH (GAUZE/BANDAGES/DRESSINGS) ×1 IMPLANT
DRSG PAD ABDOMINAL 8X10 ST (GAUZE/BANDAGES/DRESSINGS) ×1 IMPLANT
DURAPREP 26ML APPLICATOR (WOUND CARE) ×1 IMPLANT
ELECT REM PT RETURN 9FT ADLT (ELECTROSURGICAL) ×1
ELECTRODE REM PT RTRN 9FT ADLT (ELECTROSURGICAL) ×1 IMPLANT
FLIPCUTTER III 6-12 AR-1204FF (ORTHOPEDIC DISPOSABLE SUPPLIES) ×1
GAUZE SPONGE 4X4 12PLY STRL (GAUZE/BANDAGES/DRESSINGS) ×1 IMPLANT
GLOVE BIOGEL PI IND STRL 7.0 (GLOVE) ×4 IMPLANT
GLOVE ECLIPSE 7.0 STRL STRAW (GLOVE) ×2 IMPLANT
GLOVE SKINSENSE STRL SZ7.5 (GLOVE) ×2 IMPLANT
GLOVE SURG SS PI 7.0 STRL IVOR (GLOVE) ×1 IMPLANT
GLOVE SURG SYN 7.5  E (GLOVE) ×2
GLOVE SURG SYN 7.5 E (GLOVE) ×2 IMPLANT
GOWN STRL REIN XL XLG (GOWN DISPOSABLE) ×1 IMPLANT
GOWN STRL REUS W/ TWL LRG LVL3 (GOWN DISPOSABLE) ×3 IMPLANT
GOWN STRL REUS W/ TWL XL LVL3 (GOWN DISPOSABLE) ×1 IMPLANT
GOWN STRL REUS W/TWL LRG LVL3 (GOWN DISPOSABLE) ×3
GOWN STRL REUS W/TWL XL LVL3 (GOWN DISPOSABLE) ×1
HARVESTER TENDON QUADPRO 9 (ORTHOPEDIC DISPOSABLE SUPPLIES) ×1 IMPLANT
IMMOBILIZER KNEE 22 UNIV (SOFTGOODS) ×1 IMPLANT
IMP SYS 2ND FIX PEEK 4.75X19.1 (Miscellaneous) ×1 IMPLANT
IMPL FIBERSTICH 2-0 CVD (Anchor) ×5 IMPLANT
IMPL FIBERSTITCH 2-0 CVD 24DEG (Anchor) ×2 IMPLANT
IMPL SYS 2ND FX PEEK 4.75X19.1 (Miscellaneous) ×1 IMPLANT
IMPLANT FIBERSTICH 2-0 CVD (Anchor) ×5 IMPLANT
KIT TRANSTIBIAL (DISPOSABLE) ×1 IMPLANT
KNEE WRAP E Z 3 GEL PACK (MISCELLANEOUS) ×1 IMPLANT
MANIFOLD NEPTUNE II (INSTRUMENTS) ×1 IMPLANT
NS IRRIG 1000ML POUR BTL (IV SOLUTION) ×1 IMPLANT
PACK ARTHROSCOPY DSU (CUSTOM PROCEDURE TRAY) ×1 IMPLANT
PACK BASIN DAY SURGERY FS (CUSTOM PROCEDURE TRAY) ×1 IMPLANT
PAD CAST 4YDX4 CTTN HI CHSV (CAST SUPPLIES) ×1 IMPLANT
PAD COLD SHLDR WRAP-ON (PAD) ×1 IMPLANT
PADDING CAST COTTON 4X4 STRL (CAST SUPPLIES) ×1
PADDING CAST COTTON 6X4 STRL (CAST SUPPLIES) ×1 IMPLANT
PENCIL SMOKE EVACUATOR (MISCELLANEOUS) ×1 IMPLANT
PORT APPOLLO RF 90DEGREE MULTI (SURGICAL WAND) ×1 IMPLANT
PORTAL SKID DEVICE (INSTRUMENTS) ×1 IMPLANT
SCREW INTERFERENCE FT BC 9X20 (Screw) ×1 IMPLANT
SHEET MEDIUM DRAPE 40X70 STRL (DRAPES) ×1 IMPLANT
SPONGE LAP 18X18 RF (DISPOSABLE) ×1 IMPLANT
STRIP CLOSURE SKIN 1/2X4 (GAUZE/BANDAGES/DRESSINGS) ×1 IMPLANT
SUCTION FRAZIER HANDLE 10FR (MISCELLANEOUS) ×1
SUCTION TUBE FRAZIER 10FR DISP (MISCELLANEOUS) ×1 IMPLANT
SUT ETHILON 3 0 FSL (SUTURE) ×1 IMPLANT
SUT ETHILON 3 0 PS 1 (SUTURE) ×1 IMPLANT
SUT FIBERWIRE #2 38 T-5 BLUE (SUTURE) ×1
SUT MNCRL AB 3-0 PS2 27 (SUTURE) ×1 IMPLANT
SUT VIC AB 1 CTX 27 (SUTURE) ×1 IMPLANT
SUT VIC AB 2-0 CT1 27 (SUTURE) ×4
SUT VIC AB 2-0 CT1 TAPERPNT 27 (SUTURE) ×4 IMPLANT
SUT VIC AB 2-0 SH 27 (SUTURE) ×1
SUT VIC AB 2-0 SH 27XBRD (SUTURE) ×1 IMPLANT
SUT VIC AB 3-0 SH 27 (SUTURE) ×1
SUT VIC AB 3-0 SH 27X BRD (SUTURE) ×1 IMPLANT
SUTURE FIBERWR #2 38 T-5 BLUE (SUTURE) ×1 IMPLANT
SUTURE TAPE 1.3 FIBERLOP 20 ST (SUTURE) ×1 IMPLANT
SUTURETAPE 1.3 FIBERLOOP 20 ST (SUTURE) ×1
TOWEL GREEN STERILE FF (TOWEL DISPOSABLE) ×2 IMPLANT
TUBE CONNECTING 20X1/4 (TUBING) ×1 IMPLANT
TUBING ARTHROSCOPY IRRIG 16FT (MISCELLANEOUS) ×1 IMPLANT
WATER STERILE IRR 1000ML POUR (IV SOLUTION) ×1 IMPLANT

## 2020-07-03 ENCOUNTER — Telehealth: Payer: Self-pay | Admitting: Orthopaedic Surgery

## 2020-07-03 ENCOUNTER — Encounter (HOSPITAL_BASED_OUTPATIENT_CLINIC_OR_DEPARTMENT_OTHER): Payer: Self-pay | Admitting: Orthopaedic Surgery

## 2020-07-03 MED ORDER — IBUPROFEN 800 MG PO TABS: 800.0000 mg | ORAL_TABLET | Freq: Three times a day (TID) | ORAL | 2 refills | Status: DC | PRN

## 2020-07-03 NOTE — Telephone Encounter (Signed)
Patient called requesting a refill of ketorolac. Please send to pharmacy on file. Patient phone number is [phone].

## 2020-07-04 ENCOUNTER — Other Ambulatory Visit: Payer: Self-pay | Admitting: Orthopaedic Surgery

## 2020-07-04 ENCOUNTER — Telehealth: Payer: Self-pay | Admitting: Orthopaedic Surgery

## 2020-07-04 NOTE — Telephone Encounter (Signed)
Patient called asked if she can get another antiinflammatory Rx because it helps with swelling and help get the fluid out of her knee. Patient said her knee is so tight from the fluid that's in her knee. Patient asked if Dr Erlinda Hong would please call her so that she can explain it better to him. The number to contact patient is [phone]

## 2020-07-05 ENCOUNTER — Telehealth: Payer: Self-pay | Admitting: Orthopaedic Surgery

## 2020-07-05 NOTE — Telephone Encounter (Signed)
[employer] forms received. Sent to [employer]. 

## 2020-07-07 ENCOUNTER — Other Ambulatory Visit: Payer: Self-pay

## 2020-07-07 ENCOUNTER — Encounter: Payer: Self-pay | Admitting: Orthopaedic Surgery

## 2020-07-07 ENCOUNTER — Ambulatory Visit (INDEPENDENT_AMBULATORY_CARE_PROVIDER_SITE_OTHER): Payer: 59 | Admitting: Physician Assistant

## 2020-07-07 VITALS — Ht 67.0 in | Wt 165.0 lb

## 2020-07-07 DIAGNOSIS — S83512A Sprain of anterior cruciate ligament of left knee, initial encounter: Secondary | ICD-10-CM

## 2020-07-07 MED ORDER — HYDROCODONE-ACETAMINOPHEN 5-325 MG PO TABS
1.0000 | ORAL_TABLET | Freq: Three times a day (TID) | ORAL | 0 refills | Status: DC | PRN
Start: 2020-07-07 — End: 2020-07-21

## 2020-07-10 ENCOUNTER — Other Ambulatory Visit: Payer: Self-pay

## 2020-07-10 ENCOUNTER — Ambulatory Visit: Payer: 59 | Admitting: Physical Therapy

## 2020-07-10 ENCOUNTER — Encounter: Payer: Self-pay | Admitting: Physical Therapy

## 2020-07-10 DIAGNOSIS — R2681 Unsteadiness on feet: Secondary | ICD-10-CM

## 2020-07-10 DIAGNOSIS — R2689 Other abnormalities of gait and mobility: Secondary | ICD-10-CM

## 2020-07-10 DIAGNOSIS — M25562 Pain in left knee: Secondary | ICD-10-CM | POA: Diagnosis not present

## 2020-07-10 DIAGNOSIS — M6281 Muscle weakness (generalized): Secondary | ICD-10-CM | POA: Diagnosis not present

## 2020-07-10 DIAGNOSIS — M25662 Stiffness of left knee, not elsewhere classified: Secondary | ICD-10-CM

## 2020-07-10 DIAGNOSIS — R600 Localized edema: Secondary | ICD-10-CM

## 2020-07-17 ENCOUNTER — Other Ambulatory Visit: Payer: Self-pay

## 2020-07-17 ENCOUNTER — Encounter: Payer: Self-pay | Admitting: Rehabilitative and Restorative Service Providers"

## 2020-07-17 ENCOUNTER — Ambulatory Visit: Payer: 59 | Admitting: Rehabilitative and Restorative Service Providers"

## 2020-07-17 DIAGNOSIS — R2681 Unsteadiness on feet: Secondary | ICD-10-CM | POA: Diagnosis not present

## 2020-07-17 DIAGNOSIS — R2689 Other abnormalities of gait and mobility: Secondary | ICD-10-CM

## 2020-07-17 DIAGNOSIS — M25662 Stiffness of left knee, not elsewhere classified: Secondary | ICD-10-CM | POA: Diagnosis not present

## 2020-07-17 DIAGNOSIS — M6281 Muscle weakness (generalized): Secondary | ICD-10-CM

## 2020-07-17 DIAGNOSIS — R600 Localized edema: Secondary | ICD-10-CM

## 2020-07-17 DIAGNOSIS — M25562 Pain in left knee: Secondary | ICD-10-CM | POA: Diagnosis not present

## 2020-07-19 ENCOUNTER — Encounter: Payer: Self-pay | Admitting: Rehabilitative and Restorative Service Providers"

## 2020-07-19 ENCOUNTER — Telehealth: Payer: Self-pay | Admitting: Orthopaedic Surgery

## 2020-07-19 ENCOUNTER — Ambulatory Visit: Payer: 59 | Admitting: Rehabilitative and Restorative Service Providers"

## 2020-07-19 ENCOUNTER — Other Ambulatory Visit: Payer: Self-pay

## 2020-07-19 DIAGNOSIS — R600 Localized edema: Secondary | ICD-10-CM

## 2020-07-19 DIAGNOSIS — R2681 Unsteadiness on feet: Secondary | ICD-10-CM

## 2020-07-19 DIAGNOSIS — R2689 Other abnormalities of gait and mobility: Secondary | ICD-10-CM

## 2020-07-19 DIAGNOSIS — M25562 Pain in left knee: Secondary | ICD-10-CM | POA: Diagnosis not present

## 2020-07-19 DIAGNOSIS — M6281 Muscle weakness (generalized): Secondary | ICD-10-CM | POA: Diagnosis not present

## 2020-07-19 DIAGNOSIS — M25662 Stiffness of left knee, not elsewhere classified: Secondary | ICD-10-CM

## 2020-07-20 ENCOUNTER — Telehealth: Payer: Self-pay | Admitting: Orthopaedic Surgery

## 2020-07-20 NOTE — Telephone Encounter (Signed)
Patient came in office to pay form fee. She asked for a note to say she is waiting to [employer] to complete her forms. She states she has been proactive in forms since prior to surgery and [employer] does not return her calls. With this, the [employer] forms was completed and patient given copy. Form faxed to [employer]. I called patient informed her payment for the form she can pick up at front desk.

## 2020-07-20 NOTE — Telephone Encounter (Signed)
Pt called stating she has been trying to process her FMLA and her job hasn't received anything; whenever the pt calls [employer] she gets an answering machine and no one returns her calls. Pt would like a CB to update her on the status of her paperwork please and ASAP as her job is threatening to fire her.   [phone]

## 2020-07-21 ENCOUNTER — Other Ambulatory Visit: Payer: Self-pay | Admitting: Physician Assistant

## 2020-07-21 MED ORDER — HYDROCODONE-ACETAMINOPHEN 5-325 MG PO TABS: 1.0000 | ORAL_TABLET | Freq: Every day | ORAL | 0 refills | Status: DC | PRN

## 2020-07-24 ENCOUNTER — Ambulatory Visit: Payer: 59 | Admitting: Physical Therapy

## 2020-07-24 ENCOUNTER — Encounter: Payer: Self-pay | Admitting: Physical Therapy

## 2020-07-24 ENCOUNTER — Other Ambulatory Visit: Payer: Self-pay

## 2020-07-24 DIAGNOSIS — R2689 Other abnormalities of gait and mobility: Secondary | ICD-10-CM | POA: Diagnosis not present

## 2020-07-24 DIAGNOSIS — M25662 Stiffness of left knee, not elsewhere classified: Secondary | ICD-10-CM

## 2020-07-24 DIAGNOSIS — R2681 Unsteadiness on feet: Secondary | ICD-10-CM

## 2020-07-24 DIAGNOSIS — M25562 Pain in left knee: Secondary | ICD-10-CM | POA: Diagnosis not present

## 2020-07-24 DIAGNOSIS — M6281 Muscle weakness (generalized): Secondary | ICD-10-CM | POA: Diagnosis not present

## 2020-07-24 DIAGNOSIS — R600 Localized edema: Secondary | ICD-10-CM | POA: Diagnosis not present

## 2020-07-31 ENCOUNTER — Encounter: Payer: 59 | Admitting: Physical Therapy

## 2020-08-02 ENCOUNTER — Encounter: Payer: 59 | Admitting: Physical Therapy

## 2020-08-03 ENCOUNTER — Ambulatory Visit (INDEPENDENT_AMBULATORY_CARE_PROVIDER_SITE_OTHER): Payer: 59 | Admitting: Orthopaedic Surgery

## 2020-08-03 ENCOUNTER — Ambulatory Visit (INDEPENDENT_AMBULATORY_CARE_PROVIDER_SITE_OTHER): Payer: 59

## 2020-08-03 ENCOUNTER — Other Ambulatory Visit: Payer: Self-pay | Admitting: Physician Assistant

## 2020-08-03 ENCOUNTER — Other Ambulatory Visit: Payer: Self-pay

## 2020-08-03 ENCOUNTER — Encounter: Payer: Self-pay | Admitting: Orthopaedic Surgery

## 2020-08-03 VITALS — Ht 67.0 in | Wt 165.0 lb

## 2020-08-03 DIAGNOSIS — S83512A Sprain of anterior cruciate ligament of left knee, initial encounter: Secondary | ICD-10-CM | POA: Diagnosis not present

## 2020-08-03 DIAGNOSIS — S83242A Other tear of medial meniscus, current injury, left knee, initial encounter: Secondary | ICD-10-CM

## 2020-08-03 DIAGNOSIS — M25562 Pain in left knee: Secondary | ICD-10-CM

## 2020-08-03 MED ORDER — HYDROCODONE-ACETAMINOPHEN 5-325 MG PO TABS
1.0000 | ORAL_TABLET | Freq: Two times a day (BID) | ORAL | 0 refills | Status: DC | PRN
Start: 2020-08-03 — End: 2020-08-03

## 2020-08-03 MED ORDER — LIDOCAINE HCL 1 % IJ SOLN
2.0000 mL | INTRAMUSCULAR | Status: AC | PRN
  Administered 2020-08-03: 2 mL

## 2020-08-03 MED ORDER — BUPIVACAINE HCL 0.5 % IJ SOLN
2.0000 mL | INTRAMUSCULAR | Status: AC | PRN
  Administered 2020-08-03: 2 mL via INTRA_ARTICULAR

## 2020-08-03 MED ORDER — METHYLPREDNISOLONE ACETATE 40 MG/ML IJ SUSP
40.0000 mg | INTRAMUSCULAR | Status: AC | PRN
  Administered 2020-08-03: 40 mg via INTRA_ARTICULAR

## 2020-08-03 MED ORDER — HYDROCODONE-ACETAMINOPHEN 5-325 MG PO TABS: 1.0000 | ORAL_TABLET | Freq: Two times a day (BID) | ORAL | 0 refills | Status: DC | PRN

## 2020-08-07 ENCOUNTER — Encounter: Payer: 59 | Admitting: Physical Therapy

## 2020-08-09 ENCOUNTER — Encounter: Payer: 59 | Admitting: Physical Therapy

## 2020-08-14 ENCOUNTER — Encounter: Payer: Self-pay | Admitting: Physical Therapy

## 2020-08-14 ENCOUNTER — Ambulatory Visit: Payer: 59 | Admitting: Physical Therapy

## 2020-08-14 ENCOUNTER — Other Ambulatory Visit: Payer: Self-pay

## 2020-08-14 DIAGNOSIS — M25562 Pain in left knee: Secondary | ICD-10-CM | POA: Diagnosis not present

## 2020-08-14 DIAGNOSIS — M25662 Stiffness of left knee, not elsewhere classified: Secondary | ICD-10-CM | POA: Diagnosis not present

## 2020-08-14 DIAGNOSIS — M6281 Muscle weakness (generalized): Secondary | ICD-10-CM | POA: Diagnosis not present

## 2020-08-14 DIAGNOSIS — R2681 Unsteadiness on feet: Secondary | ICD-10-CM | POA: Diagnosis not present

## 2020-08-14 DIAGNOSIS — R2689 Other abnormalities of gait and mobility: Secondary | ICD-10-CM | POA: Diagnosis not present

## 2020-08-14 DIAGNOSIS — R600 Localized edema: Secondary | ICD-10-CM

## 2020-08-16 ENCOUNTER — Other Ambulatory Visit: Payer: Self-pay

## 2020-08-16 ENCOUNTER — Ambulatory Visit: Payer: 59 | Admitting: Physical Therapy

## 2020-08-16 ENCOUNTER — Encounter: Payer: Self-pay | Admitting: Physical Therapy

## 2020-08-16 DIAGNOSIS — M6281 Muscle weakness (generalized): Secondary | ICD-10-CM | POA: Diagnosis not present

## 2020-08-16 DIAGNOSIS — R2689 Other abnormalities of gait and mobility: Secondary | ICD-10-CM

## 2020-08-16 DIAGNOSIS — M25662 Stiffness of left knee, not elsewhere classified: Secondary | ICD-10-CM

## 2020-08-16 DIAGNOSIS — R600 Localized edema: Secondary | ICD-10-CM | POA: Diagnosis not present

## 2020-08-16 DIAGNOSIS — R2681 Unsteadiness on feet: Secondary | ICD-10-CM | POA: Diagnosis not present

## 2020-08-16 DIAGNOSIS — M25562 Pain in left knee: Secondary | ICD-10-CM | POA: Diagnosis not present

## 2020-08-30 ENCOUNTER — Telehealth: Payer: Self-pay | Admitting: Orthopaedic Surgery

## 2020-08-30 NOTE — Telephone Encounter (Signed)
Patient called needing Rx refilled (Hydrocodone) The number to contact patient is [phone]

## 2020-08-31 ENCOUNTER — Encounter: Payer: Self-pay | Admitting: Orthopaedic Surgery

## 2020-08-31 ENCOUNTER — Ambulatory Visit (INDEPENDENT_AMBULATORY_CARE_PROVIDER_SITE_OTHER): Payer: 59 | Admitting: Orthopaedic Surgery

## 2020-08-31 VITALS — Ht 67.0 in | Wt 165.0 lb

## 2020-08-31 DIAGNOSIS — S83512A Sprain of anterior cruciate ligament of left knee, initial encounter: Secondary | ICD-10-CM

## 2020-08-31 DIAGNOSIS — G8929 Other chronic pain: Secondary | ICD-10-CM

## 2020-08-31 DIAGNOSIS — M25562 Pain in left knee: Secondary | ICD-10-CM

## 2020-08-31 MED ORDER — HYDROCODONE-ACETAMINOPHEN 5-325 MG PO TABS
1.0000 | ORAL_TABLET | Freq: Every day | ORAL | 0 refills | Status: DC | PRN
Start: 2020-08-31 — End: 2021-04-17

## 2020-09-01 ENCOUNTER — Other Ambulatory Visit (HOSPITAL_COMMUNITY): Payer: Self-pay | Admitting: Internal Medicine

## 2020-09-02 ENCOUNTER — Ambulatory Visit
Admission: EM | Admit: 2020-09-02 | Discharge: 2020-09-02 | Disposition: A | Discharge status: Home / Self Care | Payer: 59 | Attending: Urgent Care | Admitting: Urgent Care

## 2020-09-02 ENCOUNTER — Other Ambulatory Visit: Payer: Self-pay

## 2020-09-02 DIAGNOSIS — Z1152 Encounter for screening for COVID-19: Secondary | ICD-10-CM

## 2020-09-02 DIAGNOSIS — R059 Cough, unspecified: Secondary | ICD-10-CM

## 2020-09-02 DIAGNOSIS — R519 Headache, unspecified: Secondary | ICD-10-CM

## 2020-09-02 DIAGNOSIS — J019 Acute sinusitis, unspecified: Secondary | ICD-10-CM | POA: Diagnosis not present

## 2020-09-02 MED ORDER — AMOXICILLIN 875 MG PO TABS: 875.0000 mg | ORAL_TABLET | Freq: Two times a day (BID) | ORAL | 0 refills | Status: DC

## 2020-09-02 MED ORDER — CETIRIZINE HCL 10 MG PO TABS: 10.0000 mg | ORAL_TABLET | Freq: Every day | ORAL | 0 refills | Status: DC

## 2020-09-02 MED ORDER — PROMETHAZINE-DM 6.25-15 MG/5ML PO SYRP: 5.0000 mL | ORAL_SOLUTION | Freq: Every evening | ORAL | 0 refills | Status: DC | PRN

## 2020-09-02 MED ORDER — BENZONATATE 100 MG PO CAPS: 100.0000 mg | ORAL_CAPSULE | Freq: Three times a day (TID) | ORAL | 0 refills | Status: DC | PRN

## 2020-09-03 ENCOUNTER — Ambulatory Visit (INDEPENDENT_AMBULATORY_CARE_PROVIDER_SITE_OTHER): Payer: 59

## 2020-09-03 DIAGNOSIS — G8929 Other chronic pain: Secondary | ICD-10-CM

## 2020-09-03 DIAGNOSIS — S83512A Sprain of anterior cruciate ligament of left knee, initial encounter: Secondary | ICD-10-CM

## 2020-09-03 DIAGNOSIS — M25462 Effusion, left knee: Secondary | ICD-10-CM | POA: Diagnosis not present

## 2020-09-04 LAB — NOVEL CORONAVIRUS, NAA: SARS-CoV-2, NAA: NOT DETECTED

## 2020-09-04 LAB — SARS-COV-2, NAA 2 DAY TAT

## 2020-09-07 ENCOUNTER — Other Ambulatory Visit: Payer: 59

## 2020-09-07 ENCOUNTER — Encounter: Payer: Self-pay | Admitting: Orthopaedic Surgery

## 2020-09-07 ENCOUNTER — Other Ambulatory Visit: Payer: Self-pay

## 2020-09-07 ENCOUNTER — Ambulatory Visit (INDEPENDENT_AMBULATORY_CARE_PROVIDER_SITE_OTHER): Payer: 59 | Admitting: Orthopaedic Surgery

## 2020-09-07 DIAGNOSIS — S83242A Other tear of medial meniscus, current injury, left knee, initial encounter: Secondary | ICD-10-CM

## 2020-09-07 DIAGNOSIS — S83512A Sprain of anterior cruciate ligament of left knee, initial encounter: Secondary | ICD-10-CM | POA: Diagnosis not present

## 2020-09-07 MED ORDER — HYDROCODONE-ACETAMINOPHEN 5-325 MG PO TABS
1.0000 | ORAL_TABLET | Freq: Every day | ORAL | 0 refills | Status: DC | PRN
Start: 2020-09-07 — End: 2021-04-17

## 2020-09-07 MED ORDER — BUPIVACAINE HCL 0.5 % IJ SOLN
2.0000 mL | INTRAMUSCULAR | Status: AC | PRN
  Administered 2020-09-07: 2 mL via INTRA_ARTICULAR

## 2020-09-07 MED ORDER — METHYLPREDNISOLONE ACETATE 40 MG/ML IJ SUSP
40.0000 mg | INTRAMUSCULAR | Status: AC | PRN
  Administered 2020-09-07: 40 mg via INTRA_ARTICULAR

## 2020-09-07 MED ORDER — MELOXICAM 7.5 MG PO TABS: 15.0000 mg | ORAL_TABLET | Freq: Every day | ORAL | 2 refills | Status: DC

## 2020-09-07 MED ORDER — LIDOCAINE HCL 1 % IJ SOLN
2.0000 mL | INTRAMUSCULAR | Status: AC | PRN
  Administered 2020-09-07: 2 mL

## 2020-09-19 DIAGNOSIS — L700 Acne vulgaris: Secondary | ICD-10-CM | POA: Diagnosis not present

## 2020-09-21 ENCOUNTER — Telehealth: Payer: Self-pay | Admitting: Orthopaedic Surgery

## 2020-09-21 NOTE — Telephone Encounter (Signed)
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

## 2020-09-25 ENCOUNTER — Telehealth: Payer: Self-pay | Admitting: Orthopaedic Surgery

## 2020-09-25 ENCOUNTER — Telehealth: Payer: Self-pay

## 2020-09-25 NOTE — Telephone Encounter (Signed)
Pt called and she needs a note to be out of work.She would like to come pick it up today! She said Autumn sent it on MyChart but she doesn't see it. Please let her know if you can do it today at [phone]

## 2020-09-26 ENCOUNTER — Other Ambulatory Visit: Payer: Self-pay | Admitting: Physician Assistant

## 2020-09-26 MED ORDER — ACETAMINOPHEN-CODEINE #3 300-30 MG PO TABS: 1.0000 | ORAL_TABLET | Freq: Three times a day (TID) | ORAL | 0 refills | Status: DC | PRN

## 2020-10-17 ENCOUNTER — Other Ambulatory Visit: Payer: Self-pay | Admitting: Orthopaedic Surgery

## 2020-11-01 ENCOUNTER — Ambulatory Visit (INDEPENDENT_AMBULATORY_CARE_PROVIDER_SITE_OTHER): Payer: 59 | Admitting: Orthopaedic Surgery

## 2020-11-01 DIAGNOSIS — S83242D Other tear of medial meniscus, current injury, left knee, subsequent encounter: Secondary | ICD-10-CM

## 2020-11-01 DIAGNOSIS — S83512D Sprain of anterior cruciate ligament of left knee, subsequent encounter: Secondary | ICD-10-CM | POA: Diagnosis not present

## 2020-11-19 ENCOUNTER — Ambulatory Visit (HOSPITAL_COMMUNITY)
Admission: EM | Admit: 2020-11-19 | Discharge: 2020-11-19 | Disposition: A | Discharge status: Home / Self Care | Payer: No Payment, Other | Attending: Psychiatry | Admitting: Psychiatry

## 2020-11-19 ENCOUNTER — Other Ambulatory Visit: Payer: Self-pay

## 2020-11-19 DIAGNOSIS — F4321 Adjustment disorder with depressed mood: Secondary | ICD-10-CM

## 2020-11-19 DIAGNOSIS — F331 Major depressive disorder, recurrent, moderate: Secondary | ICD-10-CM | POA: Insufficient documentation

## 2020-11-19 DIAGNOSIS — F419 Anxiety disorder, unspecified: Secondary | ICD-10-CM | POA: Insufficient documentation
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
## 2021-02-09 DIAGNOSIS — Z118 Encounter for screening for other infectious and parasitic diseases: Secondary | ICD-10-CM | POA: Diagnosis not present

## 2021-02-09 DIAGNOSIS — Z113 Encounter for screening for infections with a predominantly sexual mode of transmission: Secondary | ICD-10-CM | POA: Diagnosis not present

## 2021-02-09 DIAGNOSIS — R309 Painful micturition, unspecified: Secondary | ICD-10-CM | POA: Diagnosis not present

## 2021-02-09 DIAGNOSIS — Z01411 Encounter for gynecological examination (general) (routine) with abnormal findings: Secondary | ICD-10-CM | POA: Diagnosis not present

## 2021-02-26 DIAGNOSIS — Z01419 Encounter for gynecological examination (general) (routine) without abnormal findings: Secondary | ICD-10-CM | POA: Diagnosis not present

## 2021-04-17 ENCOUNTER — Ambulatory Visit
Admission: EM | Admit: 2021-04-17 | Discharge: 2021-04-17 | Disposition: A | Discharge status: Home / Self Care | Payer: PRIVATE HEALTH INSURANCE | Attending: Emergency Medicine | Admitting: Emergency Medicine

## 2021-04-17 ENCOUNTER — Other Ambulatory Visit: Payer: Self-pay

## 2021-04-17 DIAGNOSIS — K0889 Other specified disorders of teeth and supporting structures: Secondary | ICD-10-CM | POA: Diagnosis not present

## 2021-04-17 MED ORDER — AMOXICILLIN-POT CLAVULANATE 875-125 MG PO TABS: 1.0000 | ORAL_TABLET | Freq: Two times a day (BID) | ORAL | 0 refills | Status: AC

## 2021-04-17 MED ORDER — IBUPROFEN 600 MG PO TABS: 600.0000 mg | ORAL_TABLET | Freq: Four times a day (QID) | ORAL | 0 refills | Status: DC | PRN

## 2021-04-17 MED ORDER — HYDROCODONE-ACETAMINOPHEN 5-325 MG PO TABS: 1.0000 | ORAL_TABLET | Freq: Four times a day (QID) | ORAL | 0 refills | Status: DC | PRN

## 2021-07-10 ENCOUNTER — Other Ambulatory Visit: Payer: Self-pay

## 2021-07-10 ENCOUNTER — Encounter: Payer: Self-pay | Admitting: Orthopaedic Surgery

## 2021-07-10 ENCOUNTER — Ambulatory Visit (INDEPENDENT_AMBULATORY_CARE_PROVIDER_SITE_OTHER): Payer: No Typology Code available for payment source | Admitting: Orthopaedic Surgery

## 2021-07-10 VITALS — Ht 67.0 in | Wt 165.0 lb

## 2021-07-10 DIAGNOSIS — Z9889 Other specified postprocedural states: Secondary | ICD-10-CM

## 2022-09-18 ENCOUNTER — Encounter: Payer: Self-pay | Admitting: Emergency Medicine

## 2022-09-18 ENCOUNTER — Ambulatory Visit (INDEPENDENT_AMBULATORY_CARE_PROVIDER_SITE_OTHER): Payer: 59 | Admitting: *Deleted

## 2022-09-18 ENCOUNTER — Encounter (HOSPITAL_COMMUNITY): Payer: Self-pay

## 2022-09-18 VITALS — BP 124/71 | HR 93 | Wt 198.5 lb

## 2022-09-18 DIAGNOSIS — Z3201 Encounter for pregnancy test, result positive: Secondary | ICD-10-CM | POA: Diagnosis not present

## 2022-09-18 LAB — POCT URINE PREGNANCY: Preg Test, Ur: POSITIVE — AB

## 2022-09-18 NOTE — Progress Notes (Signed)
[name] presents today for UPT. She has no unusual complaints. LMP:   08/15/22 OBJECTIVE: Appears well, in no apparent distress.  OB History     Gravida  1   Para  0   Term  0   Preterm  0   AB  1   Living         SAB  1   IAB  0   Ectopic  0   Multiple      Live Births             Home UPT Result: positive In-Office UPT result: positive I have reviewed the patient's medical, obstetrical, social, and family histories, and medications.   ASSESSMENT: Positive pregnancy test  PLAN Prenatal care to be completed at: [place] Pregnancy information folder given. Ectopic/ SAB precautions reviewed. US/Intake appt scheduled at check out.

## 2022-09-30 NOTE — L&D Delivery Note (Signed)
Obstetrical Delivery Note   Date of Delivery:   05/17/2023 Primary OB:   [facility] Gestational Age/EDD: [redacted]w[redacted]d Reason for Admission: Active labor Antepartum complications: Anemia. Declines blood products  Delivered By:   [place] Bing, Montez Hageman MD  Delivery Type:   spontaneous vaginal delivery  Delivery Details:   Called for delivery and baby was +3 with forebag present which I ruptured for clear fluid. Over next contractions, patient pushed well but tight band at introitus felt to be holding baby back, FHR in the 80s. Patient verbally consented and she was amenable to episiotomy, which was done and then baby delivered with next push. 2nd degree with 2-0 vicryl imbricating layer and then 2-0 vicryl for subcutaneous stitch. Rectal negative.  Anesthesia:    epidural Intrapartum complications: None GBS:    Negative Laceration:    2nd degree Episiotomy:    Right mediolateral without extension Rectal exam:   Negative post repair Placenta:    Delivered and expressed via active management. Intact: yes. To pathology: no.  Delayed Cord Clamping: yes Estimated Blood Loss:   Baby:    Liveborn female, APGARs {0-10:33138}/{0-10:33138}, weight ***gm  Cornelia Copa. MD Attending Center for [facility])

## 2022-10-16 ENCOUNTER — Other Ambulatory Visit (HOSPITAL_COMMUNITY)
Admission: RE | Admit: 2022-10-16 | Discharge: 2022-10-16 | Disposition: A | Discharge status: Home / Self Care | Payer: 59 | Source: Ambulatory Visit | Attending: Obstetrics and Gynecology | Admitting: Obstetrics and Gynecology

## 2022-10-16 ENCOUNTER — Ambulatory Visit (INDEPENDENT_AMBULATORY_CARE_PROVIDER_SITE_OTHER): Payer: 59

## 2022-10-16 VITALS — BP 122/69 | HR 80 | Ht 67.5 in | Wt 197.1 lb

## 2022-10-16 DIAGNOSIS — Z3481 Encounter for supervision of other normal pregnancy, first trimester: Secondary | ICD-10-CM | POA: Diagnosis not present

## 2022-10-16 DIAGNOSIS — Z3480 Encounter for supervision of other normal pregnancy, unspecified trimester: Secondary | ICD-10-CM | POA: Diagnosis not present

## 2022-10-16 DIAGNOSIS — O3680X0 Pregnancy with inconclusive fetal viability, not applicable or unspecified: Secondary | ICD-10-CM

## 2022-10-16 DIAGNOSIS — Z3A08 8 weeks gestation of pregnancy: Secondary | ICD-10-CM

## 2022-10-16 NOTE — Progress Notes (Signed)
New OB Intake  I connected with Kelly [name] on 10/16/22 at  3:10 PM EST by in person and verified that I am speaking with the correct person using two identifiers. Nurse is located at [facility] and pt is located at [place].  I discussed the limitations, risks, security and privacy concerns of performing an evaluation and management service by telephone and the availability of in person appointments. I also discussed with the patient that there may be a patient responsible charge related to this service. The patient expressed understanding and agreed to proceed.  I explained I am completing New OB Intake today. We discussed EDD of 05/22/23 that is based on LMP of 08/15/22. Pt is G2/P0010. I reviewed her allergies, medications, Medical/Surgical/OB history, and appropriate screenings. I informed her of [facility] services. [facility] information placed in AVS. Based on history, this is a low risk pregnancy.  Patient Active Problem List   Diagnosis Date Noted   Left anterior cruciate ligament tear 06/20/2020   Acute medial meniscus tear of left knee 06/20/2020   ASCUS with positive high risk HPV 05/13/2014   Adjustment disorder with mixed anxiety and depressed mood 04/12/2014    Concerns addressed today  Delivery Plans Plans to deliver at [facility]. Patient given information for [facility] Healthy Baby website for more information about [facility] and [place]. Patient is interested in water birth. Offered upcoming OB visit with CNM to discuss further.  MyChart/Babyscripts MyChart access verified. I explained pt will have some visits in office and some virtually. Babyscripts instructions given and order placed. Patient verifies receipt of registration text/e-mail. Account successfully created and app downloaded.  Blood Pressure Cuff/Weight Scale Patient has access to BP cuff at home. Explained after first prenatal appt pt will check weekly and document in 56. Patient does have weight  scale.  Anatomy US Explained first scheduled [place] will be around 19 weeks. Dating and viability us/ performed today. Anatomy TBD.  Labs Discussed Johnsie Cancel genetic screening with patient. Would like both Panorama and Horizon drawn at new OB visit. Routine prenatal labs needed.  COVID Vaccine Patient has had COVID vaccine.   Is patient a CenteringPregnancy candidate?  Declined Declined due to Group setting Not a candidate due to  If accepted,   Social Determinants of Health Food Insecurity: Patient denies food insecurity. WIC Referral: Patient is interested in referral to [facility].  Transportation: Patient denies transportation needs. Childcare: Discussed no children allowed at ultrasound appointments. Offered childcare services; patient declines childcare services at this time.  First visit review I reviewed new OB appt with patient. I explained they will have a provider visit that includes pap smear, genetic screening, and discuss plan of care for pregnancy. Explained pt will be seen by Baltazar Najjar at first visit; encounter routed to appropriate provider. Explained that patient will be seen by pregnancy navigator following visit with provider.   Lucianne Lei, RN 10/16/2022  3:23 PM

## 2022-10-17 LAB — CBC/D/PLT+RPR+RH+ABO+RUBIGG...
Antibody Screen: NEGATIVE
Basophils Absolute: 0 10*3/uL (ref 0.0–0.2)
Basos: 0 %
EOS (ABSOLUTE): 0 10*3/uL (ref 0.0–0.4)
Eos: 0 %
HCV Ab: NONREACTIVE
HIV Screen 4th Generation wRfx: NONREACTIVE
Hematocrit: 35.5 % (ref 34.0–46.6)
Hemoglobin: 12.1 g/dL (ref 11.1–15.9)
Hepatitis B Surface Ag: NEGATIVE
Immature Grans (Abs): 0 10*3/uL (ref 0.0–0.1)
Immature Granulocytes: 0 %
Lymphocytes Absolute: 1.8 10*3/uL (ref 0.7–3.1)
Lymphs: 22 %
MCH: 28.3 pg (ref 26.6–33.0)
MCHC: 34.1 g/dL (ref 31.5–35.7)
MCV: 83 fL (ref 79–97)
Monocytes Absolute: 0.5 10*3/uL (ref 0.1–0.9)
Monocytes: 6 %
Neutrophils Absolute: 5.9 10*3/uL (ref 1.4–7.0)
Neutrophils: 72 %
Platelets: 294 10*3/uL (ref 150–450)
RBC: 4.28 x10E6/uL (ref 3.77–5.28)
RDW: 13 % (ref 11.7–15.4)
RPR Ser Ql: NONREACTIVE
Rh Factor: POSITIVE
Rubella Antibodies, IGG: 3.07 index (ref >0.99)
WBC: 8.3 10*3/uL (ref 3.4–10.8)

## 2022-10-17 LAB — HCV INTERPRETATION

## 2022-10-18 ENCOUNTER — Ambulatory Visit
Admission: EM | Admit: 2022-10-18 | Discharge: 2022-10-18 | Disposition: A | Discharge status: Home / Self Care | Payer: 59 | Attending: Physician Assistant | Admitting: Physician Assistant

## 2022-10-18 DIAGNOSIS — O26899 Other specified pregnancy related conditions, unspecified trimester: Secondary | ICD-10-CM | POA: Diagnosis present

## 2022-10-18 DIAGNOSIS — Z3A00 Weeks of gestation of pregnancy not specified: Secondary | ICD-10-CM

## 2022-10-18 DIAGNOSIS — R300 Dysuria: Secondary | ICD-10-CM

## 2022-10-18 LAB — CERVICOVAGINAL ANCILLARY ONLY
Chlamydia: NEGATIVE
Comment: NEGATIVE
Comment: NEGATIVE
Comment: NORMAL
Neisseria Gonorrhea: NEGATIVE
Trichomonas: NEGATIVE

## 2022-10-18 LAB — POCT URINALYSIS DIP (MANUAL ENTRY)
Bilirubin, UA: NEGATIVE
Blood, UA: NEGATIVE
Glucose, UA: NEGATIVE mg/dL
Ketones, POC UA: NEGATIVE mg/dL
Leukocytes, UA: NEGATIVE
Nitrite, UA: NEGATIVE
Protein Ur, POC: NEGATIVE mg/dL
Spec Grav, UA: 1.01 (ref 1.010–1.025)
Urobilinogen, UA: 0.2 E.U./dL
pH, UA: 6 (ref 5.0–8.0)

## 2022-10-18 LAB — CULTURE, OB URINE

## 2022-10-18 LAB — POCT URINE PREGNANCY: Preg Test, Ur: POSITIVE — AB

## 2022-10-18 LAB — URINE CULTURE, OB REFLEX: Organism ID, Bacteria: NO GROWTH

## 2022-10-18 NOTE — ED Triage Notes (Signed)
Patient presents to UC for frequency, cloudy, and dysuria x 1 week. Ob-gyn instructed her to be evaluated for UTI.

## 2022-10-18 NOTE — ED Provider Notes (Signed)
[facility]    CSN: [id number] Arrival date & time: 10/18/22  1712      History   Chief Complaint Chief Complaint  Patient presents with   Flank Pain   Dysuria    HPI Kelly [name] is a 32 y.o. female.    Flank Pain Pertinent negatives include no abdominal pain.  Dysuria Associated symptoms: flank pain   Associated symptoms: no abdominal pain, no fever, no nausea and no vomiting     Past Medical History:  Diagnosis Date   ACL tear     Patient Active Problem List   Diagnosis Date Noted   Supervision of other normal pregnancy, antepartum 10/16/2022   Left anterior cruciate ligament tear 06/20/2020   Acute medial meniscus tear of left knee 06/20/2020   ASCUS with positive high risk HPV 05/13/2014   Adjustment disorder with mixed anxiety and depressed mood 04/12/2014    Past Surgical History:  Procedure Laterality Date   KNEE ARTHROSCOPY WITH ANTERIOR CRUCIATE LIGAMENT (ACL) REPAIR WITH HAMSTRING GRAFT Left 06/30/2020   Procedure: LEFT KNEE ARTHROSCOPY WITH ANTERIOR CRUCIATE LIGAMENT (ACL) RECONSTRUCTION, PARTIAL MEDIAL MENISCUS REPAIR;  Surgeon: Leandrew Koyanagi, MD;  Location: [place];  Service: Orthopedics;  Laterality: Left;   NO PAST SURGERIES      OB History     Gravida  2   Para  0   Term  0   Preterm  0   AB  1   Living         SAB  1   IAB  0   Ectopic  0   Multiple      Live Births               Home Medications    Prior to Admission medications   Medication Sig Start Date End Date Taking? Authorizing Provider  amoxicillin-clavulanate (AUGMENTIN) 875-125 MG tablet Take 1 tablet by mouth every 12 (twelve) hours. 05/30/16   Tomi Likens, PA-C  fluticasone (FLONASE) 50 MCG/ACT nasal spray Place 2 sprays into both nostrils daily. 05/30/16   Tomi Likens, PA-C  Prenatal Vit-Fe Fumarate-FA (MULTIVITAMIN-PRENATAL) 27-0.8 MG TABS tablet Take 1 tablet by mouth daily at 12 noon.    [provider]  promethazine-dextromethorphan (PROMETHAZINE-DM) 6.25-15 MG/5ML syrup Take 5 mLs by mouth 4 (four) times daily as needed for cough. 05/30/16   Tomi Likens, PA-C  promethazine (PHENERGAN) 25 MG tablet Take 1 tablet (25 mg total) by mouth every 6 (six) hours as needed for nausea. 06/30/20 09/02/20  Leandrew Koyanagi, MD    Family History Family History  Problem Relation Age of Onset   Hypertension Mother    Diabetes Father    Hypertension Brother    Breast cancer Maternal Grandmother 51   Pancreatic cancer Maternal Grandmother     Social History Social History   Tobacco Use   Smoking status: Never   Smokeless tobacco: Never  Vaping Use   Vaping Use: Never used  Substance Use Topics   Alcohol use: Not Currently    Comment: occas, prior to pregnancy   Drug use: Never     Allergies   Patient has no known allergies.   Review of Systems Review of Systems  Constitutional:  Negative for chills and fever.  Eyes:  Negative for discharge and redness.  Gastrointestinal:  Negative for abdominal pain, nausea and vomiting.  Genitourinary:  Positive for dysuria and flank pain.     Physical Exam Triage  Vital Signs ED Triage Vitals  Enc Vitals Group     BP 10/18/22 1755 116/74     Pulse Rate 10/18/22 1755 79     Resp 10/18/22 1755 16     Temp 10/18/22 1755 98 F (36.7 C)     Temp src --      SpO2 10/18/22 1755 98 %     Weight --      Height --      Head Circumference --      Peak Flow --      Pain Score 10/18/22 1754 0     Pain Loc --      Pain Edu? --      Excl. in [place]? --    No data found.  Updated Vital Signs BP 116/74 (BP Location: Left Arm)   Pulse 79   Temp 98 F (36.7 C)   Resp 16   LMP 08/15/2022 (Exact Date)   SpO2 98%      Physical Exam Vitals and nursing note reviewed.  Constitutional:      General: She is not in acute distress.    Appearance: Normal appearance. She is not ill-appearing.  HENT:     Head: Normocephalic and  atraumatic.  Eyes:     Conjunctiva/sclera: Conjunctivae normal.  Cardiovascular:     Rate and Rhythm: Normal rate.  Pulmonary:     Effort: Pulmonary effort is normal.  Neurological:     Mental Status: She is alert.  Psychiatric:        Mood and Affect: Mood normal.        Behavior: Behavior normal.        Thought Content: Thought content normal.      UC Treatments / Results  Labs (all labs ordered are listed, but only abnormal results are displayed) Labs Reviewed  POCT URINE PREGNANCY - Abnormal; Notable for the following components:      Result Value   Preg Test, Ur Positive (*)    All other components within normal limits  URINE CULTURE  POCT URINALYSIS DIP (MANUAL ENTRY)    EKG   Radiology No results found.  Procedures Procedures (including critical care time)  Medications Ordered in UC Medications - No data to display  Initial Impression / Assessment and Plan / UC Course  I have reviewed the triage vital signs and the nursing notes.  Pertinent labs & imaging results that were available during my care of the patient were reviewed by me and considered in my medical decision making (see chart for details).    Reassured patient that UA was clear- will order urine culture given pregnancy. Encouraged increased water intake and cranberry  juice to help increase urine acidity as well in case this helps improve symptoms. Recommended further evaluation with any further concerns or questions.    Patient had confirmed pregnancy prior to today's visit-unknown to employee who placed Upreg order- there will be no charge for Upreg.   Final Clinical Impressions(s) / UC Diagnoses   Final diagnoses:  Dysuria during pregnancy, antepartum   Discharge Instructions   None    ED Prescriptions   None    PDMP not reviewed this encounter.   Francene Finders, PA-C 10/18/22 [phone]

## 2022-10-20 LAB — URINE CULTURE: Culture: <10000 — AB

## 2022-11-01 ENCOUNTER — Encounter (HOSPITAL_COMMUNITY): Payer: Self-pay | Admitting: Obstetrics and Gynecology

## 2022-11-01 ENCOUNTER — Inpatient Hospital Stay (HOSPITAL_COMMUNITY)
Admission: AD | Admit: 2022-11-01 | Discharge: 2022-11-01 | Disposition: A | Discharge status: Home / Self Care | Payer: 59 | Attending: Obstetrics and Gynecology | Admitting: Obstetrics and Gynecology

## 2022-11-01 DIAGNOSIS — K529 Noninfective gastroenteritis and colitis, unspecified: Secondary | ICD-10-CM | POA: Diagnosis not present

## 2022-11-01 DIAGNOSIS — O218 Other vomiting complicating pregnancy: Secondary | ICD-10-CM | POA: Insufficient documentation

## 2022-11-01 DIAGNOSIS — Z3A11 11 weeks gestation of pregnancy: Secondary | ICD-10-CM

## 2022-11-01 LAB — CBC
HCT: 37.8 % (ref 36.0–46.0)
Hemoglobin: 12.3 g/dL (ref 12.0–15.0)
MCH: 27.1 pg (ref 26.0–34.0)
MCHC: 32.5 g/dL (ref 30.0–36.0)
MCV: 83.3 fL (ref 80.0–100.0)
Platelets: 312 10*3/uL (ref 150–400)
RBC: 4.54 MIL/uL (ref 3.87–5.11)
RDW: 13.2 % (ref 11.5–15.5)
WBC: 8.9 10*3/uL (ref 4.0–10.5)
nRBC: 0 % (ref 0.0–0.2)

## 2022-11-01 LAB — URINALYSIS, ROUTINE W REFLEX MICROSCOPIC
Bilirubin Urine: NEGATIVE
Glucose, UA: NEGATIVE mg/dL
Hgb urine dipstick: NEGATIVE
Ketones, ur: 80 mg/dL — AB
Leukocytes,Ua: NEGATIVE
Nitrite: NEGATIVE
Protein, ur: 30 mg/dL — AB
Specific Gravity, Urine: 1.034 — ABNORMAL HIGH (ref 1.005–1.030)
pH: 5 (ref 5.0–8.0)

## 2022-11-01 LAB — COMPREHENSIVE METABOLIC PANEL
ALT: 26 U/L (ref 0–44)
AST: 31 U/L (ref 15–41)
Albumin: 3.9 g/dL (ref 3.5–5.0)
Alkaline Phosphatase: 35 U/L — ABNORMAL LOW (ref 38–126)
Anion gap: 12 (ref 5–15)
BUN: 8 mg/dL (ref 6–20)
CO2: 20 mmol/L — ABNORMAL LOW (ref 22–32)
Calcium: 9.1 mg/dL (ref 8.9–10.3)
Chloride: 103 mmol/L (ref 98–111)
Creatinine, Ser: 0.72 mg/dL (ref 0.44–1.00)
GFR, Estimated: >60 mL/min (ref >60)
Glucose, Bld: 107 mg/dL — ABNORMAL HIGH (ref 70–99)
Potassium: 3.4 mmol/L — ABNORMAL LOW (ref 3.5–5.1)
Sodium: 135 mmol/L (ref 135–145)
Total Bilirubin: 0.8 mg/dL (ref 0.3–1.2)
Total Protein: 7.3 g/dL (ref 6.5–8.1)

## 2022-11-01 MED ORDER — LACTATED RINGERS IV SOLN: Freq: Once | INTRAVENOUS | Status: AC

## 2022-11-01 MED ORDER — SODIUM CHLORIDE 0.9 % IV SOLN
12.5000 mg | Freq: Once | INTRAVENOUS | Status: AC
  Administered 2022-11-01: 12.5 mg via INTRAVENOUS
  Filled 2022-11-01: qty 0.5

## 2022-11-01 MED ORDER — ONDANSETRON 4 MG PO TBDP: 4.0000 mg | ORAL_TABLET | Freq: Four times a day (QID) | ORAL | 1 refills | Status: DC | PRN

## 2022-11-01 NOTE — MAU Provider Note (Addendum)
Chief Complaint: Emesis During Pregnancy and Diarrhea   Event Date/Time   First Provider Initiated Contact with Patient 11/01/22 [phone]        SUBJECTIVE HPI: Kelly [name] is a 32 y.o. G2P0010 at 62w1dby LMP who presents to maternity admissions reporting nausea, vomiting and diarrhea since 3pm yesterday.  It started soon after eating a sandwich.  Works in [employer]  No fever. She denies vaginal bleeding, vaginal itching/burning, urinary symptoms, h/a, dizziness, or fever/chills.    Diarrhea  This is a new problem. The current episode started yesterday. The problem occurs 5 to 10 times per day. Associated symptoms include abdominal pain (cramping from vomiting) and vomiting. Pertinent negatives include no bloating, chills or fever. She has tried nothing for the symptoms.  Emesis  This is a new problem. The current episode started yesterday. The problem occurs more than 10 times per day. There has been no fever. Associated symptoms include abdominal pain (cramping from vomiting) and diarrhea. Pertinent negatives include no chills or fever. She has tried nothing for the symptoms.   RN Note: Kelly Handleris a 32y.o. at 138w1dere in MAU reporting n/v/d since 1500 yesterday after eating a tuKu[place]wich. Vomited about 10x and diarrhea about 6times. Denies any pain currently and no pregnancy concerns   Onset of complaint: yesterday at 1500 Pain score: 0  Past Medical History:  Diagnosis Date   ACL tear    Past Surgical History:  Procedure Laterality Date   KNEE ARTHROSCOPY WITH ANTERIOR CRUCIATE LIGAMENT (ACL) REPAIR WITH HAMSTRING GRAFT Left 06/30/2020   Procedure: LEFT KNEE ARTHROSCOPY WITH ANTERIOR CRUCIATE LIGAMENT (ACL) RECONSTRUCTION, PARTIAL MEDIAL MENISCUS REPAIR;  Surgeon: XuLeandrew KoyanagiMD;  [place] [facility] [place];  Laterality: Left;   Social History   Socioeconomic History   Marital status: Married    Spouse name: Not on  file   Number of children: Not on file   Years of education: Not on file   Highest education level: Not on file  Occupational History   Not on file  Tobacco Use   Smoking status: Never   Smokeless tobacco: Never  Vaping Use   Vaping Use: Never used  Substance and Sexual Activity   Alcohol use: Not Currently    Comment: occas, prior to pregnancy   Drug use: Never   Sexual activity: Yes    Partners: Male    Birth control/protection: None  Other Topics Concern   Not on file  Social History Narrative   ** Merged History Encounter **       Social Determinants of Health   Financial Resource Strain: Not on file  Food Insecurity: Not on file  Transportation Needs: Not on file  Physical Activity: Not on file  Stress: Not on file  Social Connections: Not on file  Intimate Partner Violence: Not on file   No current facility-administered medications on file prior to encounter.   Current Outpatient Medications on File Prior to Encounter  Medication Sig Dispense Refill   Prenatal Vit-Fe Fumarate-FA (MULTIVITAMIN-PRENATAL) 27-0.8 MG TABS tablet Take 1 tablet by mouth daily at 12 noon.     [DISCONTINUED] promethazine (PHENERGAN) 25 MG tablet Take 1 tablet (25 mg total) by mouth every 6 (six) hours as needed for nausea. 30 tablet 1   No Known Allergies  I have reviewed patient's Past Medical Hx, Surgical Hx, Family Hx, Social Hx, medications and allergies.   ROS:  Review of Systems  Constitutional:  Negative for chills and fever.  Gastrointestinal:  Positive for abdominal pain (cramping from vomiting), diarrhea and vomiting. Negative for bloating.   Review of Systems  Other systems negative   Physical Exam  Physical Exam Patient Vitals for the past 24 hrs:  BP Temp Temp src Pulse Resp SpO2 Height Weight  11/01/22 0718 112/82 99.3 F (37.4 C) Oral (!) 103 18 100 % -- --  11/01/22 0655 118/76 99.5 F (37.5 C) -- 98 17 100 % '5\' 7"'$  (1.702 m) 85.7 kg   Constitutional:  Well-developed, well-nourished female in no acute distress.  Cardiovascular: normal rate Respiratory: normal effort GI: Abd soft, non-tender. Pos BS x 4 MS: Extremities nontender, no edema, normal ROM Neurologic: Alert and oriented x 4.  GU: Neg CVAT.  LAB RESULTS   A/Positive/-- (01/17 1650)  IMAGING   MAU Management/MDM: I have reviewed the triage vital signs and the nursing notes.   Pertinent labs & imaging results that were available during my care of the patient were reviewed by me and considered in my medical decision making (see chart for details).      I have reviewed her medical records including past results, notes and treatments. Medical, Surgical, and family history were reviewed.  Medications and recent lab tests were reviewed  Ordered usual first trimester r/o ectopic labs.   Pelvic exam and cultures done Will check baseline Ultrasound to rule out ectopic.  Treatments in MAU included IV hydration, Phenergan.  WIll add meds as needed.    ASSESSMENT Single IUP at 33w1dNausea, vomiting, diarrhea Probable gastroenteritis Dehydration  PLAN Care turned over to oncoming provider  MHansel FeinsteinCNM, MSN Certified Nurse-Midwife 11/01/2022  7:11 AM     Patient reports improvement in symptoms after IV meds & fluids. Will d/c home with zofran   A/P: 1. Gastroenteritis presumed infectious   2. [redacted] weeks gestation of pregnancy    -Rx zofran -advance diet as tolerated, BLAND diet -Reviewed reasons to return to MAU  LJorje Guild NP

## 2022-11-01 NOTE — MAU Note (Signed)
.  Kelly [name] is a 32 y.o. at 55w1dhere in MAU reporting n/v/d since 1500 yesterday after eating a tKuwaitsandwich. Vomited about 10x and diarrhea about 6times. Denies any pain currently and no pregnancy concerns  Onset of complaint: yesterday at 1500 Pain score: 0 Vitals:   11/01/22 0655  BP: 118/76  Pulse: 98  Resp: 17  Temp: 99.5 F (37.5 C)  SpO2: 100%     FHT:n/a Lab orders placed from triage: u/a

## 2022-11-13 ENCOUNTER — Encounter: Payer: Self-pay | Admitting: Obstetrics

## 2022-11-13 ENCOUNTER — Other Ambulatory Visit (HOSPITAL_COMMUNITY)
Admission: RE | Admit: 2022-11-13 | Discharge: 2022-11-13 | Disposition: A | Discharge status: Home / Self Care | Payer: 59 | Source: Ambulatory Visit | Attending: Obstetrics | Admitting: Obstetrics

## 2022-11-13 ENCOUNTER — Ambulatory Visit (INDEPENDENT_AMBULATORY_CARE_PROVIDER_SITE_OTHER): Payer: 59 | Admitting: Obstetrics

## 2022-11-13 VITALS — BP 117/75 | HR 92 | Wt 187.1 lb

## 2022-11-13 DIAGNOSIS — Z3A12 12 weeks gestation of pregnancy: Secondary | ICD-10-CM

## 2022-11-13 DIAGNOSIS — Z3481 Encounter for supervision of other normal pregnancy, first trimester: Secondary | ICD-10-CM

## 2022-11-13 DIAGNOSIS — O219 Vomiting of pregnancy, unspecified: Secondary | ICD-10-CM

## 2022-11-13 DIAGNOSIS — Z3482 Encounter for supervision of other normal pregnancy, second trimester: Secondary | ICD-10-CM | POA: Insufficient documentation

## 2022-11-13 MED ORDER — DOXYLAMINE-PYRIDOXINE 10-10 MG PO TBEC: DELAYED_RELEASE_TABLET | ORAL | 5 refills | Status: DC

## 2022-11-13 NOTE — Progress Notes (Signed)
Subjective:    Kelly [name] is being seen today for her first obstetrical visit.  This is a planned pregnancy. She is at 53w6dgestation. Her obstetrical history is significant for  n/a . Relationship with FOB: spouse, living together. Patient does intend to breast feed. Pregnancy history fully reviewed.  The information documented in the HPI was reviewed and verified.  Menstrual History: OB History     Gravida  2   Para  0   Term  0   Preterm  0   AB  1   Living         SAB  1   IAB  0   Ectopic  0   Multiple      Live Births               Patient's last menstrual period was 08/15/2022 (exact date).    Past Medical History:  Diagnosis Date   ACL tear     Past Surgical History:  Procedure Laterality Date   KNEE ARTHROSCOPY WITH ANTERIOR CRUCIATE LIGAMENT (ACL) REPAIR WITH HAMSTRING GRAFT Left 06/30/2020   Procedure: LEFT KNEE ARTHROSCOPY WITH ANTERIOR CRUCIATE LIGAMENT (ACL) RECONSTRUCTION, PARTIAL MEDIAL MENISCUS REPAIR;  Surgeon: XLeandrew Koyanagi MD;  [place]  Service: Orthopedics;  Laterality: Left;    (Not in a hospital admission)  No Known Allergies  Social History   Tobacco Use   Smoking status: Never   Smokeless tobacco: Never  Substance Use Topics   Alcohol use: Not Currently    Comment: occas, prior to pregnancy    Family History  Problem Relation Age of Onset   Hypertension Mother    Diabetes Father    Hypertension Brother    Breast cancer Maternal Grandmother 41  Pancreatic cancer Maternal Grandmother      Review of Systems Constitutional: negative for weight loss Gastrointestinal: positive for nausea and vomiting Genitourinary:negative for genital lesions and vaginal discharge and dysuria Musculoskeletal:negative for back pain Behavioral/Psych: negative for abusive relationship, depression, illegal drug usage and tobacco use    Objective:    BP 117/75   Pulse 92   Wt 187 lb 1.6 oz (84.9 kg)    LMP 08/15/2022 (Exact Date)   BMI 29.30 kg/m  General Appearance:    Alert, cooperative, no distress, appears stated age  Head:    Normocephalic, without obvious abnormality, atraumatic  Eyes:    PERRL, conjunctiva/corneas clear, EOM's intact, fundi    benign, both eyes  Ears:    Normal TM's and external ear canals, both ears  Nose:   Nares normal, septum midline, mucosa normal, no drainage    or sinus tenderness  Throat:   Lips, mucosa, and tongue normal; teeth and gums normal  Neck:   Supple, symmetrical, trachea midline, no adenopathy;    thyroid:  no enlargement/tenderness/nodules; no carotid   bruit or JVD  Back:     Symmetric, no curvature, ROM normal, no CVA tenderness  Lungs:     Clear to auscultation bilaterally, respirations unlabored  Chest Wall:    No tenderness or deformity   Heart:    Regular rate and rhythm, S1 and S2 normal, no murmur, rub   or gallop  Breast Exam:    No tenderness, masses, or nipple abnormality  Abdomen:     Soft, non-tender, bowel sounds active all four quadrants,    no masses, no organomegaly  Genitalia:    Normal female without lesion, discharge or tenderness  Extremities:  Extremities normal, atraumatic, no cyanosis or edema  Pulses:   2+ and symmetric all extremities  Skin:   Skin color, texture, turgor normal, no rashes or lesions  Lymph nodes:   Cervical, supraclavicular, and axillary nodes normal  Neurologic:   CNII-XII intact, normal strength, sensation and reflexes    throughout      Lab Review Urine pregnancy test Labs reviewed yes Radiologic studies reviewed yes  Assessment:    Pregnancy at 107w6dweeks    Plan:   1. Encounter for supervision of other normal pregnancy in second trimester Rx - Panorama Prenatal Test Full Panel - HORIZON Custom - Cytology - PAP( [place])  2. Nausea and vomiting in pregnancy Rx: - Doxylamine-Pyridoxine (DICLEGIS) 10-10 MG TBEC; 1 tab in AM, 1 tab mid afternoon 2 tabs at bedtime. Max dose 4  tabs daily.  Dispense: 100 tablet; Refill: 5    Prenatal vitamins.  Counseling provided regarding continued use of seat belts, cessation of alcohol consumption, smoking or use of illicit drugs; infection precautions i.e., influenza/TDAP immunizations, toxoplasmosis,CMV, parvovirus, listeria and varicella; workplace safety, exercise during pregnancy; routine dental care, safe medications, sexual activity, hot tubs, saunas, pools, travel, caffeine use, fish and methlymercury, potential toxins, hair treatments, varicose veins Weight gain recommendations per IOM guidelines reviewed: underweight/BMI< 18.5--> gain 28 - 40 lbs; normal weight/BMI 18.5 - 24.9--> gain 25 - 35 lbs; overweight/BMI 25 - 29.9--> gain 15 - 25 lbs; obese/BMI >30->gain  11 - 20 lbs Problem list reviewed and updated. FIRST/CF mutation testing/NIPT/QUAD SCREEN/fragile X/Ashkenazi Jewish population testing/Spinal muscular atrophy discussed: requested. Role of ultrasound in pregnancy discussed; fetal survey: requested. Amniocentesis discussed: not indicated.  Meds ordered this encounter  Medications   Doxylamine-Pyridoxine (DICLEGIS) 10-10 MG TBEC    Sig: 1 tab in AM, 1 tab mid afternoon 2 tabs at bedtime. Max dose 4 tabs daily.    Dispense:  100 tablet    Refill:  5   Orders Placed This Encounter  Procedures   Panorama Prenatal Test Full Panel    ==========Department Information========== ID: [id number]:[facility] [address] [place]Dept: [phone] Fax: [phone]    Order Specific Question:   Expected due date (MM/DD/YYYY):    Answer:   05/22/2023    Order Specific Question:   Is this a twin pregnancy? (viable, no vanished twin)    Answer:   No    Order Specific Question:   Is this a surrogate or egg donor pregnancy?    Answer:   No    Order Specific Question:   I want fetal sex  included in the report:    Answer:   Yes    Order Specific Question:   Maternal Weight (lbs):    Answer:   181   Order Specific Question:   Which Microdeletion Panel should be ordered?    Answer:   22q11.2 Deletion    Order Specific Question:   What type of billing?    Answer:   BProgramme researcher, broadcasting/film/videoQuestion:   By placing this electronic order I confirm the testing ordered herein is medically necessary and this patient has been informed of the details of the genetic test(s) ordered, including the risks, benefits, and alternatives, and has consented to testing.    Answer:   Yes  Order Specific Question:   Select an order diagnosis: For additional options refer to [email or web address]    Answer:   Encounter for supervision of other normal pregnancy in second trimester [[id number]]   HORIZON Custom    ==========Department Information========== ID: [id number] Department:[facility] [place] [facility] [place] [place] [address] Dept: [phone] Dept Fax: [phone]     Order Specific Question:   Specify the name or ID of a valid Horizon Custom Panel:    Answer:   HBasic    Order Specific Question:   Is patient pregnant?    Answer:   Yes    Order Specific Question:   Ethnicity of patient:    Answer:   African American    Order Specific Question:   Practice ensures that HIPAA consent is obtained and will make available to [facility] upon request?    Answer:   Yes    Order Specific Question:   By placing this electronic order I confirm the testing ordered herein is medically necessary and this patient has been informed of the details of the genetic test(s) ordered, including the risks, benefits, and alternatives, and has consented to testing.    Answer:   Yes    Order Specific Question:   What type of billing?    Answer:   Programme researcher, broadcasting/film/video  Question:   Select an order diagnosis: For additional options refer to [email or web address]    Answer:   Encounter for supervision of other normal pregnancy, second trimester [id number]    Order Specific Question:   Tay-Sachs add-on test?    Answer:   No    Follow up in 4 weeks.  I have spent a total of 30 minutes of face-to-face time, excluding clinical staff time, reviewing notes and preparing to see patient, ordering tests and/or medications, and counseling the patient.   Shelly Bombard, MD 11/13/2022 4:24 PM

## 2022-11-13 NOTE — Progress Notes (Signed)
NOB in office, intake and u/s completed on 10/16/22

## 2022-11-18 LAB — CYTOLOGY - PAP
Comment: NEGATIVE
Diagnosis: NEGATIVE
High risk HPV: NEGATIVE

## 2022-11-20 LAB — PANORAMA PRENATAL TEST FULL PANEL:PANORAMA TEST PLUS 5 ADDITIONAL MICRODELETIONS: FETAL FRACTION: 4.8

## 2022-11-24 LAB — HORIZON CUSTOM: REPORT SUMMARY: POSITIVE — AB

## 2022-11-26 ENCOUNTER — Encounter: Payer: Self-pay | Admitting: Obstetrics and Gynecology

## 2022-11-26 ENCOUNTER — Other Ambulatory Visit: Payer: Self-pay

## 2022-11-26 DIAGNOSIS — D563 Thalassemia minor: Secondary | ICD-10-CM

## 2022-11-28 ENCOUNTER — Other Ambulatory Visit: Payer: Self-pay

## 2022-11-28 ENCOUNTER — Ambulatory Visit: Payer: Self-pay | Admitting: Obstetrics

## 2022-11-28 ENCOUNTER — Ambulatory Visit: Payer: 59 | Attending: Obstetrics

## 2022-11-28 DIAGNOSIS — Z8269 Family history of other diseases of the musculoskeletal system and connective tissue: Secondary | ICD-10-CM

## 2022-11-28 DIAGNOSIS — D563 Thalassemia minor: Secondary | ICD-10-CM | POA: Diagnosis not present

## 2022-11-28 NOTE — Progress Notes (Signed)
Virtual Visit via Video Note  I connected with Kelly [name] on 11/28/22 at 11:00 AM EST by a video enabled telemedicine application and verified that I am speaking with the correct person using two identifiers.  Location: Patient: home  Provider: [facility] [place]   I discussed the limitations of evaluation and management by telemedicine and the availability of in person appointments. The patient expressed understanding and agreed to proceed.  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  She was present on this virtual visit with her partner, Kelly [name].   Kelly [name] had Horizon carrier screening performed through [place]. 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 The screening also included testing for cystic fibrosis, spinal muscular atrophy and Beta-hemoglobin abnormalities including sickle cell disease and beta thalassemia.  The screening was negative for these conditions, which greatly reduces but cannot eliminate the chance that she is a carrier for these conditions. See that report for details and residual risk estimates.  Alpha thalassemia: Alpha-thalassemia is different in its inheritance compared to other hemoglobinopathies as there are two copies of two alpha globin genes (HBA1 and HBA2) on each chromosome 16, or four alpha globin genes total (aa/aa). A person can be a carrier of one alpha gene mutation (aa/a-), also referred to as a "silent carrier". A person who carries two alpha globin gene mutations can either carry them in cis (both on the same chromosome, denoted as aa/--) or in trans (on different chromosomes, denoted as a-/a-). Alpha-thalassemia carriers of two mutations who  have African American ancestry are more likely to have a trans arrangement (a-/a-); cis configuration is reported to be rare in individuals with African American ancestry.     There are several different forms of alpha-thalassemia. The most severe form of alpha-thalassemia, Hb Barts, is associated with an absence of alpha globin chain synthesis as a result of deletions of all four alpha globin genes (--/--).  Given that Kelly [name] is a silent carrier (aa/a-), her pregnancies would not be at increased risk for Hb Barts, even if her partner is a carrier for alpha-thalassemia, as she will always pass on at least one copy of the alpha globin gene to her children. Hemoglobin H (HbH) disease is caused by three deleted or dysfunctioning alpha globin alleles (a-/--) and is characterized by microcytic hypochromic hemolytic anemia, hepatosplenomegaly, mild jaundice, growth retardation, and sometimes thalassemia-like bone changes. Given Kelly [name]'s silent carrier status (aa/a-), the current fetus would only be at risk for HbH disease (a-/--), if her partner is a carrier for two alpha globin mutations in cis (aa/--). If this is the case, the risk for HbH disease in the pregnancy would be 1 in 4 (25%). However, if Kelly [name] partner is a carrier for two alpha globin mutations, he would be more likely to carry them in trans configuration (a-/a-) than the cis configuration (aa/--), given his ethnicity. If he is a carrier of alpha-thalassemia in trans, then the pregnancy would not be at increased risk for HbH disease and would at most be a carrier of two gene changes in trans (a-/a-).  Carriers may have mild anemia, or small red blood cells (low MCV on CBC), but are expected to be healthy. Based on the carrier frequency  for alpha-thalassemia in the African American population, [name] partner has a 1 in 30 chance of being any type of carrier for alpha-thalassemia. We also discussed that if both parents are known  to be carriers, then testing during pregnancy through amniocentesis or CVS would be made available.  Aneuploidy screening: The results of the Panorama cell free DNA testing were also reviewed.  These results showed a less than 1 in 10,000 risk for trisomies 21, 18 and 13, and monosomy X (Turner syndrome). In addition, the risk for triploidy and sex chromosome trisomies (47,XXX and 47,XXY) was also low. Kelly [name] elected to have cfDNA analysis for 22q11.2 deletion syndrome, which was also low risk (1 in 3,100). While this testing identifies 94-99% of pregnancies with trisomy 56, trisomy 63, and trisomy 30, and >70% of cases of sex chromosome aneuploidies, it is NOT diagnostic. A positive test result requires confirmation by CVS or amniocentesis, and a negative test result does not rule out a fetal chromosome abnormality. This testing does not identify all genetic conditions.  If screening for open neural tube defects is desired, we would recommend a maternal serum AFP only in the second trimester to be drawn through her OB.  Family/Pregnancy history: We also obtained a detailed family history and pregnancy history. The patient stated that this is her second pregnancy, the first with Kelly [name]. She had an early miscarriage several years ago.  Kelly [name] has a healthy 55 year old son from a prior relationship.  Kelly [name] denied any complications or exposure to medications, tobacco, alcohol or recreational drugs in this pregnancy. She reported that her brother was treated for Rickets as a child, and that her paternal grandfather may have also had Rickets. She then stated that her mother said Alice had Rickets when she was little because her teeth were "rotting out". Rickets is a condition that results in softening and weakness of the bones most often due to severe vitamin D deficiency.  There are also rare genetic causes for Rickets due to changes in the metabolism of phosphorous and vitamin D.  These  inherited forms may be dominant, recessive or X linked. We would need additional medical information about the cause for the Rickets in these family members in order to determine if this pregnancy is at increased risk for a similar condition. The remainder of the family history was unremarkable for birth defects, intellectual disabilities, recurrent pregnancy loss or known genetic conditions.  Plan of care: The couple desires carrier testing for Kelly [name] and requested that a saliva test kit be mailed to their home.  He also stated that results may be conveyed to Kelly [name] when they become available.  His contact information is as follows:  Kelly [name], dob 08/13/1987, phone [phone], email [email or web address].  He would like testing for alpha thal trait only.  We appreciate being involved in the care of this patient and can be reached at [phone] with any questions or concerns.   Kelly Finlay, MS, CGC  I provided 30 minutes of non-face-to-face time during this encounter.   Kelly [name]

## 2022-12-06 ENCOUNTER — Encounter: Payer: Self-pay | Admitting: Obstetrics

## 2022-12-06 ENCOUNTER — Ambulatory Visit (INDEPENDENT_AMBULATORY_CARE_PROVIDER_SITE_OTHER): Payer: 59 | Admitting: Obstetrics

## 2022-12-06 VITALS — BP 111/68 | HR 70 | Wt 187.0 lb

## 2022-12-06 DIAGNOSIS — O219 Vomiting of pregnancy, unspecified: Secondary | ICD-10-CM

## 2022-12-06 DIAGNOSIS — D563 Thalassemia minor: Secondary | ICD-10-CM

## 2022-12-06 DIAGNOSIS — Z3480 Encounter for supervision of other normal pregnancy, unspecified trimester: Secondary | ICD-10-CM

## 2022-12-06 DIAGNOSIS — Z3A16 16 weeks gestation of pregnancy: Secondary | ICD-10-CM

## 2022-12-06 NOTE — Progress Notes (Signed)
Subjective:  Natayla Tussing is a 32 y.o. G2P0010 at 29w1dbeing seen today for ongoing prenatal care.  She is currently monitored for the following issues for this low-risk pregnancy and has Adjustment disorder with mixed anxiety and depressed mood; ASCUS with positive high risk HPV; Left anterior cruciate ligament tear; Acute medial meniscus tear of left knee; and Supervision of other normal pregnancy, antepartum on their problem list.  Patient reports no complaints.  Contractions: Not present. Vag. Bleeding: None.   . Denies leaking of fluid.   The following portions of the patient's history were reviewed and updated as appropriate: allergies, current medications, past family history, past medical history, past social history, past surgical history and problem list. Problem list updated.  Objective:   Vitals:   12/06/22 0850  BP: 111/68  Pulse: 70  Weight: 187 lb (84.8 kg)    Fetal Status: Fetal Heart Rate (bpm): 146         General:  Alert, oriented and cooperative. Patient is in no acute distress.  Skin: Skin is warm and dry. No rash noted.   Cardiovascular: Normal heart rate noted  Respiratory: Normal respiratory effort, no problems with respiration noted  Abdomen: Soft, gravid, appropriate for gestational age. Pain/Pressure: Present     Pelvic:  Cervical exam deferred        Extremities: Normal range of motion.  Edema: None  Mental Status: Normal mood and affect. Normal behavior. Normal judgment and thought content.   Urinalysis:      Assessment and Plan:  Pregnancy: G2P0010 at 128w1d1. Supervision of other normal pregnancy, antepartum  2. Alpha trait thalassemia  3. Nausea and vomiting in pregnancy   Preterm labor symptoms and general obstetric precautions including but not limited to vaginal bleeding, contractions, leaking of fluid and fetal movement were reviewed in detail with the patient. Please refer to After Visit Summary for other counseling recommendations.    Return in about 4 weeks (around 01/03/2023) for ROB.   HaShelly BombardMD 12/06/22

## 2022-12-08 LAB — AFP, SERUM, OPEN SPINA BIFIDA
AFP MoM: 1.25
AFP Value: 41 ng/mL
Gest. Age on Collection Date: 16.1 weeks
Maternal Age At EDD: 30.1 yr
OSBR Risk 1 IN: 10000
Test Results:: NEGATIVE
Weight: 187 [lb_av]

## 2022-12-09 ENCOUNTER — Encounter: Payer: 59 | Admitting: Obstetrics and Gynecology

## 2022-12-17 ENCOUNTER — Telehealth: Payer: Self-pay | Admitting: Genetics

## 2022-12-17 NOTE — Telephone Encounter (Signed)
Called Kelly [name] to return her partner's carrier screening results. Left voicemail with [facility] call back number.

## 2022-12-17 NOTE — Telephone Encounter (Signed)
Kelly [name] returned genetic counseling's phone call. We reviewed that her partner's alpha thalassemia carrier screening results are negative. Given this result it is highly unlikely for the current pregnancy and any future pregnancies the couple has together to be affected. All questions answered.

## 2022-12-29 ENCOUNTER — Inpatient Hospital Stay (HOSPITAL_COMMUNITY): Admit: 2022-12-29 | Payer: 59

## 2022-12-29 ENCOUNTER — Inpatient Hospital Stay (HOSPITAL_COMMUNITY)
Admission: AD | Admit: 2022-12-29 | Discharge: 2022-12-29 | Disposition: A | Discharge status: Home / Self Care | Payer: 59 | Attending: Obstetrics and Gynecology | Admitting: Obstetrics and Gynecology

## 2022-12-29 ENCOUNTER — Encounter (HOSPITAL_COMMUNITY): Payer: Self-pay | Admitting: Obstetrics and Gynecology

## 2022-12-29 ENCOUNTER — Inpatient Hospital Stay (HOSPITAL_BASED_OUTPATIENT_CLINIC_OR_DEPARTMENT_OTHER): Payer: 59

## 2022-12-29 DIAGNOSIS — O26892 Other specified pregnancy related conditions, second trimester: Secondary | ICD-10-CM | POA: Insufficient documentation

## 2022-12-29 DIAGNOSIS — W108XXA Fall (on) (from) other stairs and steps, initial encounter: Secondary | ICD-10-CM

## 2022-12-29 DIAGNOSIS — R109 Unspecified abdominal pain: Secondary | ICD-10-CM

## 2022-12-29 DIAGNOSIS — S3991XA Unspecified injury of abdomen, initial encounter: Secondary | ICD-10-CM

## 2022-12-29 DIAGNOSIS — Z3A19 19 weeks gestation of pregnancy: Secondary | ICD-10-CM | POA: Insufficient documentation

## 2022-12-29 DIAGNOSIS — O9A212 Injury, poisoning and certain other consequences of external causes complicating pregnancy, second trimester: Secondary | ICD-10-CM | POA: Diagnosis not present

## 2022-12-29 DIAGNOSIS — Y9301 Activity, walking, marching and hiking: Secondary | ICD-10-CM | POA: Insufficient documentation

## 2022-12-29 NOTE — MAU Note (Signed)
.  Kelly [name] is a 32 y.o. at [redacted]w[redacted]d here in MAU reporting: fell down 3 stairs landed on her stomach at the movies yesterday. Had some mild cramping but feels better today just wanted to make sure everything was ok. No bleeding or leaking reported. Reports pelvic pressure but pt had that off and on before she fell. Was told it was round ligament pain. LMP:  Onset of complaint: yesterday Pain score: 0 Vitals:   12/29/22 1915  BP: 115/68  Pulse: 72  Resp: 18  Temp: 98.8 F (37.1 C)     FHT:152 Lab orders placed from triage:

## 2022-12-29 NOTE — MAU Provider Note (Signed)
History     CSN: [id number]  Arrival date and time: 12/29/22 1846   None     Chief Complaint  Patient presents with   Fall   Patient presenting today for evaluation after falling on her abdomen yesterday.  Went to see a movie and was walking up the stairs to get to her chair and slipped.  She reports she fell down 3 stairs and landed on her abdomen.  Reports initially she had some abdominal pain but that has improved.  Denies any bleeding, gush of fluid, contractions.  Is unsure if she is feeling baby movement because this is her first baby.  Denies any other symptoms.    OB History     Gravida  2   Para  0   Term  0   Preterm  0   AB  1   Living         SAB  1   IAB  0   Ectopic  0   Multiple      Live Births              Past Medical History:  Diagnosis Date   ACL tear     Past Surgical History:  Procedure Laterality Date   KNEE ARTHROSCOPY WITH ANTERIOR CRUCIATE LIGAMENT (ACL) REPAIR WITH HAMSTRING GRAFT Left 06/30/2020   Procedure: LEFT KNEE ARTHROSCOPY WITH ANTERIOR CRUCIATE LIGAMENT (ACL) RECONSTRUCTION, PARTIAL MEDIAL MENISCUS REPAIR;  Surgeon: Leandrew Koyanagi, MD;  Location: [place];  Service: Orthopedics;  Laterality: Left;    Family History  Problem Relation Age of Onset   Hypertension Mother    Diabetes Father    Hypertension Brother    Breast cancer Maternal Grandmother 54   Pancreatic cancer Maternal Grandmother     Social History   Tobacco Use   Smoking status: Never   Smokeless tobacco: Never  Vaping Use   Vaping Use: Never used  Substance Use Topics   Alcohol use: Not Currently    Comment: occas, prior to pregnancy   Drug use: Never    Allergies: No Known Allergies  Medications Prior to Admission  Medication Sig Dispense Refill Last Dose   Prenatal Vit-Fe Fumarate-FA (MULTIVITAMIN-PRENATAL) 27-0.8 MG TABS tablet Take 1 tablet by mouth daily at 12 noon.   Past Week   Doxylamine-Pyridoxine (DICLEGIS)  10-10 MG TBEC 1 tab in AM, 1 tab mid afternoon 2 tabs at bedtime. Max dose 4 tabs daily. 100 tablet 5    ondansetron (ZOFRAN-ODT) 4 MG disintegrating tablet Take 1 tablet (4 mg total) by mouth every 6 (six) hours as needed for nausea or vomiting. 20 tablet 1     Review of Systems  Constitutional:  Negative for appetite change, chills and fever.  HENT:  Negative for congestion and rhinorrhea.   Eyes:  Negative for visual disturbance.  Respiratory:  Negative for shortness of breath.   Cardiovascular:  Negative for chest pain.  Gastrointestinal:  Positive for abdominal pain. Negative for constipation, diarrhea, nausea and vomiting.  Genitourinary:  Negative for vaginal bleeding, vaginal discharge and vaginal pain.  Musculoskeletal:  Negative for myalgias.  Neurological:  Negative for light-headedness and headaches.   Physical Exam   Blood pressure 112/71, pulse 74, temperature 98.8 F (37.1 C), resp. rate 18, height 5\' 7"  (1.702 m), weight 86.2 kg, last menstrual period 08/15/2022, SpO2 100 %.  Physical Exam Vitals reviewed.  Constitutional:      Appearance: Normal appearance.  HENT:     Nose:  Nose normal.     Mouth/Throat:     Mouth: Mucous membranes are moist.  Eyes:     Extraocular Movements: Extraocular movements intact.  Cardiovascular:     Rate and Rhythm: Normal rate.  Pulmonary:     Effort: Pulmonary effort is normal.  Abdominal:     Tenderness: There is no abdominal tenderness. There is no guarding or rebound.  Musculoskeletal:        General: Normal range of motion.  Skin:    General: Skin is warm.     Capillary Refill: Capillary refill takes less than 2 seconds.  Neurological:     General: No focal deficit present.     Mental Status: She is alert.  Psychiatric:        Mood and Affect: Mood normal.     MAU Course  Procedures  MDM OB Limited ultrasound  Assessment and Plan  Kelly [name] is a 32 yo G2P0010 @[redacted]w[redacted]d  presenting after fall on her  abdomen.  Abdominal trauma Patient reports mild abdominal pain initially which has resolved.  Denies any bleeding, gush of fluid.  Limited OB ultrasound showing no abnormal findings.  No abruption or previa identified.  Patient discharged home with strict return precautions.  No further questions or concerns.  Concepcion Living 12/29/2022, 8:22 PM

## 2022-12-29 NOTE — Discharge Instructions (Signed)
It was a pleasure taking care of you today.  Everything looks good on the ultrasound we completed.  If you have any issues, bleeding, contractions, worsening pain please return for further evaluation.  Hope you have a wonderful day!

## 2023-01-05 ENCOUNTER — Encounter: Payer: Self-pay | Admitting: Advanced Practice Midwife

## 2023-01-05 NOTE — Progress Notes (Unsigned)
   PRENATAL VISIT NOTE  Subjective:  Kelly [name] is a 32 y.o. G2P0010 at [redacted]w[redacted]d being seen today for ongoing prenatal care.  She is currently monitored for the following issues for this low-risk pregnancy and has Adjustment disorder with mixed anxiety and depressed mood; ASCUS with positive high risk HPV cervical; Left anterior cruciate ligament tear; Acute medial meniscus tear of left knee; and Supervision of other normal pregnancy, antepartum on their problem list.  Patient reports nausea and Vomiting ~twice a week. .  Contractions: Not present. Vag. Bleeding: None.  Movement: Present. Denies leaking of fluid.   The following portions of the patient's history were reviewed and updated as appropriate: allergies, current medications, past family history, past medical history, past social history, past surgical history and problem list.   Objective:   Vitals:   01/06/23 0902  BP: 101/67  Pulse: 84  Weight: 187 lb (84.8 kg)    Fetal Status: Fetal Heart Rate (bpm): 146   Movement: Present     General:  Alert, oriented and cooperative. Patient is in no acute distress.  Skin: Skin is warm and dry. No rash noted.   Cardiovascular: Normal heart rate noted  Respiratory: Normal respiratory effort, no problems with respiration noted  Abdomen: Soft, gravid, appropriate for gestational age.  Pain/Pressure: Absent     Pelvic: Cervical exam deferred        Extremities: Normal range of motion.  Edema: None  Mental Status: Normal mood and affect. Normal behavior. Normal judgment and thought content.   Assessment and Plan:  Pregnancy: G2P0010 at [redacted]w[redacted]d 1. Supervision of other normal pregnancy, antepartum - Anatomy US 4/15  2. [redacted] weeks gestation of pregnancy - CBE recommended  3. ASCUS with positive high risk HPV cervical - Pap Nml  Preterm labor symptoms and general obstetric precautions including but not limited to vaginal bleeding, contractions, leaking of fluid and fetal movement were  reviewed in detail with the patient. Please refer to After Visit Summary for other counseling recommendations.   Return in about 4 weeks (around 02/03/2023) for ROB.  Future Appointments  Date Time Provider Department Center  01/13/2023  8:15 AM [facility] NURSE [facility]  01/13/2023  8:30 AM [facility] US2 [facility]    Dorathy Kinsman, CNM

## 2023-01-05 NOTE — Patient Instructions (Incomplete)
[email or web address]   [place] Pediatric Providers  Central/[place]outheast [place] ([address]) [facility] Brown, MD; Chambliss, MD; Eniola, MD; Hensel, MD; McDiarmid, MD; McIntyer, MD [address] [place], [place] [address] [phone] Mon-Fri 8:30-12:30, 1:30-5:00  Providers come to see babies during newborn hospitalization Only accepting infants of Mother's who are seen at Family Medicine Center or have siblings seen at   Family Medicine Center Medicaid - Yes; Tricare - Yes   [facility] Mulberry, MD [address] [place], [place] [address] [phone] Mon, Tue, Thur, Fri 8:30-5:00, Wed 10:00-7:00 (closed 1-2pm daily for lunch) Takes [place] residents with no insurance.  [place] [facility] only with Medicaid/insurance; Tricare - no  [facility] Ben-Davies, MD; Brown, MD; Chandler, MD; Ettefagh, MD; Grant, MD; Hanvey, MD; Herrin, MD; Jones,  MD; Lester, MD; McCormick, MD; McQueen, MD; Simha, MD; Stanley, MD; Stryffeler, NP [address], [place], [place] [address] [phone] Mon, Tue, Thur, Fri 8:30-5:30, Wed 9:30-5:30, Sat 8:30-12:30 Only accepting infants of first-time parents or siblings of current patients Hospital discharge coordinator will make follow-up appointment Medicaid - yes; Tricare - yes  [place]heast [place] ([address]) [facility] Cox, MD; Davis, MD; Dovico, MD; Ettefaugh, MD; Lowe, MD; Nation, MD; Slimp, MD; Sumner, MD; Williams, MD [address], [place], [place] [address] [phone] Mon-Fri 8:30-5:00, closed for lunch 12:30-1:30; Sat-Sun 10:00-1:00 Accepting Newborns with commercial insurance only, must call prior to delivery to be accepted into  practice.  Medicaid - no, Tricare - yes   [facility] [address] [place], [place] [address] [phone] or [phone] Mon to Fri 8am  to 10pm, Sat 8am to 1pm (virtual only on weekends) Only accepts Medicaid Healthy Blue pts  [facility]  Artis, MD; Coccaro, MD; Lockett Gardner, MD; Netherton, NP; Roper, MD; Wilmot, PA-C; Skinner, MD [address] [place], [place] [address] [phone] Mon-Fri 8:30-5:30 Medicaid - yes, Tricare - yes  [place] ([address]) [facility] Warner, MD [address], [place], [place] [address] [phone] Mon, Tues, Wed Fri 8:30-5:00, Sat 8:30-12:00, Closed Thursdays Accepting siblings of established patients and first time mom's if you call prenatally Medicaid- yes; Tricare - yes  [facility] Becker, PA; Hagler, MD; Quinn, PA-C; Scifres, PA; Sun, MD; Swayne, MD;  [address], [place], [place] [address] [phone] Mon-Fri 8:30-5:00, closed for lunch 1-2 Only accepting newborns of established patients Medicaid- no; Tricare - yes  [place] ([address]) [facility] Timberlake, MD; [address], [place], [place] [address] [phone] Mon-Fri 8:00-5:00 Medicaid - No; Tricare - Yes  [facility] [employer]  Brake, NP; Wharton, PA [address], [place], [place] [address] [phone] Mon-Fri 8:00-5:00 Medicaid - No, Tricare - Yes  [facility] Gay, MD; Quinlan, MD; Blatt, DNP [address] [place], [place] [address] [phone]  Mon-Fri 8:00-5:00 Medicaid - No; Tricare - Yes  [facility] [address] [place], [place] [address] [phone] Mon-Fri 8:30-5:00 (lunch 12:00-1:00) Medicaid -Yes; Tricare - Yes  [facility] Jordan, MD [address], [place], [place] [address] [phone] Mon-Fri 8:00-5:00 Seeing newborns of current patients only. No new patients Medicaid - No, Tricare - yes  [facility]  [place] Parker, MD [address] ,  [address] [phone] Mon-Fri 8:00-5:00 Medicaid -yes as   secondary coverage only; Tricare - yes  [facility] Brecken, PA; Christy, NP; Dees, MD; DeClaire, MD; DeWeese, MD; Hodge, PA; Smoot, NP; Summer, MD; Vapne, MD [address] [place], [place] [address] [phone] Mon-Fri 8:30-5:00, Sat 9:00-11:00 Accepts commercial insurance ONLY. Offers free prenatal information sessions for families. Medicaid - No, Tricare - Call first  [facility] Bouska, MD; Gordon, PA; Jeffery, PA; Weber, PA [address] [place] [address] [phone] Mon-Fri 7:30-5:30 Medicaid - Yes; Tricare - yes  [place] ([address] & [address])  [facility] Reese, MD [address] [place], [place] [address] [phone] Mon-Thur 8:00-6:00, closed for lunch 12-2, closed Fridays Medicaid - yes; Tricare - no  [facility] Anderson, NP; Badger, MD; Beal, PA; Spencer, PA [address], [place], [place] [address] [phone] Mon-Fri 7:30-4:30 Medicaid - yes, Tricare - yes  [facility]  Agbuya, MD; Klett, NP; Romgoolam, MD; Rothstein, NP [address], [place], [place] [address] [phone] Mon-Fri 8:30-5:00, closed for lunch 1-2, Sat 8:30-12:00 - sick visits only Providers come to see babies at [facility] Only accepting newborns of siblings and first time parents ONLY if who have met with office prior to delivery Medicaid -Yes; Tricare - yes  [facility] [place]  Golden, DO; Friddle, NP; Wallace, MD; Wood, MD:  [address], [place], [place] [address] [phone] Mon- Fri 8:00-5:00, Sat 9:00-12:00 - sick visits only Accepting siblings of established patients and first time mom/baby Medicaid - Yes; Tricare - yes Patients must have vaccinations (baby vaccines)  [place] ([address] & [address])  [facility] [address] [place], [place] [address] [phone] Mon-Fri 8:00-5:00 Medicaid - no; Tricare - yes  [facility] Briscoe, MD; Schmidt, PA; Moreira, PA [address], [place], [place] [address] [phone] Mon-Fri 8:00-5:00 Medicaid- yes; Tricare - yes  [facility] [place] Boyd, MD; Jones, NP; Osborn, PA [address], [place], [place] [address] [phone] Mon-Fri 8:00-5:00 Medicaid - Yes; Tricare - yes  [place] ([address])  [facility] Atkinson, PA; Calderon, PA; Cummings, MD; Dillard, MD; Henrish, NP; Isenhour, DO; Martin, PA; Olson, MD; Ott, MD; Phillips, MD; Valente, PA; VanDeven, PA; Yonjof, NP [address], [place], [place] [address] [phone] Mon-Fri 8:30-5:00, Sat 9:00-12:00 - sick only Please register online [email or web address] then schedule online or call office Medicaid-Yes; Tricare -yes  [facility]  Dabrusco, MD; Dial, MD; Reydon, MD; Fleenor, NP; Goolsby, PA; Tonuzi, MD; Turner, NP; West, MD [address], [place], [place] [address] [phone] Mon-Fri 8:00-5:30, Sat&Sun by appointment (phones open at 8:30) Medicaid - Yes; Tricare - yes  [place] ([address]) [facility] Allen, CPNP; Bates, MD; Gordon, MD; Mills, NP; Weinshilboum, DO [address], [place],  [address] [phone] M-F 8:00 - 5:15, Sat/Sun 9-12 sick visits only Medicaid - No; Tricare - yes  [facility] [place] [facility]  Brown, PA-C; Cowen, PA-C; Dennis, DO; Fuster, PA-C; Martin, PA-C; Shelton, PA-C; Spry, MD [address] [place],  [address] [phone] Mon-Thur 8:00-7:00, Fri 8:00-5:00 Accepting Medicaid for 13 and under only   [facility] [place] [facility] [place] Hayes, FNP; List, FNP; Moran, MD;   Pitonzo, PA-C; Scholer, MD; Spangle, FNP; Nzenwa, FNP; Jasper, MD; Moran, MD [address] [place], [place] [address] [phone] Mon-Fri 8:30-5:30 Medicaid - Yes; Tricare - yes  [facility] [address] [facility]  Kelly, CPNP; Logan, MD; Poth, MD; Ramadoss, MD; Staton, NP [address], [place], [place] [address] [phone] Mon-Thur 8:00-5:30, Fri 8:00-5:00, Sat 9:00-12:00 Medicaid - yes, Tricare - yes  [place] ([address])  [facility] [place] Masneri, DO; Meyers, MD; Nelson, PA [address], [place], [place] [address] [phone] Mon-Fri 8:00-5:00, closed for lunch 12-1 Medicaid - No; Tricare - yes  [facility] [place] McGowen, MD [address], [place], [place] [address] [phone] Mon-Fri 8:00-5:00 Medicaid - No; Tricare - yes  [facility] [place] MacDonald, MD; Nayak, MD; Kearns, MD; Jones, MD [address] [place] [address], [place], [place] [address] [phone] Mon-Fri 8:00-5:00 Medicaid- Yes; Tricare - yes  [place] ([address])  [facility] [place] [facility] Martin, PA-C; Tabori, MD [address], [place], [place] [address] [phone] Mon-Fri 8:00-5:00 Medicaid - No; Tricare - yes  [facility] [place]  Margin - CPNP [address], [place], [place] [address] [phone] Mon-Weds 8:00-6:00, Thurs-Fri 8:00-5:00, Sat 9:00-12:00 Medicaid - yes; Tricare - yes   [facility] [place] Aubuchon, MD; Brandon, PA [address] [place], [place] [address] [phone] Mon-Fri 8:00-5:00 Medicaid - yes; Tricare - yes  [place] Pediatric Providers  [facility] [address], [place], [place] [address] [phone] M, Thur: 8am -8pm, Tues, Weds: 8am - 5pm; Fri:  8-1 Medicaid - Yes; Tricare - yes  [facility] Mertz, MD; Johnson, MD; Wells, MD; Downs, PA; Hockenberger, PA [address], [place], [place] [address] [phone] M-F 8:30 - 5:00 Medicaid - Call office; Tricare -yes  [facility] Bonney, MD; Page, MD, Minter, MD; Mueller, PNP; Thomason, NP [address], [place], [place] [address] [phone] M-F 8:30 - 5:00, Sat/Sun 8:30 - 12:30 (sick visits) Medicaid - Call office; Tricare -yes  [facility] Lewis, MD; Shaub, PNP; Boylston, MD; Quaile, PA; Nonato, NP; Landon, CPNP [address], [place], [place] [address] [phone] M-F 8:30 - 5:00 Medicaid - Call office; Tricare - yes  [facility] [place] Cline, MD; Dvergsten, MD; Flores, MD; Kawatu, MD; Nogo, MD [address], [place], [place] [address] [phone] M-Thur: 8:00 - 5:00; Fri: 8:00 - 4:00 Medicaid - yes; Tricare - yes  [facility] [address] [place], [place], [place] [address] [phone] M-F: 8:30- 5:00, closed for lunch 12:30 - 1:00 Medicaid - yes; Tricare -yes  [facility] [place] [address], [place], [place] [address] [phone] M-F 8:00 - 5:00 Medicaid - yes; Tricare - yes   [facility] Johnson, DO; Rumball, DO; Wicker, NP [address], [place], [place] [address] [phone] M-F 8:00 - 5:00, Closed 12-1 for lunch Medicaid - Call; Tricare - yes  [facility] Stein, MD [address], [place], [place] [address] [phone] M-F: 8:00-5:00, Sat: 8:00 - noon Medicaid - call; Tricare -yes  [place] [facility] Providers  [facility] Collins, FNP-C [address], [place],  [address] [phone] M-W: 8:00-5:00, Thur: 8:00 - 7:00, Fri: 8:00 - noon Medicaid - yes; Tricare - yes  [facility] [place] Adams, FNP [address], [place], [place] [address] [phone] M-F 8:00 - 5:00,  Closed for lunch 12-1 Medicaid - yes; Tricare - yes  [place] [facility] Providers  [facility] Timiyah Romito, FNP, Melvin, MD, Fay, FNP-C [address], [place] [address] [place], [place], [place] [address] [phone] M-T 8:00-5:00, Wed-Fri 7:00-6:00 Medicaid - Yes; Tricare -yes  [facility] [place] Civiletti, DO; [address], [place], [place] [address] [phone] M-F 8:00 - 5:00, closed for lunch 12-1 Medicaid - Yes; Tricare - yes  [facility]  Barnes, MD; Bergdolt, MD; Caulfield, MD; Emrich, MD; Fiscus, MD; Hoppens, MD; Kylstra, MD, McPherson, MD; Todd, MD; Prestwood, MD; Waters, MD; Wood, MD [address], [place], [place] [address] [phone] M-F 8:00-5:00 Medicaid - yes; Tricare - yes  [facility] Cheema, MD (speaks Punjabi and Hindi) [address] [place], [place] [address] [phone] M-F: 8:30 - 5:00, closed 12:30 - 1 for lunch Medicaid - Yes; Tricare -yes  [place] [facility] Providers  [facility] Loda, MD; Timberlake, MD; Burke, MD [address], [place], [place] [address] [phone] M-Th: 8:00 - 5:30, Fri: 8:00 - 12:00 Medicaid - yes; Tricare - yes  [facility] [place] Lookabill, NP; Meier, MD; Daffron, MD [address], [place], [place] [address] [phone] M-F: 8:00 - 5:00 Medicaid - yes; Tricare - yes  [facility] Busse, NP; Bowman, NP; Baune, NP; Entwistle, MD; Williams, MD, Huffman, NP, Ferguson, MD; Patel, DO [address], [place], [place] [address] [phone] M-F: 8:30 - 5:30p Medicaid - yes; Tricare - yes Other locations available as well  [facility] [facility] Rajan, MD; Wilson, MD; Morgan, PA-C, Domenech, PA-C; Myers, PA-C [address], [place], [place] [address] [phone] M-W: 8:00am - 7:00pm, Thurs:  8:00am - 8:00pm; Fri: 8:00am - 5:00pm, closed daily from 12-1 for lunch Medicaid - yes; Tricare - yes  [place] [facility] Providers  [facility] [place] Adams, MD; Crystal, FNP; Hadley, MD; Stokes, MD; Johnson, PNP; Brady, PA-C; West, PNP; Gardner, MD;  [address], [place], [place] [address] [phone] M - Fri: 8am - 5pm, Sat 9-noon Medicaid - Yes; Tricare -yes  [facility] [place] Nayak, MD; Jones, FNP; [place], MD; Kearns, MD [address] [place] [address], [place], [place] [phone] M-F 8:00 - 5:00 Medicaid - call; Tricare - yes  [facility] [place] Bell, MD; Emory, PNP; Pinder, MD; Anderson, MD; Light, PA-C; Johnson, MD; Latta, MD; Saul, PNP; Rainey, MD; Clifford, MD; McClung, MD [address], [place], [place] [address] [phone] M-F 8:00am - 5:00pm; Sat. 9:00 - 11:00 Medicaid - yes; Tricare - yes  [facility] [place] Soldato-Couture, MD [address], [place], [place] [address] [phone] M-F 8:00 - 5:00 Medicaid - [place] Medicaid only; Tricare - yes  [facility] [place] Walker, MD; Davis, PNP; Ajizian, MD [address] [place] [address], [place],  [address] [phone] M-F 8:00 - 5:00 Medicaid - yes; Tricare - yes  [facility] [place] Barry, MD; Brown, MD, Forest, MD, Hazek, MD; Hoyle, MD; Johathon Overturf, MD; [address], [place],  [address] [phone] M-F: 8-5 Medicaid - yes; Tricare - yes  [facility] [place] Brady, Md; Dowlen, MD; [address] [place] [address], [place],  [address] [phone] M-F 8-5 Medicaid - yes; Tricare - yes  [facility] Kearns,   MD; Nayak, MD; Soldato-Courture, MD; Pellam-Palmer, DNP; Herring, PNP [address], [place], [place] [address] [phone] M-F 8-5 Medicaid - yes; Tricare - yes  [facility] Weathers, MD; Merritt, PA-C; Davis-PA-C; Warnimont, MD [address], [place], [place] [address] [phone] M-F 7am - 5 pm Medicaid - call; Tricare - yes  [facility] Hill, PNP; Erickson, MD; Robinson, MD [address], [place], [place] [address] [phone] M-F 8-5 Medicaid - yes; Tricare - yes  [facility] Kribbs, MD; Warner, MD; Williams, FNP; Brooks, FNP; Boles, FNP; Romblad, PA-C; [place] - FNP [address], [place], [place] [address] [phone] M-F 8-5 Medicaid- yes; Tricare - yes  [facility] [place] [place] Yoder, MD; Verenes, MD; Armentrout, MD; Stewart, MD; Beasley, CPNP; Ford, MD; Erickson, MD; Rice, MD [address], [place], [place] [address] [phone] M-F: 8-5, Sat: 9-4, Sun 9-12 Medicaid - yes; Tricare - yes  [facility] Little, PNP; Davis, PNP [address], [place], [place] [address] [phone] M-F 8 - 5, closed 12-1 for lunch Medicaid - yes; Tricare - yes  [facility] Friesen, MD; Cnegia, MD; Rice, MD; Patel, DO [address], [place], [place] [address] [phone] M-F 8- 5:30 Medicaid - yes; Tricare - yes  [facility] [place] Zvolensky, MD; Ray, MD; Haas, MD [address] [address], [place], [place] [address] [phone] M: 8-7; Tues-Fri: 8-5; Sat: 9-12 Medicaid - yes; Tricare - yes  [facility] [place] Heinrich, MD; Meyer, MD; Clark, MD; Rhyne, MD; Aubuchon, MD [address], [place], [place] [address] [phone] M: 8-7; Tues-Fri: 8-5; Sat: 8:30-12:30 Medicaid - yes; Tricare - yes  [facility] [place] Bista, MD; Dillard, PA [address], [place], [place]  [address] [phone] Mon-Fri: 8-5 Medicaid - yes; Tricare - yes  [facility] [place] Beasley, CPNP; Mahle, CPNP; Rice, MD; Duffy, MD; Culler, MD; [address], [place], [place] [address] [phone] M-F: 8-5, closed 1-2 for lunch Medicaid - yes; Tricare - yes  [facility] Rickman, PA; Mounce, NP; Mailey [place], MD; Jordan, CPNP; Darty, PA; Ball, MD; Wallace, MD [address] [place] [address], [place], [place] [address] [phone] M-Thurs: 8-7; Fri: 8-6; Sat: 9-12; Sun 2-4 Medicaid - yes; Tricare - yes  [facility] Brown, MD; Shin, MD; Goodman, DNP, FNP; Sebesta, DO; [address], [place], [place] [address] [phone] M-F: 8-5 Medicaid - yes; Tricare - yes  [place] [facility] Providers  [facility] Dough, MD; Welsh, NP [address], [place], [place] [address] [phone] M - Fri: 8am - 5pm, closed for lunch 12-1 Medicaid - Yes; Tricare - yes  [facility] Manandhar, MD; Riley, MD; Sanger, DO; Vinocur, MD;[place], PA; Walsh, PA; Campbell, NP [address], [place] [address] [phone] M-F 8:00 - 5:00, Sat 8:00 - 11:30 Medicaid - yes; Tricare - yes  [facility] Khan, MD; Redding, MD, Street, MD, Holt, MD, Burgart, MD; Rhyne, NP; Dickinson, PA;  [address] [place] [address], [place],  [address] [phone] M-F 8:10am - 5:00pm Medicaid - yes; Tricare -   yes  [facility] Connors, MD; Kime, NP [address], [place], [place] [address] [phone] M-F 8:00 - 5:00 Medicaid - [place] Medicaid only; Tricare - yes  [facility] [place] Whyte, MD; Fox, NP [address], [place], [place] [address] [phone] M-F 8:00 -  5:00; Closed for lunch 12 - 1:00 Medicaid - yes; Tricare - yes  [facility] Penner, MD; Wilburn, FNP [address], [place], [place] [address] [phone] Mon 9-5; Tues/Wed 10-5; Thurs 8:30-5; Fri: 8-12:30 Medicaid - yes; Tricare - yes  [place] [facility]  [facility]  Golding, MD; Jackson, PA-C [address], [place], [place] [address] [phone] phone [phone] fax M-F 7:15 - 4:30 Medicaid - yes; Tricare - yes  [facility] Gosrani, MD; Meccariello, DO [address] [place], [place] [address] [phone] M-Fri: 8:30 - 5:00, closed for lunch everyday noon - 1pm Medicaid - Yes; Tricare - yes  [facility] Burdine, MD; Daniel, MD; Howard, MD; Sasser, MD; Boles, PA; Boyd, PA-C; Carroll, PA; McGee, PA; Skillman, PA; Wilson, PA [address] [place], [place] [address] [phone] M-Thurs: 7:30am - 7:00pm; Friday 7:30am - 4pm; Sat: 8:00 - 1:00 Medicaid - Yes; Tricare - yes  [facility] [place] Akhbari, MD; Law, MD; Qayumi, MD; Salvador, DO [address], [place], [place] [address] [phone] M-Thur: 8:00 - 5:00, Fri: 8:00 - Noon Medicaid - yes; Tricare - yes No [place] [employer]  [place] [facility] [facility] Dettinger, MD; Gottschalk, DO; Hawks, NP; Martin, NP; Morgan, NP; Milian, NP; Rakes, NP; Stacks, MD; Webster, PA [address], [place], [place] [address] [phone] M-F 8:00 - 5:00 Medicaid - yes; Tricare - yes  [facility] Collins, FNP-C; Bucio, FNP-C [address], [place], [place] [address] [phone] M, W, R 8:00-5:00, Tues: 8:00am - 7:00pm; Fri 8:00 - noon Medicaid - Yes; Tricare - yes  [facility] Khan, MD [address] [place] [address] [place], [place] [address] [phone]  M-Thurs 8:30-5:30, Fri: 8:30-12:30pm Medicaid - Yes; Tricare - N  

## 2023-01-06 ENCOUNTER — Ambulatory Visit (INDEPENDENT_AMBULATORY_CARE_PROVIDER_SITE_OTHER): Payer: 59 | Admitting: Advanced Practice Midwife

## 2023-01-06 VITALS — BP 101/67 | HR 84 | Wt 187.0 lb

## 2023-01-06 DIAGNOSIS — Z3482 Encounter for supervision of other normal pregnancy, second trimester: Secondary | ICD-10-CM

## 2023-01-06 DIAGNOSIS — Z3A20 20 weeks gestation of pregnancy: Secondary | ICD-10-CM

## 2023-01-06 DIAGNOSIS — Z3480 Encounter for supervision of other normal pregnancy, unspecified trimester: Secondary | ICD-10-CM

## 2023-01-06 DIAGNOSIS — R87610 Atypical squamous cells of undetermined significance on cytologic smear of cervix (ASC-US): Secondary | ICD-10-CM

## 2023-01-06 DIAGNOSIS — R87810 Cervical high risk human papillomavirus (HPV) DNA test positive: Secondary | ICD-10-CM

## 2023-01-06 NOTE — Progress Notes (Signed)
Pt present for ROB. Still experiencing n/v in am. Taking PNV as tolerated.  Anatomy US scheduled 4/15.

## 2023-01-13 ENCOUNTER — Ambulatory Visit: Payer: 59 | Attending: Obstetrics

## 2023-01-13 ENCOUNTER — Ambulatory Visit: Payer: 59 | Admitting: *Deleted

## 2023-01-13 ENCOUNTER — Other Ambulatory Visit: Payer: Self-pay | Admitting: *Deleted

## 2023-01-13 VITALS — BP 118/61 | HR 86

## 2023-01-13 DIAGNOSIS — O99212 Obesity complicating pregnancy, second trimester: Secondary | ICD-10-CM | POA: Diagnosis not present

## 2023-01-13 DIAGNOSIS — Z3689 Encounter for other specified antenatal screening: Secondary | ICD-10-CM

## 2023-01-13 DIAGNOSIS — Z363 Encounter for antenatal screening for malformations: Secondary | ICD-10-CM | POA: Insufficient documentation

## 2023-01-13 DIAGNOSIS — Z3A21 21 weeks gestation of pregnancy: Secondary | ICD-10-CM | POA: Diagnosis not present

## 2023-01-13 DIAGNOSIS — Z3480 Encounter for supervision of other normal pregnancy, unspecified trimester: Secondary | ICD-10-CM | POA: Diagnosis present

## 2023-01-13 DIAGNOSIS — D563 Thalassemia minor: Secondary | ICD-10-CM

## 2023-01-13 DIAGNOSIS — Z362 Encounter for other antenatal screening follow-up: Secondary | ICD-10-CM

## 2023-02-03 ENCOUNTER — Encounter: Payer: Self-pay | Admitting: Advanced Practice Midwife

## 2023-02-03 ENCOUNTER — Ambulatory Visit (INDEPENDENT_AMBULATORY_CARE_PROVIDER_SITE_OTHER): Payer: 59 | Admitting: Advanced Practice Midwife

## 2023-02-03 VITALS — BP 120/73 | HR 95 | Wt 192.0 lb

## 2023-02-03 DIAGNOSIS — Z3480 Encounter for supervision of other normal pregnancy, unspecified trimester: Secondary | ICD-10-CM

## 2023-02-03 DIAGNOSIS — Z3A24 24 weeks gestation of pregnancy: Secondary | ICD-10-CM

## 2023-02-03 NOTE — Progress Notes (Signed)
   PRENATAL VISIT NOTE  Subjective:  Kelly [name] is a 32 y.o. G2P0010 at [redacted]w[redacted]d being seen today for ongoing prenatal care.  She is currently monitored for the following issues for this low-risk pregnancy and has Adjustment disorder with mixed anxiety and depressed mood; ASCUS with positive high risk HPV cervical; Left anterior cruciate ligament tear; Acute medial meniscus tear of left knee; and Supervision of other normal pregnancy, antepartum on their problem list.  Patient reports no complaints.  Contractions: Not present. Vag. Bleeding: None.  Movement: Present. Denies leaking of fluid.   The following portions of the patient's history were reviewed and updated as appropriate: allergies, current medications, past family history, past medical history, past social history, past surgical history and problem list.   Objective:   Vitals:   02/03/23 1614  BP: 120/73  Pulse: 95  Weight: 192 lb (87.1 kg)    Fetal Status: Fetal Heart Rate (bpm): 153 Fundal Height: 24 cm Movement: Present     General:  Alert, oriented and cooperative. Patient is in no acute distress.  Skin: Skin is warm and dry. No rash noted.   Cardiovascular: Normal heart rate noted  Respiratory: Normal respiratory effort, no problems with respiration noted  Abdomen: Soft, gravid, appropriate for gestational age.  Pain/Pressure: Absent     Pelvic: Cervical exam deferred        Extremities: Normal range of motion.  Edema: Trace  Mental Status: Normal mood and affect. Normal behavior. Normal judgment and thought content.   Assessment and Plan:  Pregnancy: G2P0010 at [redacted]w[redacted]d 1. Supervision of other normal pregnancy, antepartum --Anticipatory guidance about next visits/weeks of pregnancy given.  --Questions answered about GTT, scheduled for next visit --Pt prefers midwives, wants to avoid cesarean, discussed low c/s rate for [facility] overall --Possible interest in WB, information sent via MyChart, pt to take class, discuss at  future visits  2. [redacted] weeks gestation of pregnancy   Preterm labor symptoms and general obstetric precautions including but not limited to vaginal bleeding, contractions, leaking of fluid and fetal movement were reviewed in detail with the patient. Please refer to After Visit Summary for other counseling recommendations.   Return in about 4 weeks (around 03/03/2023) for LOB, Any provider, GTT at next visit.  Future Appointments  Date Time Provider Department Center  02/10/2023  3:30 PM [facility] NURSE [facility]  02/10/2023  3:45 PM [facility] US5 [facility]  03/03/2023  8:30 AM [facility] LAB [facility] None  03/03/2023  8:55 AM Sue Lush, FNP [facility] None    Sharen Counter, CNM

## 2023-02-03 NOTE — Progress Notes (Signed)
Pt presents for ROB visit. No concerns.  

## 2023-02-06 ENCOUNTER — Encounter: Payer: Self-pay | Admitting: *Deleted

## 2023-02-10 ENCOUNTER — Ambulatory Visit: Payer: 59 | Admitting: *Deleted

## 2023-02-10 ENCOUNTER — Ambulatory Visit: Payer: 59 | Attending: Obstetrics and Gynecology

## 2023-02-10 ENCOUNTER — Encounter: Payer: Self-pay | Admitting: *Deleted

## 2023-02-10 VITALS — BP 112/73 | HR 89

## 2023-02-10 DIAGNOSIS — O99212 Obesity complicating pregnancy, second trimester: Secondary | ICD-10-CM | POA: Insufficient documentation

## 2023-02-10 DIAGNOSIS — D563 Thalassemia minor: Secondary | ICD-10-CM

## 2023-02-10 DIAGNOSIS — Z3480 Encounter for supervision of other normal pregnancy, unspecified trimester: Secondary | ICD-10-CM

## 2023-02-10 DIAGNOSIS — E669 Obesity, unspecified: Secondary | ICD-10-CM

## 2023-02-10 DIAGNOSIS — O285 Abnormal chromosomal and genetic finding on antenatal screening of mother: Secondary | ICD-10-CM

## 2023-02-10 DIAGNOSIS — Z148 Genetic carrier of other disease: Secondary | ICD-10-CM | POA: Insufficient documentation

## 2023-02-10 DIAGNOSIS — Z362 Encounter for other antenatal screening follow-up: Secondary | ICD-10-CM | POA: Insufficient documentation

## 2023-02-10 DIAGNOSIS — Z3A25 25 weeks gestation of pregnancy: Secondary | ICD-10-CM | POA: Insufficient documentation

## 2023-02-13 ENCOUNTER — Other Ambulatory Visit: Payer: Self-pay

## 2023-02-13 DIAGNOSIS — Z362 Encounter for other antenatal screening follow-up: Secondary | ICD-10-CM

## 2023-02-13 DIAGNOSIS — Z3689 Encounter for other specified antenatal screening: Secondary | ICD-10-CM

## 2023-02-26 ENCOUNTER — Ambulatory Visit (INDEPENDENT_AMBULATORY_CARE_PROVIDER_SITE_OTHER): Payer: 59 | Admitting: Obstetrics & Gynecology

## 2023-02-26 ENCOUNTER — Other Ambulatory Visit: Payer: 59

## 2023-02-26 VITALS — BP 103/68 | HR 89 | Wt 189.9 lb

## 2023-02-26 DIAGNOSIS — Z3482 Encounter for supervision of other normal pregnancy, second trimester: Secondary | ICD-10-CM

## 2023-02-26 DIAGNOSIS — Z3A27 27 weeks gestation of pregnancy: Secondary | ICD-10-CM

## 2023-02-26 DIAGNOSIS — Z3480 Encounter for supervision of other normal pregnancy, unspecified trimester: Secondary | ICD-10-CM

## 2023-02-26 NOTE — Progress Notes (Signed)
   PRENATAL VISIT NOTE  Subjective:  Kelly [name] is a 32 y.o. G2P0010 at [redacted]w[redacted]d being seen today for ongoing prenatal care.  She is currently monitored for the following issues for this low-risk pregnancy and has Supervision of other normal pregnancy, antepartum on their problem list.  Patient reports no complaints.  Contractions: Irritability. Vag. Bleeding: None.  Movement: Present. Denies leaking of fluid.   The following portions of the patient's history were reviewed and updated as appropriate: allergies, current medications, past family history, past medical history, past social history, past surgical history and problem list.   Objective:   Vitals:   02/26/23 1047  BP: 103/68  Pulse: 89  Weight: 189 lb 14.4 oz (86.1 kg)    Fetal Status: Fetal Heart Rate (bpm): 150   Movement: Present     General:  Alert, oriented and cooperative. Patient is in no acute distress.  Skin: Skin is warm and dry. No rash noted.   Cardiovascular: Normal heart rate noted  Respiratory: Normal respiratory effort, no problems with respiration noted  Abdomen: Soft, gravid, appropriate for gestational age.  Pain/Pressure: Absent     Pelvic: Cervical exam deferred        Extremities: Normal range of motion.  Edema: Trace  Mental Status: Normal mood and affect. Normal behavior. Normal judgment and thought content.   Assessment and Plan:  Pregnancy: G2P0010 at [redacted]w[redacted]d 1. Supervision of other normal pregnancy, antepartum 28 week labs - CBC - Glucose Tolerance, 2 Hours w/1 Hour - HIV Antibody (routine testing w rflx) - RPR  Preterm labor symptoms and general obstetric precautions including but not limited to vaginal bleeding, contractions, leaking of fluid and fetal movement were reviewed in detail with the patient. Please refer to After Visit Summary for other counseling recommendations.   Return in about 4 weeks (around 03/26/2023).  Future Appointments  Date Time Provider Department Center   02/26/2023 11:15 AM Adam Phenix, MD [facility] None  03/26/2023 10:35 AM Brock Bad, MD [facility] None  04/07/2023  8:15 AM [facility] NURSE [facility]  04/07/2023  8:30 AM [facility] US2 [facility]    Scheryl Darter, MD

## 2023-02-26 NOTE — Progress Notes (Signed)
Pt presents for ROB. 28 wks, gtt today. Reports receiving Tdap in Jan 2024 for CMA during pregnancy.

## 2023-02-27 LAB — CBC
Hematocrit: 28.3 % — ABNORMAL LOW (ref 34.0–46.6)
Hemoglobin: 9.4 g/dL — ABNORMAL LOW (ref 11.1–15.9)
MCH: 28.5 pg (ref 26.6–33.0)
MCHC: 33.2 g/dL (ref 31.5–35.7)
MCV: 86 fL (ref 79–97)
Platelets: 264 10*3/uL (ref 150–450)
RBC: 3.3 x10E6/uL — ABNORMAL LOW (ref 3.77–5.28)
RDW: 11.8 % (ref 11.7–15.4)
WBC: 10.1 10*3/uL (ref 3.4–10.8)

## 2023-02-27 LAB — HIV ANTIBODY (ROUTINE TESTING W REFLEX): HIV Screen 4th Generation wRfx: NONREACTIVE

## 2023-02-27 LAB — GLUCOSE TOLERANCE, 2 HOURS W/ 1HR
Glucose, 1 hour: 166 mg/dL (ref 70–179)
Glucose, 2 hour: 108 mg/dL (ref 70–152)
Glucose, Fasting: 76 mg/dL (ref 70–91)

## 2023-02-27 LAB — RPR: RPR Ser Ql: NONREACTIVE

## 2023-03-03 ENCOUNTER — Other Ambulatory Visit: Payer: 59

## 2023-03-03 ENCOUNTER — Encounter: Payer: 59 | Admitting: Obstetrics and Gynecology

## 2023-03-13 ENCOUNTER — Encounter: Payer: Self-pay | Admitting: Obstetrics

## 2023-03-26 ENCOUNTER — Ambulatory Visit (INDEPENDENT_AMBULATORY_CARE_PROVIDER_SITE_OTHER): Payer: 59 | Admitting: Obstetrics

## 2023-03-26 ENCOUNTER — Encounter: Payer: Self-pay | Admitting: Obstetrics

## 2023-03-26 VITALS — BP 105/64 | HR 99 | Wt 194.2 lb

## 2023-03-26 DIAGNOSIS — Z3483 Encounter for supervision of other normal pregnancy, third trimester: Secondary | ICD-10-CM

## 2023-03-26 DIAGNOSIS — Z3A31 31 weeks gestation of pregnancy: Secondary | ICD-10-CM

## 2023-03-26 DIAGNOSIS — Z3480 Encounter for supervision of other normal pregnancy, unspecified trimester: Secondary | ICD-10-CM

## 2023-03-26 NOTE — Progress Notes (Signed)
Subjective:  Kelly [name] is a 32 y.o. G2P0010 at [redacted]w[redacted]d being seen today for ongoing prenatal care.  She is currently monitored for the following issues for this low-risk pregnancy and has Supervision of other normal pregnancy, antepartum on their problem list.  Patient reports heartburn.  Contractions: Not present. Vag. Bleeding: None.  Movement: Present. Denies leaking of fluid.   The following portions of the patient's history were reviewed and updated as appropriate: allergies, current medications, past family history, past medical history, past social history, past surgical history and problem list. Problem list updated.  Objective:   Vitals:   03/26/23 1054  BP: 105/64  Pulse: 99  Weight: 194 lb 3.2 oz (88.1 kg)    Fetal Status: Fetal Heart Rate (bpm): 137   Movement: Present     General:  Alert, oriented and cooperative. Patient is in no acute distress.  Skin: Skin is warm and dry. No rash noted.   Cardiovascular: Normal heart rate noted  Respiratory: Normal respiratory effort, no problems with respiration noted  Abdomen: Soft, gravid, appropriate for gestational age. Pain/Pressure: Present     Pelvic:  Cervical exam deferred        Extremities: Normal range of motion.  Edema: Trace  Mental Status: Normal mood and affect. Normal behavior. Normal judgment and thought content.   Urinalysis:      Assessment and Plan:  Pregnancy: G2P0010 at [redacted]w[redacted]d  1. Supervision of other normal pregnancy, antepartum   Preterm labor symptoms and general obstetric precautions including but not limited to vaginal bleeding, contractions, leaking of fluid and fetal movement were reviewed in detail with the patient. Please refer to After Visit Summary for other counseling recommendations.   Return in about 2 weeks (around 04/09/2023) for ROB.   Brock Bad, MD 03/26/23

## 2023-03-26 NOTE — Progress Notes (Signed)
Pt reports fetal movement with occasional pressure.  

## 2023-03-30 ENCOUNTER — Inpatient Hospital Stay (HOSPITAL_COMMUNITY)
Admission: AD | Admit: 2023-03-30 | Discharge: 2023-03-30 | Disposition: A | Discharge status: Home / Self Care | Payer: 59 | Attending: Obstetrics and Gynecology | Admitting: Obstetrics and Gynecology

## 2023-03-30 ENCOUNTER — Encounter (HOSPITAL_COMMUNITY): Payer: Self-pay | Admitting: Obstetrics and Gynecology

## 2023-03-30 DIAGNOSIS — E860 Dehydration: Secondary | ICD-10-CM | POA: Insufficient documentation

## 2023-03-30 DIAGNOSIS — O1203 Gestational edema, third trimester: Secondary | ICD-10-CM | POA: Diagnosis not present

## 2023-03-30 DIAGNOSIS — O99283 Endocrine, nutritional and metabolic diseases complicating pregnancy, third trimester: Secondary | ICD-10-CM | POA: Insufficient documentation

## 2023-03-30 DIAGNOSIS — Z3A32 32 weeks gestation of pregnancy: Secondary | ICD-10-CM | POA: Insufficient documentation

## 2023-03-30 DIAGNOSIS — O26893 Other specified pregnancy related conditions, third trimester: Secondary | ICD-10-CM | POA: Diagnosis present

## 2023-03-30 LAB — URINALYSIS, ROUTINE W REFLEX MICROSCOPIC
Bilirubin Urine: NEGATIVE
Glucose, UA: NEGATIVE mg/dL
Hgb urine dipstick: NEGATIVE
Ketones, ur: 20 mg/dL — AB
Nitrite: NEGATIVE
Protein, ur: NEGATIVE mg/dL
Specific Gravity, Urine: 1.018 (ref 1.005–1.030)
pH: 5 (ref 5.0–8.0)

## 2023-03-30 MED ORDER — PROCHLORPERAZINE MALEATE 10 MG PO TABS
10.0000 mg | ORAL_TABLET | Freq: Once | ORAL | Status: AC
  Administered 2023-03-30: 10 mg via ORAL
  Filled 2023-03-30: qty 1

## 2023-03-30 MED ORDER — LACTATED RINGERS IV BOLUS
1000.0000 mL | Freq: Once | INTRAVENOUS | Status: AC
  Administered 2023-03-30: 1000 mL via INTRAVENOUS

## 2023-03-30 MED ORDER — ACETAMINOPHEN-CAFFEINE 500-65 MG PO TABS
2.0000 | ORAL_TABLET | Freq: Once | ORAL | Status: AC
  Administered 2023-03-30: 2 via ORAL
  Filled 2023-03-30: qty 2

## 2023-03-30 NOTE — MAU Note (Signed)
Kelly [name] is a 32 y.o. at [redacted]w[redacted]d here in MAU reporting: since Thu, has noted an increase in swelling in feet and ankles. This morning started having pain radiating up her legs. Was at the beach, decided to come home early and get it checked out.  Last BP was 123/75.  Today started getting a HA, took some Tylenol, did help, but still has slight discomfort. Denies visual changes or epigastric pain. Denies bleeding or LOF. Reports +FM.  Onset of complaint: Thu Pain score: legs10, like a balloon that is ready to pop. HA 6 Vitals:   03/30/23 1616  BP: 111/65  Pulse: 96  Resp: 18  Temp: 98.1 F (36.7 C)  SpO2: 100%     FHT:146 Lab orders placed from triage:  urine

## 2023-03-30 NOTE — MAU Provider Note (Signed)
History     CSN: [id number]  Arrival date and time: 03/30/23 1558   Event Date/Time   First Provider Initiated Contact with Patient 03/30/23 1702      Chief Complaint  Patient presents with   Leg Swelling   Leg Pain   Headache   HPI [name] is a 32 y.o. year old G60P0010 female at [redacted]w[redacted]d weeks gestation who presents to MAU reporting increased swelling in feet and ankles since Thursday while at work. She was outside at the beach yesterday for about 30 mins and started feeling "very faint with blurry vision." So she went back inside. She reports she has had "about 9 bottles" of water to drink today. She noticed a "tingling" type pain that radiated up her legs from her feet to her knees today. She also reports a H/A this morning. She took Tylenol 1000 mg with some relief, but still has a "slight" H/A; rated 6/10. She was at [facility] today and decided to come back early to get it checked it out. Her BP at home today was 123/75. She denies visual changes, RUQ or epigastric pain. She receives [facility] with [facility]; next appt is 04/10/2023.  OB History     Gravida  2   Para  0   Term  0   Preterm  0   AB  1   Living         SAB  1   IAB  0   Ectopic  0   Multiple      Live Births              Past Medical History:  Diagnosis Date   ACL tear    Acute medial meniscus tear of left knee 06/20/2020   Acute medial meniscus tear of left knee 06/20/2020   Adjustment disorder with mixed anxiety and depressed mood 04/12/2014   ASCUS with positive high risk HPV cervical 05/13/2014   2015: Pap ASCUS + HR HPV  2015: Colpo CIN 1  2024 Nml   Left anterior cruciate ligament tear 06/20/2020   Vaginal Pap smear, abnormal     Past Surgical History:  Procedure Laterality Date   KNEE ARTHROSCOPY WITH ANTERIOR CRUCIATE LIGAMENT (ACL) REPAIR WITH HAMSTRING GRAFT Left 06/30/2020   Procedure: LEFT KNEE ARTHROSCOPY WITH ANTERIOR CRUCIATE LIGAMENT (ACL) RECONSTRUCTION, PARTIAL  MEDIAL MENISCUS REPAIR;  Surgeon: Tarry Kos, MD;  Location: [facility];  Service: Orthopedics;  Laterality: Left;    Family History  Problem Relation Age of Onset   Hypertension Mother    Diabetes Father    Hypertension Brother    Breast cancer Maternal Grandmother 52   Pancreatic cancer Maternal Grandmother     Social History   Tobacco Use   Smoking status: Never   Smokeless tobacco: Never  Vaping Use   Vaping Use: Never used  Substance Use Topics   Alcohol use: Not Currently    Comment: occas, prior to pregnancy   Drug use: Never    Allergies: No Known Allergies  Medications Prior to Admission  Medication Sig Dispense Refill Last Dose   Prenatal Vit-Fe Fumarate-FA (MULTIVITAMIN-PRENATAL) 27-0.8 MG TABS tablet Take 1 tablet by mouth daily at 12 noon.       Review of Systems  Constitutional: Negative.   HENT: Negative.    Eyes: Negative.   Respiratory: Negative.    Cardiovascular:  Positive for leg swelling.  Endocrine: Negative.   Genitourinary: Negative.   Musculoskeletal:  Discomfort in legs from swelling  Skin: Negative.   Allergic/Immunologic: Negative.   Neurological:  Positive for headaches.  Hematological: Negative.   Psychiatric/Behavioral: Negative.     Physical Exam  Patient Vitals for the past 24 hrs:  BP Temp Temp src Pulse Resp SpO2 Height Weight  03/30/23 1715 98/63 -- -- 96 -- -- -- --  03/30/23 1700 103/71 -- -- (!) 101 -- -- -- --  03/30/23 1645 103/69 -- -- 97 -- -- -- --  03/30/23 1643 106/70 -- -- 97 -- -- -- --  03/30/23 1616 111/65 98.1 F (36.7 C) Oral 96 18 100 % 5\' 7"  (1.702 m) 89.7 kg    Physical Exam Vitals and nursing note reviewed.  Constitutional:      Appearance: Normal appearance. She is obese.  Cardiovascular:     Rate and Rhythm: Normal rate.  Pulmonary:     Effort: Pulmonary effort is normal.  Abdominal:     Palpations: Abdomen is soft.  Musculoskeletal:        General: Normal range of  motion.     Right lower leg: 2+ Pitting Edema (2+) present.     Left lower leg: 2+ Pitting Edema (2+) present.  Skin:    General: Skin is warm and dry.  Neurological:     Mental Status: She is alert and oriented to person, place, and time.  Psychiatric:        Mood and Affect: Mood normal.        Behavior: Behavior normal.        Thought Content: Thought content normal.        Judgment: Judgment normal.    REACTIVE NST - FHR: 145 bpm / moderate variability / accels present / decels absent / TOCO: none MAU Course  Procedures  MDM CCUA LR Bolus 1 liter Compazine 10 mg po Excedrin Tension Headache 2 tablets -- "feeling much better"  Results for orders placed or performed during the hospital encounter of 03/30/23 (from the past 24 hour(s))  Urinalysis, Routine w reflex microscopic -Urine, Clean Catch     Status: Abnormal   Collection Time: 03/30/23  5:32 PM  Result Value Ref Range   Color, Urine YELLOW YELLOW   APPearance HAZY (A) CLEAR   Specific Gravity, Urine 1.018 1.005 - 1.030   pH 5.0 5.0 - 8.0   Glucose, UA NEGATIVE NEGATIVE mg/dL   Hgb urine dipstick NEGATIVE NEGATIVE   Bilirubin Urine NEGATIVE NEGATIVE   Ketones, ur 20 (A) NEGATIVE mg/dL   Protein, ur NEGATIVE NEGATIVE mg/dL   Nitrite NEGATIVE NEGATIVE   Leukocytes,Ua SMALL (A) NEGATIVE   RBC / HPF 0-5 0 - 5 RBC/hpf   WBC, UA 0-5 0 - 5 WBC/hpf   Bacteria, UA RARE (A) NONE SEEN   Squamous Epithelial / HPF 6-10 0 - 5 /HPF   Mucus PRESENT     Assessment and Plan  1. Mild dehydration - Information provided on rehydration and headache - Advised to continue drinking at least 8-10 water bottles daily, but increase it if going to be outside any amount of time  - Advised to get Excedrin Tensio H/A tablet OTC to have better control over H/A, but to avoid Tylenol use at the same time  2. Edema during pregnancy in third trimester - Increase water intake and elevate BLE - Information provided on peripheral edema -  Advised to go to medical supply place to be fitted for compression socks and they can also advise on the proper  mg/Hg to get them in   3. [redacted] weeks gestation of pregnancy   - Discharge patient - Keep scheduled appt with [facility] on 04/10/2023 - Patient verbalized an understanding of the plan of care and agrees.   Raelyn Mora, CNM 03/30/2023, 5:11 PM

## 2023-03-30 NOTE — Discharge Instructions (Addendum)
  Your headache is more than likely from mild dehydration. You can purchase this over the counter for headaches, if you would like better headache control. Do NOT take in combination with Tylenol. It contains the same active ingredient as Tylenol.

## 2023-04-07 ENCOUNTER — Ambulatory Visit: Payer: 59 | Admitting: *Deleted

## 2023-04-07 ENCOUNTER — Ambulatory Visit: Payer: 59 | Attending: Obstetrics

## 2023-04-07 VITALS — BP 111/61 | HR 90

## 2023-04-07 DIAGNOSIS — Z362 Encounter for other antenatal screening follow-up: Secondary | ICD-10-CM | POA: Insufficient documentation

## 2023-04-07 DIAGNOSIS — D563 Thalassemia minor: Secondary | ICD-10-CM | POA: Diagnosis not present

## 2023-04-07 DIAGNOSIS — Z3A33 33 weeks gestation of pregnancy: Secondary | ICD-10-CM

## 2023-04-07 DIAGNOSIS — O99213 Obesity complicating pregnancy, third trimester: Secondary | ICD-10-CM | POA: Diagnosis not present

## 2023-04-07 DIAGNOSIS — O99013 Anemia complicating pregnancy, third trimester: Secondary | ICD-10-CM | POA: Diagnosis not present

## 2023-04-07 DIAGNOSIS — Z3689 Encounter for other specified antenatal screening: Secondary | ICD-10-CM | POA: Diagnosis present

## 2023-04-07 DIAGNOSIS — E669 Obesity, unspecified: Secondary | ICD-10-CM

## 2023-04-10 ENCOUNTER — Ambulatory Visit (INDEPENDENT_AMBULATORY_CARE_PROVIDER_SITE_OTHER): Payer: 59 | Admitting: Obstetrics and Gynecology

## 2023-04-10 VITALS — BP 101/66 | HR 90 | Wt 192.0 lb

## 2023-04-10 DIAGNOSIS — Z3480 Encounter for supervision of other normal pregnancy, unspecified trimester: Secondary | ICD-10-CM

## 2023-04-10 DIAGNOSIS — Z3A34 34 weeks gestation of pregnancy: Secondary | ICD-10-CM

## 2023-04-10 DIAGNOSIS — Z3483 Encounter for supervision of other normal pregnancy, third trimester: Secondary | ICD-10-CM

## 2023-04-10 NOTE — Progress Notes (Signed)
   PRENATAL VISIT NOTE  Subjective:  Kelly [name] is a 32 y.o. G2P0010 at [redacted]w[redacted]d being seen today for ongoing prenatal care.  She is currently monitored for the following issues for this low-risk pregnancy and has Supervision of other normal pregnancy, antepartum on their problem list.  Patient doing well with no acute concerns today. She reports no complaints.  Contractions: Irritability. Vag. Bleeding: None.  Movement: Present. Denies leaking of fluid.   The following portions of the patient's history were reviewed and updated as appropriate: allergies, current medications, past family history, past medical history, past social history, past surgical history and problem list. Problem list updated.  Objective:   Vitals:   04/10/23 1347  BP: 101/66  Pulse: 90  Weight: 192 lb (87.1 kg)    Fetal Status: Fetal Heart Rate (bpm): 155 Fundal Height: 34 cm Movement: Present     General:  Alert, oriented and cooperative. Patient is in no acute distress.  Skin: Skin is warm and dry. No rash noted.   Cardiovascular: Normal heart rate noted  Respiratory: Normal respiratory effort, no problems with respiration noted  Abdomen: Soft, gravid, appropriate for gestational age.  Pain/Pressure: Present     Pelvic: Cervical exam deferred        Extremities: Normal range of motion.  Edema: Mild pitting, slight indentation  Mental Status:  Normal mood and affect. Normal behavior. Normal judgment and thought content.   Assessment and Plan:  Pregnancy: G2P0010 at [redacted]w[redacted]d  1. [redacted] weeks gestation of pregnancy   2. Supervision of other normal pregnancy, antepartum Continue routine prenatal care  Preterm labor symptoms and general obstetric precautions including but not limited to vaginal bleeding, contractions, leaking of fluid and fetal movement were reviewed in detail with the patient.  Please refer to After Visit Summary for other counseling recommendations.   Return in about 2 weeks (around  04/24/2023) for ROB, in person, 36 weeks swabs.   Mariel Aloe, MD Faculty Attending Center for [facility]

## 2023-04-24 ENCOUNTER — Other Ambulatory Visit (HOSPITAL_COMMUNITY)
Admission: RE | Admit: 2023-04-24 | Discharge: 2023-04-24 | Disposition: A | Discharge status: Home / Self Care | Payer: 59 | Source: Ambulatory Visit | Attending: Obstetrics and Gynecology | Admitting: Obstetrics and Gynecology

## 2023-04-24 ENCOUNTER — Encounter: Payer: Self-pay | Admitting: Obstetrics and Gynecology

## 2023-04-24 ENCOUNTER — Ambulatory Visit (INDEPENDENT_AMBULATORY_CARE_PROVIDER_SITE_OTHER): Payer: 59 | Admitting: Obstetrics and Gynecology

## 2023-04-24 VITALS — BP 112/70 | HR 73 | Wt 193.2 lb

## 2023-04-24 DIAGNOSIS — Z3480 Encounter for supervision of other normal pregnancy, unspecified trimester: Secondary | ICD-10-CM

## 2023-04-24 DIAGNOSIS — Z3A36 36 weeks gestation of pregnancy: Secondary | ICD-10-CM

## 2023-04-24 NOTE — Progress Notes (Signed)
Pt presents for ROB visit. No concerns.  

## 2023-04-24 NOTE — Patient Instructions (Signed)
Considering Waterbirth? Guide for patients at [facility] for [employer] [facility]) Why consider waterbirth? Gentle birth for babies  Less pain medicine used in labor  May allow for passive descent/less pushing  May reduce perineal tears  More mobility and instinctive maternal position changes  Increased maternal relaxation   Is waterbirth safe? What are the risks of infection, drowning or other complications? Infection:  Very low risk (3.7 % for tub vs 4.8% for bed)  7 in 8000 waterbirths with documented infection  Poorly cleaned equipment most common cause  Slightly lower group B strep transmission rate  Drowning  Maternal:  Very low risk  Related to seizures or fainting  Newborn:  Very low risk. No evidence of increased risk of respiratory problems in multiple large studies  Physiological protection from breathing under water  Avoid underwater birth if there are any fetal complications  Once baby's head is out of the water, keep it out.  Birth complication  Some reports of cord trauma, but risk decreased by bringing baby to surface gradually  No evidence of increased risk of shoulder dystocia. Mothers can usually change positions faster in water than in a bed, possibly aiding the maneuvers to free the shoulder.   There are 2 things you MUST do to have a waterbirth with [facility]: Attend a waterbirth class at [place] at [facility]   3rd Wednesday of every month from 7-9 pm (virtual during [place]) [employer] at [email or web address] or by calling [phone] Bring [place] the certificate from the class to your prenatal appointment or send via [place] with a midwife at 36 weeks* to see if you can still plan a waterbirth and to sign the consent.   *We also recommend that you schedule as many of your prenatal visits with a midwife as possible.    Helpful information: You may want to bring a bathing suit top to the hospital  to wear during labor but this is optional.  All other supplies are provided by the hospital. Please arrive at the hospital with signs of active labor, and do not wait at home until late in labor. It takes 45 min- 1 hour for fetal monitoring, and check in to your room to take place, plus transport and filling of the waterbirth tub.    Things that would prevent you from having a waterbirth: Premature, <37wks  Previous cesarean birth  Presence of thick meconium-stained fluid  Multiple gestation (Twins, triplets, etc.)  Uncontrolled diabetes or gestational diabetes requiring medication  Hypertension diagnosed in pregnancy or preexisting hypertension (gestational hypertension, preeclampsia, or chronic hypertension) Fetal growth restriction (your baby measures less than 10th percentile on ultrasound) Heavy vaginal bleeding  Non-reassuring fetal heart rate  Active infection (MRSA, etc.). Group B Strep is NOT a contraindication for waterbirth.  If your labor has to be induced and induction method requires continuous monitoring of the baby's heart rate  Other risks/issues identified by your obstetrical provider   Please remember that birth is unpredictable. Under certain unforeseeable circumstances your provider may advise against giving birth in the tub. These decisions will be made on a case-by-case basis and with the safety of you and your baby as our highest priority.    Updated 01/02/22  We highly recommend childbirth education to help you plan for labor and begin practicing coping skills (which will be needed with or without pain meds).  [place] Childbirth Education Options: Sign up by visiting [email or web address]  Childbirth ~ Self-Paced eClass (English  and Spanish) This online class offers you the freedom to complete a childbirth education series in the comfort of your own home at your own pace.  Childbirth Class (In-Person 4-Week Series  or on Saturdays, Virtual 4-Week Series ~  [place]) This interactive in-person class series will help you and your partner prepare for your birth experience. Topics include: Labor & Birth, Comfort Measures, Breathing Techniques, Massage, Medical Interventions, Pain Management Options, Cesarean Birth, Postpartum Care, and Newborn Care  Comfort Techniques for Labor ~ In-Person Class [facility]) This interactive class is designed for parents-to-be who want to learn & practice hands-on skills to help relieve some of the discomfort of labor and encourage their babies to rotate toward the best position for birth. Moms and their partners will be able to try a variety of labor positions with birth balls and rebozos as well as practice breathing, relaxation, and visualization techniques.  Natural Childbirth Class (In-Person 5-Week Series, In-Person on Saturdays or Virtual 5-Week Series ~ [place]) This class series is designed for expectant parents who want to learn and practice natural methods of coping with the process of labor and childbirth.  Cesarean Birth Self-Paced eClass (English and Spanish) This online course provides comprehensive information you can trust as you prepare for a possible cesarean birth. In this class, you'll learn how to make your birth and recovery comfortable and joyful through instructive video clips, animations, and activities.  Waterbirth ~ Architect Interested in a waterbirth? In addition to a consultation with your credentialed waterbirth provider, this free, informational online class will help you discover whether waterbirth is the right fit for you. Not all obstetrical practices offer waterbirth, so check with your healthcare provider.  Tour Scientist, research (physical sciences)) - Women's and [place] our 4 minute video tour of [place] located in [place].   [place] Parenting Education Options:  Pregnancy 101 (Virtual) Congratulations on your pregnancy!  This class is geared toward moms in their first trimester, but everyone is welcome. We are excited to guide you through all aspects of supporting a healthy pregnancy. You will learn what to expect at routine prenatal care appointments, common postpartum adjustments, basic infant safety, and breastfeeding.  Successful Partnering & Parenting ~ [place] Workshop [facility]) This workshop inspires and equips partners of all economic levels, ages, and cultures to confidently care for their infants, support the birthing persons, and navigate their own transformations into new partners and parents. Learning activities are geared towards supporting partner, but moms are welcome to attend.  'Baby & Me' Parenting Group (Virtual on Wednesdays at 11am) Enjoy this time discussing newborn & infant parenting topics and family adjustment issues with other new parents in a relaxed environment. Each week brings a new speaker or baby-centered activity. This group offers support and connection to parents as they journey through the adjustments and struggles of that sometimes overwhelming first year after the birth of a child.  Baby Safety, CPR, & Choking Class ~ Virtual This life-saving information is meant to encourage parents as they learn important safety and prevention tips as well as infant CPR and relief of choking.  Breastfeeding Class (In-Person in [place] or [employer]) Families learn what to expect in the first days and weeks of breastfeeding your newborn. IF YOU ARE AN EMPLOYEE TAKING THIS CLASS FOR CREDIT, DO NOT register yourself. Please e-mail [email or web address].   Breastfeeding Self-Paced eClass (English & Spanish) Families learn what to expect in the first days and weeks of breastfeeding your newborn.  Caring for Baby ~ In-Person, Virtual or Self-Paced Class This in-person class is for both expectant and adoptive parents who want to learn and practice the most up-to-date newborn care for their  babies. Focus is on birth through the first six weeks of life.  CPR & Choking Relief for Infants & Children ~ In-Person Class [facility]) This in-person course is designed for any parent, expectant parent, or adult who cares for infants or children. Participants learn and demonstrate cardiopulmonary resuscitation and choking relief procedures for both infants and children.  Grandparent Love ~ In-Person Class Grandparents will learn the most updated infant care and safety recommendations. They will discover ways to support their own children during the transition into the parenting role and receive tips on communicating with the new parents.  [place] Parenting Support Group Options:  Bereavement Grief Support Group (Pregnancy/Infant Loss) - Virtual This is an ongoing experience that meets once a month and is designed to help you honor the past, assist you in discovering tools to strengthen you today, and aid you in developing hope for the future.  Breastfeeding & Pumping Support Group (In-Person on Thursdays at 12pm or Virtual on Tuesdays at [place]) Join [place] in-person each Thursday starting June 1st, 2023 at 12pm! This support group is free for all families looking for breastfeeding and/or pumping support.   Community-Based Childbirth Education Options:  [facility] Department Classes:  Childbirth education classes can help you get ready for a positive parenting experience. You can also meet other expectant parents and get free stuff for your baby. Each class runs for five weeks on the same night and costs $45 for the mother-to-be and her support person. Medicaid covers the cost if you are eligible. Call [phone] to register.  [facility] offers a variety of programs for the [employer] and is another great way to get connected. Please go to [email or web address] for more information.  Childbirth With A Twist! Be informed of your options, get  educated on birth, understand what your body is doing, learn how to cope, and have a lot of fun and laughs all while doing it either from the comfort of your couch OR in our cozy office and classroom space near the [place] airport. If you are taking a virtual class, then class is taught LIVE, so you can ask questions and receive answers in real-time from an experienced doula and childbirth educator.  This virtual childbirth education class will meet for five instruction times online.  Although we are based in [place], [place], this virtual class is open to anyone in the world. Please visit: [email or web address] for more information.  Books We Love: The Doula Guide to Childbirth by Corinna Lines and Reola Calkins The First-Time Parent's Childbirth Handbook by Dr. Edmonia James, CNM The Birth Partner by Antionette Fairy

## 2023-04-24 NOTE — Progress Notes (Signed)
   PRENATAL VISIT NOTE  Subjective:  Kelly [name] is a 32 y.o. G2P0010 at [redacted]w[redacted]d being seen today for ongoing prenatal care.  She is currently monitored for the following issues for this low-risk pregnancy and has Supervision of other normal pregnancy, antepartum on their problem list.  Patient reports no complaints.  Contractions: Irritability. Vag. Bleeding: None.  Movement: Present. Denies leaking of fluid.   The following portions of the patient's history were reviewed and updated as appropriate: allergies, current medications, past family history, past medical history, past social history, past surgical history and problem list.   Objective:   Vitals:   04/24/23 0957  BP: 112/70  Pulse: 73  Weight: 193 lb 3.2 oz (87.6 kg)    Fetal Status: Fetal Heart Rate (bpm): 143   Movement: Present     General:  Alert, oriented and cooperative. Patient is in no acute distress.  Skin: Skin is warm and dry. No rash noted.   Cardiovascular: Normal heart rate noted  Respiratory: Normal respiratory effort, no problems with respiration noted  Abdomen: Soft, gravid, appropriate for gestational age.  Pain/Pressure: Present     Pelvic: Cervical exam deferred        Extremities: Normal range of motion.  Edema: Trace  Mental Status: Normal mood and affect. Normal behavior. Normal judgment and thought content.   Assessment and Plan:  Pregnancy: G2P0010 at [redacted]w[redacted]d 1. Supervision of other normal pregnancy, antepartum BP and FHR normal  Feeling regular fetal movement FH appropriate    2. [redacted] weeks gestation of pregnancy Swabs discussed and collected today Had expressed interest in waterbirth, has not completed class, thought she had to bring her out supplies and tub to hospital. Discussed what [facility] offers, encouraged to sign up for the class that is coming up and send certificate. Will schedule with midwife next visit. Discussed pain management in labor, and discussed natural labor class. Information  provided in wrap up   - Cervicovaginal ancillary only - Strep Gp B NAA  Preterm labor symptoms and general obstetric precautions including but not limited to vaginal bleeding, contractions, leaking of fluid and fetal movement were reviewed in detail with the patient. Please refer to After Visit Summary for other counseling recommendations.   Return in one week for routine prenatal  Future Appointments  Date Time Provider Department Center  05/01/2023 10:35 AM Raelyn Mora, CNM [facility] None     Albertine Grates, FNP

## 2023-05-01 ENCOUNTER — Encounter: Payer: Self-pay | Admitting: Obstetrics and Gynecology

## 2023-05-01 ENCOUNTER — Ambulatory Visit (INDEPENDENT_AMBULATORY_CARE_PROVIDER_SITE_OTHER): Payer: 59 | Admitting: Obstetrics and Gynecology

## 2023-05-01 VITALS — BP 116/73 | HR 89 | Wt 196.8 lb

## 2023-05-01 DIAGNOSIS — Z3A37 37 weeks gestation of pregnancy: Secondary | ICD-10-CM

## 2023-05-01 DIAGNOSIS — Z3480 Encounter for supervision of other normal pregnancy, unspecified trimester: Secondary | ICD-10-CM

## 2023-05-05 ENCOUNTER — Ambulatory Visit (INDEPENDENT_AMBULATORY_CARE_PROVIDER_SITE_OTHER): Payer: 59 | Admitting: Obstetrics and Gynecology

## 2023-05-05 ENCOUNTER — Encounter: Payer: Self-pay | Admitting: Obstetrics and Gynecology

## 2023-05-05 VITALS — BP 104/71 | HR 91 | Wt 195.0 lb

## 2023-05-05 DIAGNOSIS — Z3480 Encounter for supervision of other normal pregnancy, unspecified trimester: Secondary | ICD-10-CM

## 2023-05-05 NOTE — Progress Notes (Signed)
   LOW-RISK PREGNANCY OFFICE VISIT Patient name: Kelly [name] MRN [id number]  Date of birth: 08/07/1991 Chief Complaint:   Routine Prenatal Visit  History of Present Illness:   Kelly [name] is a 32 y.o. G47P0010 female at [redacted]w[redacted]d with an Estimated Date of Delivery: 05/22/23 being seen today for ongoing management of a low-risk pregnancy.  Today she reports no complaints. Contractions: Irritability. Vag. Bleeding: None.  Movement: Present. denies leaking of fluid. Review of Systems:   Pertinent items are noted in HPI Denies abnormal vaginal discharge w/ itching/odor/irritation, headaches, visual changes, shortness of breath, chest pain, abdominal pain, severe nausea/vomiting, or problems with urination or bowel movements unless otherwise stated above. Pertinent History Reviewed:  Reviewed past medical,surgical, social, obstetrical and family history.  Reviewed problem list, medications and allergies. Physical Assessment:   Vitals:   05/01/23 1044  BP: 116/73  Pulse: 89  Weight: 196 lb 12.8 oz (89.3 kg)  Body mass index is 30.82 kg/m.        Physical Examination:   General appearance: Well appearing, and in no distress  Mental status: Alert, oriented to person, place, and time  Skin: Warm & dry  Cardiovascular: Normal heart rate noted  Respiratory: Normal respiratory effort, no distress  Abdomen: Soft, gravid, nontender  Pelvic: Cervical exam deferred         Extremities: Edema: Trace  Fetal Status: Fetal Heart Rate (bpm): 140 Fundal Height: 34 cm Movement: Present Presentation: Vertex  No results found for this or any previous visit (from the past 24 hour(s)).  Assessment & Plan:  1) Low-risk pregnancy G2P0010 at [redacted]w[redacted]d with an Estimated Date of Delivery: 05/22/23   2) Supervision of other normal pregnancy, antepartum - Labor precautions - Lengthy discussion about waterbirth class and getting registered ASAP - Patient considering laboring in shower only and then getting an  epidural,if not able to get registered for H2O class.  3) [redacted] weeks gestation of pregnancy     Meds: No orders of the defined types were placed in this encounter.  Labs/procedures today: none  Plan:  Continue routine obstetrical care   Reviewed: Term labor symptoms and general obstetric precautions including but not limited to vaginal bleeding, contractions, leaking of fluid and fetal movement were reviewed in detail with the patient.  All questions were answered. Has home bp cuff. Check bp weekly, let us know if >140/90.   Follow-up: Return in about 1 week (around 05/08/2023) for Return OB visit.  No orders of the defined types were placed in this encounter.  Raelyn Mora MSN, CNM 05/01/2023 10:56 AM

## 2023-05-05 NOTE — Progress Notes (Signed)
ROB, Pt wants to know if baby is still head down.

## 2023-05-05 NOTE — Progress Notes (Signed)
   PRENATAL VISIT NOTE  Subjective:  Kelly [name] is a 32 y.o. G2P0010 at [redacted]w[redacted]d being seen today for ongoing prenatal care.  She is currently monitored for the following issues for this low-risk pregnancy and has Supervision of other normal pregnancy, antepartum on their problem list.  Patient reports no complaints.  Contractions: Irritability. Vag. Bleeding: None.  Movement: Present. Denies leaking of fluid.   The following portions of the patient's history were reviewed and updated as appropriate: allergies, current medications, past family history, past medical history, past social history, past surgical history and problem list.   Objective:   Vitals:   05/05/23 1050  BP: 104/71  Pulse: 91  Weight: 195 lb (88.5 kg)    Fetal Status: Fetal Heart Rate (bpm): 138 Fundal Height: 37 cm Movement: Present     General:  Alert, oriented and cooperative. Patient is in no acute distress.  Skin: Skin is warm and dry. No rash noted.   Cardiovascular: Normal heart rate noted  Respiratory: Normal respiratory effort, no problems with respiration noted  Abdomen: Soft, gravid, appropriate for gestational age.  Pain/Pressure: Present     Pelvic: Cervical exam deferred        Extremities: Normal range of motion.  Edema: Mild pitting, slight indentation  Mental Status: Normal mood and affect. Normal behavior. Normal judgment and thought content.   Pt informed that the ultrasound is considered a limited OB ultrasound and is not intended to be a complete ultrasound exam.  Patient also informed that the ultrasound is not being completed with the intent of assessing for fetal or placental anomalies or any pelvic abnormalities.  Explained that the purpose of today's ultrasound is to assess for  presentation.  Patient acknowledges the purpose of the exam and the limitations of the study.  Fetus in cephalic presentation  Assessment and Plan:  Pregnancy: G2P0010 at [redacted]w[redacted]d 1. Supervision of other normal  pregnancy, antepartum Patient is doing well without complaints Considering POP as a bridge for Nuv[place]  Term labor symptoms and general obstetric precautions including but not limited to vaginal bleeding, contractions, leaking of fluid and fetal movement were reviewed in detail with the patient. Please refer to After Visit Summary for other counseling recommendations.   Return in about 1 week (around 05/12/2023) for in person, ROB, Low risk.  No future appointments.  Catalina Antigua, MD

## 2023-05-10 ENCOUNTER — Inpatient Hospital Stay (HOSPITAL_COMMUNITY)
Admission: AD | Admit: 2023-05-10 | Discharge: 2023-05-11 | Disposition: A | Discharge status: Home / Self Care | Payer: 59 | Attending: Obstetrics and Gynecology | Admitting: Obstetrics and Gynecology

## 2023-05-10 ENCOUNTER — Encounter (HOSPITAL_COMMUNITY): Payer: Self-pay | Admitting: Obstetrics and Gynecology

## 2023-05-10 DIAGNOSIS — Z3689 Encounter for other specified antenatal screening: Secondary | ICD-10-CM

## 2023-05-10 DIAGNOSIS — O26893 Other specified pregnancy related conditions, third trimester: Secondary | ICD-10-CM | POA: Insufficient documentation

## 2023-05-10 DIAGNOSIS — R1030 Lower abdominal pain, unspecified: Secondary | ICD-10-CM | POA: Insufficient documentation

## 2023-05-10 DIAGNOSIS — O26899 Other specified pregnancy related conditions, unspecified trimester: Secondary | ICD-10-CM

## 2023-05-10 DIAGNOSIS — Z3A38 38 weeks gestation of pregnancy: Secondary | ICD-10-CM | POA: Insufficient documentation
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
## 2023-05-11 DIAGNOSIS — Z3A38 38 weeks gestation of pregnancy: Secondary | ICD-10-CM

## 2023-05-11 DIAGNOSIS — O26893 Other specified pregnancy related conditions, third trimester: Secondary | ICD-10-CM

## 2023-05-11 DIAGNOSIS — R1030 Lower abdominal pain, unspecified: Secondary | ICD-10-CM | POA: Diagnosis not present

## 2023-05-11 MED ORDER — ACETAMINOPHEN 325 MG PO TABS
650.0000 mg | ORAL_TABLET | Freq: Once | ORAL | Status: AC
  Administered 2023-05-11: 650 mg via ORAL
  Filled 2023-05-11: qty 2

## 2023-05-11 MED ORDER — ONDANSETRON 4 MG PO TBDP
8.0000 mg | ORAL_TABLET | Freq: Once | ORAL | Status: AC
  Administered 2023-05-11: 8 mg via ORAL
  Filled 2023-05-11: qty 2

## 2023-05-11 MED ORDER — CYCLOBENZAPRINE HCL 5 MG PO TABS: 10.0000 mg | ORAL_TABLET | Freq: Once | ORAL | Status: DC

## 2023-05-11 NOTE — Discharge Instructions (Signed)

## 2023-05-12 ENCOUNTER — Ambulatory Visit: Payer: 59 | Admitting: Obstetrics & Gynecology

## 2023-05-12 VITALS — BP 100/67 | HR 72 | Wt 198.0 lb

## 2023-05-12 DIAGNOSIS — Z3483 Encounter for supervision of other normal pregnancy, third trimester: Secondary | ICD-10-CM

## 2023-05-12 DIAGNOSIS — Z3A38 38 weeks gestation of pregnancy: Secondary | ICD-10-CM

## 2023-05-12 DIAGNOSIS — Z3480 Encounter for supervision of other normal pregnancy, unspecified trimester: Secondary | ICD-10-CM

## 2023-05-12 NOTE — Progress Notes (Signed)
   PRENATAL VISIT NOTE  Subjective:  Kelly [name] is a 32 y.o. G2P0010 at [redacted]w[redacted]d being seen today for ongoing prenatal care.  She is currently monitored for the following issues for this low-risk pregnancy and has Supervision of other normal pregnancy, antepartum on their problem list.  Patient reports occasional contractions.  Contractions: Irregular. Vag. Bleeding: None.  Movement: Present. Denies leaking of fluid.   The following portions of the patient's history were reviewed and updated as appropriate: allergies, current medications, past family history, past medical history, past social history, past surgical history and problem list.   Objective:   Vitals:   05/12/23 1415  BP: 100/67  Pulse: 72  Weight: 198 lb (89.8 kg)    Fetal Status: Fetal Heart Rate (bpm): 147 Fundal Height: 37 cm Movement: Present     General:  Alert, oriented and cooperative. Patient is in no acute distress.  Skin: Skin is warm and dry. No rash noted.   Cardiovascular: Normal heart rate noted  Respiratory: Normal respiratory effort, no problems with respiration noted  Abdomen: Soft, gravid, appropriate for gestational age.  Pain/Pressure: Present     Pelvic: Cervical exam deferred        Extremities: Normal range of motion.  Edema: Trace  Mental Status: Normal mood and affect. Normal behavior. Normal judgment and thought content.   Assessment and Plan:  Pregnancy: G2P0010 at [redacted]w[redacted]d 1. Supervision of other normal pregnancy, antepartum Was seen in MAU 2 days ago  Term labor symptoms and general obstetric precautions including but not limited to vaginal bleeding, contractions, leaking of fluid and fetal movement were reviewed in detail with the patient. Please refer to After Visit Summary for other counseling recommendations.   Return in about 1 week (around 05/19/2023).  No future appointments.  Scheryl Darter, MD

## 2023-05-17 ENCOUNTER — Inpatient Hospital Stay (HOSPITAL_COMMUNITY)
Admission: AD | Admit: 2023-05-17 | Discharge: 2023-05-19 | DRG: 807 | Disposition: A | Discharge status: Home / Self Care | Payer: 59 | Attending: Obstetrics and Gynecology | Admitting: Obstetrics and Gynecology

## 2023-05-17 ENCOUNTER — Inpatient Hospital Stay (HOSPITAL_COMMUNITY): Payer: 59 | Admitting: Anesthesiology

## 2023-05-17 ENCOUNTER — Encounter (HOSPITAL_COMMUNITY): Payer: Self-pay | Admitting: Obstetrics and Gynecology

## 2023-05-17 ENCOUNTER — Other Ambulatory Visit: Payer: Self-pay

## 2023-05-17 DIAGNOSIS — O9902 Anemia complicating childbirth: Principal | ICD-10-CM | POA: Diagnosis present

## 2023-05-17 DIAGNOSIS — Z3493 Encounter for supervision of normal pregnancy, unspecified, third trimester: Secondary | ICD-10-CM

## 2023-05-17 DIAGNOSIS — Z3403 Encounter for supervision of normal first pregnancy, third trimester: Secondary | ICD-10-CM

## 2023-05-17 DIAGNOSIS — Z3A39 39 weeks gestation of pregnancy: Secondary | ICD-10-CM

## 2023-05-17 DIAGNOSIS — Z3480 Encounter for supervision of other normal pregnancy, unspecified trimester: Principal | ICD-10-CM

## 2023-05-17 DIAGNOSIS — O26893 Other specified pregnancy related conditions, third trimester: Secondary | ICD-10-CM | POA: Diagnosis present

## 2023-05-17 DIAGNOSIS — Z789 Other specified health status: Secondary | ICD-10-CM | POA: Diagnosis present

## 2023-05-17 LAB — CBC
HCT: 34.8 % — ABNORMAL LOW (ref 36.0–46.0)
Hemoglobin: 11 g/dL — ABNORMAL LOW (ref 12.0–15.0)
MCH: 26.3 pg (ref 26.0–34.0)
MCHC: 31.6 g/dL (ref 30.0–36.0)
MCV: 83.3 fL (ref 80.0–100.0)
Platelets: 276 10*3/uL (ref 150–400)
RBC: 4.18 MIL/uL (ref 3.87–5.11)
RDW: 13.2 % (ref 11.5–15.5)
WBC: 9.4 10*3/uL (ref 4.0–10.5)
nRBC: 0 % (ref 0.0–0.2)

## 2023-05-17 LAB — COMPREHENSIVE METABOLIC PANEL
ALT: 13 U/L (ref 0–44)
AST: 18 U/L (ref 15–41)
Albumin: 2.8 g/dL — ABNORMAL LOW (ref 3.5–5.0)
Alkaline Phosphatase: 170 U/L — ABNORMAL HIGH (ref 38–126)
Anion gap: 11 (ref 5–15)
BUN: <5 mg/dL — ABNORMAL LOW (ref 6–20)
CO2: 19 mmol/L — ABNORMAL LOW (ref 22–32)
Calcium: 8.4 mg/dL — ABNORMAL LOW (ref 8.9–10.3)
Chloride: 107 mmol/L (ref 98–111)
Creatinine, Ser: 0.76 mg/dL (ref 0.44–1.00)
GFR, Estimated: >60 mL/min (ref >60)
Glucose, Bld: 87 mg/dL (ref 70–99)
Potassium: 3.6 mmol/L (ref 3.5–5.1)
Sodium: 137 mmol/L (ref 135–145)
Total Bilirubin: 0.7 mg/dL (ref 0.3–1.2)
Total Protein: 6.2 g/dL — ABNORMAL LOW (ref 6.5–8.1)

## 2023-05-17 LAB — NO BLOOD PRODUCTS

## 2023-05-17 MED ORDER — LACTATED RINGERS IV SOLN: INTRAVENOUS | Status: DC

## 2023-05-17 MED ORDER — IBUPROFEN 600 MG PO TABS
600.0000 mg | ORAL_TABLET | Freq: Four times a day (QID) | ORAL | Status: DC
  Administered 2023-05-17 – 2023-05-19 (×7): 600 mg via ORAL
  Filled 2023-05-17 (×7): qty 1

## 2023-05-17 MED ORDER — SIMETHICONE 80 MG PO CHEW: 80.0000 mg | CHEWABLE_TABLET | ORAL | Status: DC | PRN

## 2023-05-17 MED ORDER — OXYTOCIN-SODIUM CHLORIDE 30-0.9 UT/500ML-% IV SOLN
2.5000 [IU]/h | INTRAVENOUS | Status: DC
  Filled 2023-05-17: qty 500

## 2023-05-17 MED ORDER — FENTANYL-BUPIVACAINE-NACL 0.5-0.125-0.9 MG/250ML-% EP SOLN
12.0000 mL/h | EPIDURAL | Status: DC | PRN
  Administered 2023-05-17: 12 mL/h via EPIDURAL
  Filled 2023-05-17: qty 250

## 2023-05-17 MED ORDER — LACTATED RINGERS IV SOLN
500.0000 mL | Freq: Once | INTRAVENOUS | Status: AC
  Administered 2023-05-17: 500 mL via INTRAVENOUS

## 2023-05-17 MED ORDER — LIDOCAINE HCL (PF) 1 % IJ SOLN: 30.0000 mL | INTRAMUSCULAR | Status: DC | PRN

## 2023-05-17 MED ORDER — SOD CITRATE-CITRIC ACID 500-334 MG/5ML PO SOLN: 30.0000 mL | ORAL | Status: DC | PRN

## 2023-05-17 MED ORDER — PRENATAL MULTIVITAMIN CH
1.0000 | ORAL_TABLET | Freq: Every day | ORAL | Status: DC
  Administered 2023-05-18 – 2023-05-19 (×2): 1 via ORAL
  Filled 2023-05-17 (×2): qty 1

## 2023-05-17 MED ORDER — DIPHENHYDRAMINE HCL 50 MG/ML IJ SOLN: 12.5000 mg | INTRAMUSCULAR | Status: DC | PRN

## 2023-05-17 MED ORDER — OXYTOCIN-SODIUM CHLORIDE 30-0.9 UT/500ML-% IV SOLN: 2.5000 [IU]/h | INTRAVENOUS | Status: DC | PRN

## 2023-05-17 MED ORDER — ACETAMINOPHEN 325 MG PO TABS
650.0000 mg | ORAL_TABLET | ORAL | Status: DC | PRN
  Administered 2023-05-17 – 2023-05-18 (×2): 650 mg via ORAL
  Filled 2023-05-17 (×2): qty 2

## 2023-05-17 MED ORDER — BENZOCAINE-MENTHOL 20-0.5 % EX AERO
1.0000 | INHALATION_SPRAY | CUTANEOUS | Status: DC | PRN
  Administered 2023-05-17 – 2023-05-19 (×2): 1 via TOPICAL
  Filled 2023-05-17 (×2): qty 56

## 2023-05-17 MED ORDER — LIDOCAINE-EPINEPHRINE (PF) 1.5 %-1:200000 IJ SOLN
INTRAMUSCULAR | Status: DC | PRN
  Administered 2023-05-17: 5 mL via EPIDURAL

## 2023-05-17 MED ORDER — OXYTOCIN BOLUS FROM INFUSION
333.0000 mL | Freq: Once | INTRAVENOUS | Status: AC
  Administered 2023-05-17: 333 mL via INTRAVENOUS

## 2023-05-17 MED ORDER — ACETAMINOPHEN 325 MG PO TABS: 650.0000 mg | ORAL_TABLET | ORAL | Status: DC | PRN

## 2023-05-17 MED ORDER — DIBUCAINE (PERIANAL) 1 % EX OINT: 1.0000 | TOPICAL_OINTMENT | CUTANEOUS | Status: DC | PRN

## 2023-05-17 MED ORDER — PHENYLEPHRINE 80 MCG/ML (10ML) SYRINGE FOR IV PUSH (FOR BLOOD PRESSURE SUPPORT): 80.0000 ug | PREFILLED_SYRINGE | INTRAVENOUS | Status: DC | PRN

## 2023-05-17 MED ORDER — ONDANSETRON HCL 4 MG/2ML IJ SOLN: 4.0000 mg | INTRAMUSCULAR | Status: DC | PRN

## 2023-05-17 MED ORDER — OXYCODONE HCL 5 MG PO TABS: 5.0000 mg | ORAL_TABLET | Freq: Four times a day (QID) | ORAL | Status: DC | PRN

## 2023-05-17 MED ORDER — ONDANSETRON HCL 4 MG/2ML IJ SOLN
4.0000 mg | Freq: Four times a day (QID) | INTRAMUSCULAR | Status: DC | PRN
  Administered 2023-05-17: 4 mg via INTRAVENOUS
  Filled 2023-05-17: qty 2

## 2023-05-17 MED ORDER — SENNOSIDES-DOCUSATE SODIUM 8.6-50 MG PO TABS
2.0000 | ORAL_TABLET | Freq: Every day | ORAL | Status: AC
  Administered 2023-05-17 – 2023-05-18 (×2): 2 via ORAL
  Filled 2023-05-17 (×2): qty 2

## 2023-05-17 MED ORDER — EPHEDRINE 5 MG/ML INJ: 10.0000 mg | INTRAVENOUS | Status: DC | PRN

## 2023-05-17 MED ORDER — TRANEXAMIC ACID-NACL 1000-0.7 MG/100ML-% IV SOLN: 1000.0000 mg | Freq: Once | INTRAVENOUS | Status: DC | PRN

## 2023-05-17 MED ORDER — LACTATED RINGERS IV SOLN: 500.0000 mL | INTRAVENOUS | Status: DC | PRN

## 2023-05-17 MED ORDER — COCONUT OIL OIL
1.0000 | TOPICAL_OIL | Status: DC | PRN
  Administered 2023-05-18: 1 via TOPICAL

## 2023-05-17 MED ORDER — TRANEXAMIC ACID-NACL 1000-0.7 MG/100ML-% IV SOLN
1000.0000 mg | Freq: Once | INTRAVENOUS | Status: AC
  Administered 2023-05-17: 1000 mg via INTRAVENOUS
  Filled 2023-05-17: qty 100

## 2023-05-17 MED ORDER — TETANUS-DIPHTH-ACELL PERTUSSIS 5-2.5-18.5 LF-MCG/0.5 IM SUSY: 0.5000 mL | PREFILLED_SYRINGE | Freq: Once | INTRAMUSCULAR | Status: DC

## 2023-05-17 MED ORDER — ONDANSETRON HCL 4 MG PO TABS: 4.0000 mg | ORAL_TABLET | ORAL | Status: DC | PRN

## 2023-05-17 MED ORDER — DIPHENHYDRAMINE HCL 25 MG PO CAPS: 25.0000 mg | ORAL_CAPSULE | Freq: Four times a day (QID) | ORAL | Status: DC | PRN

## 2023-05-17 MED ORDER — POLYETHYLENE GLYCOL 3350 17 G PO PACK
17.0000 g | PACK | Freq: Every day | ORAL | Status: DC
  Administered 2023-05-18 – 2023-05-19 (×2): 17 g via ORAL
  Filled 2023-05-17 (×2): qty 1

## 2023-05-17 MED ORDER — FENTANYL CITRATE (PF) 100 MCG/2ML IJ SOLN
50.0000 ug | INTRAMUSCULAR | Status: DC | PRN
  Administered 2023-05-17: 50 ug via INTRAVENOUS
  Filled 2023-05-17: qty 2

## 2023-05-17 MED ORDER — WITCH HAZEL-GLYCERIN EX PADS: 1.0000 | MEDICATED_PAD | CUTANEOUS | Status: DC | PRN

## 2023-05-17 NOTE — Progress Notes (Signed)
L&D Note  05/17/2023 - 6:02 PM  32 y.o. G2P0010 [redacted]w[redacted]d. Pregnancy complicated by:  Patient Active Problem List   Diagnosis Date Noted   Supervision of normal pregnancy in third trimester 05/17/2023   Blood product declined 05/17/2023   Supervision of other normal pregnancy, antepartum 10/16/2022    Kelly [name] is admitted for active labor   Subjective:  Feeling more pressure.  Objective:    Current Vital Signs 24h Vital Sign Ranges  T 98.1 F (36.7 C) Temp  Avg: 98 F (36.7 C)  Min: 97.9 F (36.6 C)  Max: 98.1 F (36.7 C)  BP 106/61 BP  Min: 106/61  Max: 139/87  HR (!) 53 Pulse  Avg: 59.2  Min: 49  Max: 85  RR 16 Resp  Avg: 16  Min: 16  Max: 16  SaO2 100 %  (room air) SpO2  Avg: 99.8 %  Min: 99 %  Max: 100 %       24 Hour I/O Current Shift I/O  Time Ins Outs No intake/output data recorded. No intake/output data recorded.   Category I with accels, q13m UCs Gen: NAD SVE: complete, +1, feels DOA, +scalp stim  Labs:  Recent Labs  Lab 05/17/23 1314  WBC 9.4  HGB 11.0*  HCT 34.8*  PLT 276   Recent Labs  Lab 05/17/23 1406  NA 137  K 3.6  CL 107  CO2 19*  BUN <5*  CREATININE 0.76  CALCIUM 8.4*  PROT 6.2*  BILITOT 0.7  ALKPHOS 170*  ALT 13  AST 18  GLUCOSE 87    Medications Current Facility-Administered Medications  Medication Dose Route Frequency Provider Last Rate Last Admin   acetaminophen (TYLENOL) tablet 650 mg  650 mg Oral Q4H PRN [place] Bing, MD       diphenhydrAMINE (BENADRYL) injection 12.5 mg  12.5 mg Intravenous Q15 min PRN Stoltzfus, Gregory P, DO       ePHEDrine injection 10 mg  10 mg Intravenous PRN Stoltzfus, Gregory P, DO       ePHEDrine injection 10 mg  10 mg Intravenous PRN Stoltzfus, Gregory P, DO       fentaNYL (SUBLIMAZE) injection 50 mcg  50 mcg Intravenous Q1H PRN [place] Bing, MD   50 mcg at 05/17/23 1324   fentaNYL 2 mcg/mL w/ bupivacaine 0.125% in NS 250 mL epidural infusion  12 mL/hr Epidural Continuous PRN  Stoltzfus, Gregory P, DO 12 mL/hr at 05/17/23 1345 12 mL/hr at 05/17/23 1345   lactated ringers infusion 500-1,000 mL  500-1,000 mL Intravenous PRN [employer] Bing, MD       lactated ringers infusion   Intravenous Continuous [place] Bing, MD 125 mL/hr at 05/17/23 1320 New Bag at 05/17/23 1320   lidocaine (PF) (XYLOCAINE) 1 % injection 30 mL  30 mL Subcutaneous PRN [place] Bing, MD       ondansetron (ZOFRAN) injection 4 mg  4 mg Intravenous Q6H PRN [place] Bing, MD   4 mg at 05/17/23 1324   oxytocin (PITOCIN) IV BOLUS FROM BAG  333 mL Intravenous Once [place] Bing, MD       oxytocin (PITOCIN) IV infusion 30 units in NS 500 mL - Premix  2.5 Units/hr Intravenous Continuous  Bing, MD       PHENYLephrine 80 mcg/ml in normal saline Adult IV Push Syringe (For Blood Pressure Support)  80 mcg Intravenous PRN Stoltzfus, Gregory P, DO       PHENYLephrine 80 mcg/ml in normal saline Adult IV Push Syringe (For  Blood Pressure Support)  80 mcg Intravenous PRN Stoltzfus, Gregory P, DO       sodium citrate-citric acid (ORACIT) solution 30 mL  30 mL Oral Q2H PRN [place] Bing, MD       tranexamic acid (CYKLOKAPRON) IVPB 1,000 mg  1,000 mg Intravenous Once [place] Bing, MD       And   tranexamic acid (CYKLOKAPRON) IVPB 1,000 mg  1,000 mg Intravenous Once PRN [place] Bing, MD       Facility-Administered Medications Ordered in Other Encounters  Medication Dose Route Frequency Provider Last Rate Last Admin   lidocaine-EPINEPHrine 1.5 %-1:200000 injection   Epidural Anesthesia Intra-op Stoltzfus, Gregory P, DO   5 mL at 05/17/23 1344    Assessment & Plan:  Patient doing well *Pregnancy: fetal status reassuring *Labor: doing great on her own. Will start pushing. Anticipate SVD.  *GBS: neg *Analgesia: comfortable with epidural.   Kelly Copa MD Attending Center for [facility])

## 2023-05-17 NOTE — Discharge Summary (Shared)
Postpartum Discharge Summary  Date of Service updated***     Patient Name: Kelly [name] DOB: 12-Apr-1991 MRN: [id number]  Date of admission: 05/17/2023 Delivery date:05/17/2023 Delivering provider: Leander Bing Date of discharge: 05/17/2023  Admitting diagnosis: Pregnancy at 39/2. Active labor Secondary diagnosis:  Principal Problem:   Supervision of normal pregnancy in third trimester Active Problems:   Blood product declined     Discharge diagnosis: {DX.:23714}                                              Post partum procedures:{Postpartum procedures:23558} Augmentation: AROM Complications: None  Hospital course: Onset of Labor With Vaginal Delivery      32 y.o. yo G2P0010 at [redacted]w[redacted]d was admitted in {Labor:23688} on 05/17/2023. Labor course was complicated by***  Membrane Rupture Time/Date: 2:50 PM,05/17/2023  Delivery Method:  Operative Delivery:{Operative Delivery:30121} Episiotomy:  right mediolateral Lacerations:   2nd degree Patient had a postpartum course complicated by ***.  She is ambulating, tolerating a regular diet, passing flatus, and urinating well. Patient is discharged home in stable condition on 05/17/23.  Newborn Data: Birth date:05/17/2023 Birth time:6:37 PM Gender:Female Living status:Living Apgars: ,  Weight:   Magnesium Sulfate received: {Mag received:30440022} BMZ received: No Rhophylac:N/A MMR:{MMR:30440033} T-DaP:{Tdap:23962} Flu: {GUY:40347} Transfusion:{Transfusion received:30440034}  Physical exam  Vitals:   05/17/23 1702 05/17/23 1732 05/17/23 1846 05/17/23 1902  BP: 115/65 106/61 123/74 117/67  Pulse: (!) 54 (!) 53 (!) 204 77  Resp:  16    Temp:  98.1 F (36.7 C)    TempSrc:  Oral    SpO2:      Weight:      Height:       General: {Exam; general:21111117} Lochia: {Desc; appropriate/inappropriate:30686::"appropriate"} Uterine Fundus: {Desc; firm/soft:30687} Incision: {Exam; incision:21111123} DVT Evaluation: {Exam;  dvt:2111122} Labs: Lab Results  Component Value Date   WBC 9.4 05/17/2023   HGB 11.0 (L) 05/17/2023   HCT 34.8 (L) 05/17/2023   MCV 83.3 05/17/2023   PLT 276 05/17/2023      Latest Ref Rng & Units 05/17/2023    2:06 PM  CMP  Glucose 70 - 99 mg/dL 87   BUN 6 - 20 mg/dL <5   Creatinine 4.25 - 1.00 mg/dL 9.56   Sodium 387 - 564 mmol/L 137   Potassium 3.5 - 5.1 mmol/L 3.6   Chloride 98 - 111 mmol/L 107   CO2 22 - 32 mmol/L 19   Calcium 8.9 - 10.3 mg/dL 8.4   Total Protein 6.5 - 8.1 g/dL 6.2   Total Bilirubin 0.3 - 1.2 mg/dL 0.7   Alkaline Phos 38 - 126 U/L 170   AST 15 - 41 U/L 18   ALT 0 - 44 U/L 13    Edinburgh Score:     No data to display            After visit meds:  Allergies as of 05/17/2023   No Known Allergies   Med Rec must be completed prior to using this [facility]***        Discharge home in stable condition Infant Feeding: {Baby feeding:23562} Infant Disposition:{CHL IP OB HOME WITH PPIRJJ:88416} Discharge instruction: per After Visit Summary and Postpartum booklet. Activity: Advance as tolerated. Pelvic rest for 6 weeks.  Diet: {OB diet:21111121} Anticipated Birth Control: {Birth Control:23956} Postpartum Appointment:{Outpatient follow up:23559} Additional Postpartum F/U: {PP Procedure:23957} Future Appointments: [X]   4wk pp visit request sent on 8/17  Future Appointments  Date Time Provider Department Center  05/22/2023  9:55 AM Conan Bowens, MD [facility] None  05/29/2023  6:30 AM [facility] SCHED ROOM [facility] None   Follow up Visit:  Follow-up Information     [facility]. Go on 06/19/2023.   Contact information: [address] [place] [address] [phone]                    05/17/2023 [place] Bing, MD

## 2023-05-17 NOTE — MAU Provider Note (Signed)
Event Date/Time   First Provider Initiated Contact with Patient 05/17/23 1204      S: [name] is a 32 y.o. G2P0010 at [redacted]w[redacted]d  who presents to MAU today complaining contractions every few minutes apart since 0750. She reports bloody show but denies heavy vaginal bleeding. She denies LOF. She reports normal fetal movement.    O: BP 132/83   Pulse (!) 55   Temp 97.9 F (36.6 C) (Oral)   Resp 16   LMP 08/15/2022 (Exact Date)   SpO2 100%  GENERAL: Well-developed, well-nourished female in no acute distress.  HEAD: Normocephalic, atraumatic.  CHEST: Normal effort of breathing, regular heart rate ABDOMEN: Soft, nontender, gravid  Cervical exam:  Dilation: 4.5 Effacement (%): 90 Station: -1 Presentation: Vertex Exam by:: Georgina Snell, RN   Fetal Monitoring: Baseline: 125 Variability: moderate Accelerations: +15x15 present Decelerations: absent Contractions: Q 3-5 mins   A: SIUP at [redacted]w[redacted]d  Active labor Cervix 3 > 4.5   P: Admitted to L&D L&D team notified   Brand Males, CNM 05/17/2023 12:46 PM

## 2023-05-17 NOTE — Discharge Instructions (Signed)
Vaginal Delivery, Care After Refer to this sheet in the next few weeks. These discharge instructions provide you with information on caring for yourself after delivery. Your caregiver may also give you specific instructions. Your treatment has been planned according to the most current medical practices available, but problems sometimes occur. Call your caregiver if you have any problems or questions after you go home. HOME CARE INSTRUCTIONS Take over-the-counter or prescription medicines only as directed by your caregiver or pharmacist. Do not drink alcohol, especially if you are breastfeeding or taking medicine to relieve pain. Do not smoke tobacco. Continue to use good perineal care. Good perineal care includes: Wiping your perineum from back to front Keeping your perineum clean. You can do sitz baths twice a day, to help keep this area clean Do not use tampons, douche or have sex until your caregiver says it is okay. Shower only and avoid sitting in submerged water, aside from sitz baths Wear a well-fitting bra that provides breast support. Eat healthy foods. Drink enough fluids to keep your urine clear or pale yellow. Eat high-fiber foods such as whole grain cereals and breads, brown rice, beans, and fresh fruits and vegetables every day. These foods may help prevent or relieve constipation. Avoid constipation with high fiber foods or medications, such as miralax or metamucil Follow your caregiver's recommendations regarding resumption of activities such as climbing stairs, driving, lifting, exercising, or traveling. Talk to your caregiver about resuming sexual activities. Resumption of sexual activities is dependent upon your risk of infection, your rate of healing, and your comfort and desire to resume sexual activity. Try to have someone help you with your household activities and your newborn for at least a few days after you leave the hospital. Rest as much as possible. Try to rest or  take a nap when your newborn is sleeping. Increase your activities gradually. Keep all of your scheduled postpartum appointments. It is very important to keep your scheduled follow-up appointments. At these appointments, your caregiver will be checking to make sure that you are healing physically and emotionally. SEEK MEDICAL CARE IF:  You are passing large clots from your vagina. Save any clots to show your caregiver. You have a foul smelling discharge from your vagina. You have trouble urinating. You are urinating frequently. You have pain when you urinate. You have a change in your bowel movements. You have increasing redness, pain, or swelling near your vaginal incision (episiotomy) or vaginal tear. You have pus draining from your episiotomy or vaginal tear. Your episiotomy or vaginal tear is separating. You have painful, hard, or reddened breasts. You have a severe headache. You have blurred vision or see spots. You feel sad or depressed. You have thoughts of hurting yourself or your newborn. You have questions about your care, the care of your newborn, or medicines. You are dizzy or light-headed. You have a rash. You have nausea or vomiting. You were breastfeeding and have not had a menstrual period within 12 weeks after you stopped breastfeeding. You are not breastfeeding and have not had a menstrual period by the 12th week after delivery. You have a fever. SEEK IMMEDIATE MEDICAL CARE IF:  You have persistent pain. You have chest pain. You have shortness of breath. You faint. You have leg pain. You have stomach pain. Your vaginal bleeding saturates two or more sanitary pads in 1 hour. MAKE SURE YOU:  Understand these instructions. Will watch your condition. Will get help right away if you are not doing well or   get worse. Document Released: 09/13/2000 Document Revised: 01/31/2014 Document Reviewed: 05/13/2012 ExitCare Patient Information 2015 [employer]. This  information is not intended to replace advice given to you by your health care provider. Make sure you discuss any questions you have with your health care provider.  Sitz Bath A sitz bath is a warm water bath taken in the sitting position. The water covers only the hips and butt (buttocks). We recommend using one that fits in the toilet, to help with ease of use and cleanliness. It may be used for either healing or cleaning purposes. Sitz baths are also used to relieve pain, itching, or muscle tightening (spasms). The water may contain medicine. Moist heat will help you heal and relax.  HOME CARE  Take 3 to 4 sitz baths a day. Fill the bathtub half-full with warm water. Sit in the water and open the drain a little. Turn on the warm water to keep the tub half-full. Keep the water running constantly. Soak in the water for 15 to 20 minutes. After the sitz bath, pat the affected area dry. GET HELP RIGHT AWAY IF: You get worse instead of better. Stop the sitz baths if you get worse. MAKE SURE YOU: Understand these instructions. Will watch your condition. Will get help right away if you are not doing well or get worse. Document Released: 10/24/2004 Document Revised: 06/10/2012 Document Reviewed: 01/14/2011 ExitCare Patient Information 2015 [employer]. This information is not intended to replace advice given to you by your health care provider. Make sure you discuss any questions you have with your health care provider.   

## 2023-05-17 NOTE — Anesthesia Preprocedure Evaluation (Signed)
Anesthesia Evaluation  Patient identified by MRN, date of birth, ID band Patient awake    Reviewed: Allergy & Precautions, NPO status , Patient's Chart, lab work & pertinent test results  Airway Mallampati: II  TM Distance: >3 FB Neck ROM: Full    Dental no notable dental hx.    Pulmonary neg pulmonary ROS   Pulmonary exam normal        Cardiovascular negative cardio ROS  Rhythm:Regular Rate:Normal     Neuro/Psych negative neurological ROS  negative psych ROS   GI/Hepatic negative GI ROS, Neg liver ROS,,,  Endo/Other  negative endocrine ROS    Renal/GU negative Renal ROS  negative genitourinary   Musculoskeletal negative musculoskeletal ROS (+)    Abdominal Normal abdominal exam  (+)   Peds  Hematology Lab Results      Component                Value               Date                      WBC                      9.4                 05/17/2023                HGB                      11.0 (L)            05/17/2023                HCT                      34.8 (L)            05/17/2023                MCV                      83.3                05/17/2023                PLT                      276                 05/17/2023              Anesthesia Other Findings   Reproductive/Obstetrics (+) Pregnancy                             Anesthesia Physical Anesthesia Plan  ASA: 2  Anesthesia Plan: Epidural   Post-op Pain Management:    Induction:   PONV Risk Score and Plan: 2 and Treatment may vary due to age or medical condition  Airway Management Planned: Natural Airway  Additional Equipment: None  Intra-op Plan:   Post-operative Plan:   Informed Consent: I have reviewed the patients History and Physical, chart, labs and discussed the procedure including the risks, benefits and alternatives for the proposed anesthesia with the patient or authorized representative who has indicated  his/her understanding and acceptance.     Dental advisory given  Plan Discussed with:   Anesthesia Plan Comments:        Anesthesia Quick Evaluation

## 2023-05-17 NOTE — MAU Note (Signed)
..  Kelly [name] is a 32 y.o. at [redacted]w[redacted]d here in MAU reporting: ctx since 0750 this morning. Unsure how close they are, but she also is feeling pressure like she needs to have a BM. Endorses a small amount of dark brown bloody show when she wipes. No LOF. +FM.  Pain score: 10

## 2023-05-17 NOTE — Anesthesia Procedure Notes (Signed)
Epidural Patient location during procedure: OB Start time: 05/17/2023 1:35 PM End time: 05/17/2023 1:44 PM  Staffing Anesthesiologist: Atilano Median, DO Performed: anesthesiologist   Preanesthetic Checklist Completed: patient identified, IV checked, site marked, risks and benefits discussed, surgical consent, monitors and equipment checked, pre-op evaluation and timeout performed  Epidural Patient position: sitting Prep: ChloraPrep Patient monitoring: heart rate, continuous pulse ox and blood pressure Approach: midline Location: L3-L4 Injection technique: LOR saline  Needle:  Needle type: Tuohy  Needle gauge: 17 G Needle length: 9 cm Needle insertion depth: 8 cm Catheter type: closed end flexible Catheter size: 20 Guage Catheter at skin depth: 15 cm Test dose: negative and 1.5% lidocaine with Epi 1:200 K  Assessment Events: blood not aspirated, no cerebrospinal fluid, injection not painful, no injection resistance and no paresthesia  Additional Notes  Patient identified. Risks/Benefits/Options discussed with patient including but not limited to bleeding, infection, nerve damage, paralysis, failed block, incomplete pain control, headache, blood pressure changes, nausea, vomiting, reactions to medications, itching and postpartum back pain. Confirmed with bedside nurse the patient's most recent platelet count. Confirmed with patient that they are not currently taking any anticoagulation, have any bleeding history or any family history of bleeding disorders. Patient expressed understanding and wished to proceed. All questions were answered. Sterile technique was used throughout the entire procedure. Please see nursing notes for vital signs. Test dose was given through epidural catheter and negative prior to continuing to dose epidural or start infusion. Warning signs of high block given to the patient including shortness of breath, tingling/numbness in hands, complete motor block,  or any concerning symptoms with instructions to call for help. Patient was given instructions on fall risk and not to get out of bed. All questions and concerns addressed with instructions to call with any issues or inadequate analgesia.    Reason for block:procedure for pain

## 2023-05-17 NOTE — Progress Notes (Signed)
Obstetrics Admission History & Physical  05/17/2023 - 12:48 PM Primary OBGYN: Kelly [name]  Chief Complaint: worsening contractions  History of Present Illness  32 y.o. G2P0010 at [redacted]w[redacted]d, with the above CC. Pregnancy complicated by: nothing.  Kelly [name] states that she had worsening contractions. Was 3cm on initial triage check and no 4-5cm with worsening pains. No s/s of LOF/ROM  Review of Systems:  as noted in the History of Present Illness. PMHx:  Past Medical History:  Diagnosis Date   ACL tear    Acute medial meniscus tear of left knee 06/20/2020   Acute medial meniscus tear of left knee 06/20/2020   Adjustment disorder with mixed anxiety and depressed mood 04/12/2014   ASCUS with positive high risk HPV cervical 05/13/2014   2015: Pap ASCUS + HR HPV  2015: Colpo CIN 1  2024 Nml   Left anterior cruciate ligament tear 06/20/2020   Vaginal Pap smear, abnormal    PSHx:  Past Surgical History:  Procedure Laterality Date   KNEE ARTHROSCOPY WITH ANTERIOR CRUCIATE LIGAMENT (ACL) REPAIR WITH HAMSTRING GRAFT Left 06/30/2020   Procedure: LEFT KNEE ARTHROSCOPY WITH ANTERIOR CRUCIATE LIGAMENT (ACL) RECONSTRUCTION, PARTIAL MEDIAL MENISCUS REPAIR;  Surgeon: Tarry Kos, MD;  Location: [facility];  Service: Orthopedics;  Laterality: Left;   Medications:  Medications Prior to Admission  Medication Sig Dispense Refill Last Dose   Prenatal Vit-Fe Fumarate-FA (MULTIVITAMIN-PRENATAL) 27-0.8 MG TABS tablet Take 1 tablet by mouth daily at 12 noon.   05/17/2023     Allergies: has No Known Allergies. OBHx:  OB History  Gravida Para Term Preterm AB Living  2 0 0 0 1    SAB IAB Ectopic Multiple Live Births  1 0 0        # Outcome Date GA Lbr Len/2nd Weight Sex Type Anes PTL Lv  2 Current           1 SAB 2014                  FHx:  Family History  Problem Relation Age of Onset   Hypertension Mother    Diabetes Father    Hypertension Brother    Breast cancer Maternal  Grandmother 25   Pancreatic cancer Maternal Grandmother    Soc Hx:  Social History   Socioeconomic History   Marital status: Married    Spouse name: Not on file   Number of children: Not on file   Years of education: Not on file   Highest education level: Not on file  Occupational History   Not on file  Tobacco Use   Smoking status: Never   Smokeless tobacco: Never  Vaping Use   Vaping status: Never Used  Substance and Sexual Activity   Alcohol use: Not Currently    Comment: occas, prior to pregnancy   Drug use: Never   Sexual activity: Not Currently    Partners: Male    Birth control/protection: None    Comment: pt reports IC this past week  Other Topics Concern   Not on file  Social History Narrative   ** Merged History Encounter **       Social Determinants of Health   Financial Resource Strain: Not on file  Food Insecurity: Not on file  Transportation Needs: Not on file  Physical Activity: Not on file  Stress: Not on file  Social Connections: Unknown (02/10/2022)   Received from [employer]   Social Network    Social Network: Not  on file  Intimate Partner Violence: Unknown (01/03/2022)   Received from [facility]   HITS    Physically Hurt: Not on file    Insult or Talk Down To: Not on file    Threaten Physical Harm: Not on file    Scream or Curse: Not on file    Objective    Current Vital Signs 24h Vital Sign Ranges  T 97.9 F (36.6 C) Temp  Avg: 97.9 F (36.6 C)  Min: 97.9 F (36.6 C)  Max: 97.9 F (36.6 C)  BP 132/83 BP  Min: 127/82  Max: 139/87  HR (!) 55 Pulse  Avg: 54.8  Min: 49  Max: 61  RR 16 Resp  Avg: 16  Min: 16  Max: 16  SaO2 100 %  (room air) SpO2  Avg: 100 %  Min: 100 %  Max: 100 %       24 Hour I/O Current Shift I/O  Time Ins Outs No intake/output data recorded. No intake/output data recorded.   EFM: 135 baseline, +accels, no decel, mod variability  Toco: q3-48m  General: Well nourished, well developed female in no acute  distress.  Skin:  Warm and dry.  Cardiovascular: S1, S2 normal, no murmur, rub or gallop, regular rate and rhythm Respiratory:  Clear to auscultation bilateral. Normal respiratory effort Abdomen: gravid, nttp, cephalic, 3700gm Neuro/Psych:  Normal mood and affect.   SVE: 4/90/-1/cephalic at 1245pm per RN  Labs  pending  Radiology none  Assessment & Plan   32 y.o. G2P0010 @ [redacted]w[redacted]d in active labor; pt stable *Pregnancy: category I with accels *Labor: pt desires epidural. Reassess after placement and offer AROM *GBS: negative *No blood products: I d/w her re: this. I told her that we would only ever give blood or products if we thought it was life saving. I asked her if she would still decline in those situations and she said yes. Type and screen cancelled. F/u admit cbc. Hgb 9.4 on 5/29  Cornelia Copa MD Attending Center for [facility] Midwife)

## 2023-05-18 LAB — CBC
HCT: 26.7 % — ABNORMAL LOW (ref 36.0–46.0)
Hemoglobin: 8.5 g/dL — ABNORMAL LOW (ref 12.0–15.0)
MCH: 26.5 pg (ref 26.0–34.0)
MCHC: 31.8 g/dL (ref 30.0–36.0)
MCV: 83.2 fL (ref 80.0–100.0)
Platelets: 223 10*3/uL (ref 150–400)
RBC: 3.21 MIL/uL — ABNORMAL LOW (ref 3.87–5.11)
RDW: 13.1 % (ref 11.5–15.5)
WBC: 12.7 10*3/uL — ABNORMAL HIGH (ref 4.0–10.5)
nRBC: 0 % (ref 0.0–0.2)

## 2023-05-18 LAB — RPR: RPR Ser Ql: NONREACTIVE

## 2023-05-18 MED ORDER — SENNOSIDES-DOCUSATE SODIUM 8.6-50 MG PO TABS: 2.0000 | ORAL_TABLET | Freq: Every day | ORAL | 1 refills | Status: DC

## 2023-05-18 MED ORDER — IBUPROFEN 600 MG PO TABS: 600.0000 mg | ORAL_TABLET | Freq: Four times a day (QID) | ORAL | 1 refills | Status: DC

## 2023-05-18 MED ORDER — NORETHINDRONE 0.35 MG PO TABS: 1.0000 | ORAL_TABLET | Freq: Every day | ORAL | 3 refills | Status: DC

## 2023-05-18 MED ORDER — FERROUS SULFATE 325 (65 FE) MG PO TBEC: 325.0000 mg | DELAYED_RELEASE_TABLET | ORAL | 1 refills | Status: DC

## 2023-05-18 MED ORDER — ACETAMINOPHEN 325 MG PO TABS: 650.0000 mg | ORAL_TABLET | ORAL | 1 refills | Status: DC | PRN

## 2023-05-18 NOTE — Anesthesia Postprocedure Evaluation (Signed)
Anesthesia Post Note  Patient: Nurse, mental health  Procedure(s) Performed: AN AD HOC LABOR EPIDURAL     Patient location during evaluation: Mother Baby Anesthesia Type: Epidural Level of consciousness: awake, oriented and awake and alert Pain management: pain level controlled Vital Signs Assessment: post-procedure vital signs reviewed and stable Respiratory status: spontaneous breathing, respiratory function stable and nonlabored ventilation Cardiovascular status: stable Postop Assessment: no headache, adequate PO intake, no apparent nausea or vomiting, able to ambulate and patient able to bend at knees Anesthetic complications: no   No notable events documented.  Last Vitals:  Vitals:   05/18/23 0157 05/18/23 0500  BP: (!) 99/59 111/73  Pulse: 72 66  Resp: 17 16  Temp: 36.7 C 36.6 C  SpO2: 100% 99%    Last Pain:  Vitals:   05/18/23 0500  TempSrc: Oral  PainSc: 7    Pain Goal:                   Kelly [name]

## 2023-05-18 NOTE — Progress Notes (Signed)
POSTPARTUM PROGRESS NOTE  Subjective: Kelly [name] is a 32 y.o. G2P1011 s/p NSVD at [redacted]w[redacted]d.  She reports she is doing well. No acute events overnight. She denies any problems with ambulating, voiding or po intake. Denies nausea or vomiting. She has passed flatus. Pain is well controlled.  Lochia is light.  Objective: Blood pressure 112/72, pulse 62, temperature 97.9 F (36.6 C), temperature source Oral, resp. rate 18, height 5\' 7"  (1.702 m), weight 90.7 kg, last menstrual period 08/15/2022, SpO2 100%, unknown if currently breastfeeding.  Physical Exam:  General: alert, cooperative and no distress Chest: no respiratory distress Abdomen: soft, non-tender  Uterine Fundus: firm and at level of umbilicus Extremities: No calf swelling or tenderness  Trace edema  Recent Labs    05/17/23 1314 05/18/23 0458  HGB 11.0* 8.5*  HCT 34.8* 26.7*    Assessment/Plan: Kelly [name] is a 32 y.o. G2P1011 s/p NSVD at [redacted]w[redacted]d.  Routine Postpartum Care: Doing well, pain well-controlled.  -- Continue routine care, lactation support  -- Contraception: POPs -- Feeding: Breast  Dispo: Plan for discharge 8/19.  Joanne Gavel, MD OB Fellow 05/18/2023 3:28 PM

## 2023-05-18 NOTE — H&P (Signed)
Obstetrics Admission History & Physical  05/17/2023 - 12:48 PM Primary OBGYN: [facility]  Chief Complaint: worsening contractions  History of Present Illness  32 y.o. G2P0010 at [redacted]w[redacted]d, with the above CC. Pregnancy complicated by: nothing.  [name] states that she had worsening contractions. Was 3cm on initial triage check and no 4-5cm with worsening pains. No s/s of LOF/ROM  Review of Systems:  as noted in the History of Present Illness. PMHx:  Past Medical History:  Diagnosis Date   ACL tear    Acute medial meniscus tear of left knee 06/20/2020   Acute medial meniscus tear of left knee 06/20/2020   Adjustment disorder with mixed anxiety and depressed mood 04/12/2014   ASCUS with positive high risk HPV cervical 05/13/2014   2015: Pap ASCUS + HR HPV  2015: Colpo CIN 1  2024 Nml   Left anterior cruciate ligament tear 06/20/2020   Vaginal Pap smear, abnormal    PSHx:  Past Surgical History:  Procedure Laterality Date   KNEE ARTHROSCOPY WITH ANTERIOR CRUCIATE LIGAMENT (ACL) REPAIR WITH HAMSTRING GRAFT Left 06/30/2020   Procedure: LEFT KNEE ARTHROSCOPY WITH ANTERIOR CRUCIATE LIGAMENT (ACL) RECONSTRUCTION, PARTIAL MEDIAL MENISCUS REPAIR;  Surgeon: Tarry Kos, MD;  Location: [facility];  Service: Orthopedics;  Laterality: Left;   Medications:  Medications Prior to Admission  Medication Sig Dispense Refill Last Dose   Prenatal Vit-Fe Fumarate-FA (MULTIVITAMIN-PRENATAL) 27-0.8 MG TABS tablet Take 1 tablet by mouth daily at 12 noon.   05/17/2023     Allergies: has No Known Allergies. OBHx:  OB History  Gravida Para Term Preterm AB Living  2 0 0 0 1    SAB IAB Ectopic Multiple Live Births  1 0 0        # Outcome Date GA Lbr Len/2nd Weight Sex Type Anes PTL Lv  2 Current           1 SAB 2014                  FHx:  Family History  Problem Relation Age of Onset   Hypertension Mother    Diabetes Father    Hypertension Brother    Breast cancer Maternal  Grandmother 25   Pancreatic cancer Maternal Grandmother    Soc Hx:  Social History   Socioeconomic History   Marital status: Married    Spouse name: Not on file   Number of children: Not on file   Years of education: Not on file   Highest education level: Not on file  Occupational History   Not on file  Tobacco Use   Smoking status: Never   Smokeless tobacco: Never  Vaping Use   Vaping status: Never Used  Substance and Sexual Activity   Alcohol use: Not Currently    Comment: occas, prior to pregnancy   Drug use: Never   Sexual activity: Not Currently    Partners: Male    Birth control/protection: None    Comment: pt reports IC this past week  Other Topics Concern   Not on file  Social History Narrative   ** Merged History Encounter **       Social Determinants of Health   Financial Resource Strain: Not on file  Food Insecurity: Not on file  Transportation Needs: Not on file  Physical Activity: Not on file  Stress: Not on file  Social Connections: Unknown (02/10/2022)   Received from [employer]   Social Network    Social Network: Not  on file  Intimate Partner Violence: Unknown (01/03/2022)   Received from [facility]   HITS    Physically Hurt: Not on file    Insult or Talk Down To: Not on file    Threaten Physical Harm: Not on file    Scream or Curse: Not on file    Objective    Current Vital Signs 24h Vital Sign Ranges  T 97.9 F (36.6 C) Temp  Avg: 97.9 F (36.6 C)  Min: 97.9 F (36.6 C)  Max: 97.9 F (36.6 C)  BP 132/83 BP  Min: 127/82  Max: 139/87  HR (!) 55 Pulse  Avg: 54.8  Min: 49  Max: 61  RR 16 Resp  Avg: 16  Min: 16  Max: 16  SaO2 100 %  (room air) SpO2  Avg: 100 %  Min: 100 %  Max: 100 %       24 Hour I/O Current Shift I/O  Time Ins Outs No intake/output data recorded. No intake/output data recorded.   EFM: 135 baseline, +accels, no decel, mod variability  Toco: q3-48m  General: Well nourished, well developed female in no acute  distress.  Skin:  Warm and dry.  Cardiovascular: S1, S2 normal, no murmur, rub or gallop, regular rate and rhythm Respiratory:  Clear to auscultation bilateral. Normal respiratory effort Abdomen: gravid, nttp, cephalic, 3700gm Neuro/Psych:  Normal mood and affect.   SVE: 4/90/-1/cephalic at 1245pm per RN  Labs  pending  Radiology none  Assessment & Plan   32 y.o. G2P0010 @ [redacted]w[redacted]d in active labor; pt stable *Pregnancy: category I with accels *Labor: pt desires epidural. Reassess after placement and offer AROM *GBS: negative *No blood products: I d/w her re: this. I told her that we would only ever give blood or products if we thought it was life saving. I asked her if she would still decline in those situations and she said yes. Type and screen cancelled. F/u admit cbc. Hgb 9.4 on 5/29  Cornelia Copa MD Attending Center for [facility] Midwife)

## 2023-05-18 NOTE — Lactation Note (Signed)
This note was copied from a baby's chart. Lactation Consultation Note  Patient Name: Kelly [name] [id number] Date: 05/18/2023 Age:30 hours Reason for consult: Initial assessment;Primapara;1st time breastfeeding;Infant weight loss;Term;RN request;Mother's request (2 % weight loss) and RN request.  As LC entered the baby was STS. Per mom the baby last fed at 10 am for 12 mins.  LC was describing the football position to mom and she requested to see if the baby would latch. Baby woke up with diaper check ( dry ) .  LC placed him STS  on the right breast , LC noted some areola edema when mom sandwich the areola and LC showed her how to do the reverse pressure.  Baby latched for 10 mins with swallows increasing with breast compressions and warm moist heat.  LC assessed the other nipple after feeding to check sizing for the flange and noted the areola was dry and cracking , and the areola was more edematous.  LC plan :  LC started mom on the shells , coconut oil and steps for latching.  1st breast - breast massage, hand express, pre-pump 10 -20 strokes to stretch the nipple / areola complex and reverse pressure as shown, Firm support and football position until the baby is consistently latching.     Maternal Data Has patient been taught Hand Expression?: Yes (areola edema) Does the patient have breastfeeding experience prior to this delivery?: No  Feeding Mother's Current Feeding Choice: Breast Milk  LATCH Score Latch: Grasps breast easily, tongue down, lips flanged, rhythmical sucking.  Audible Swallowing: A few with stimulation (increased to 2)  Type of Nipple: Everted at rest and after stimulation (areola edema , reverse pressure helped to make the tissue more compressible)  Comfort (Breast/Nipple): Soft / non-tender  Hold (Positioning): Assistance needed to correctly position infant at breast and maintain latch.  LATCH Score: 8   Lactation Tools Discussed/Used  Shells, Hand  pump - Flange #21 / #24 F , coconut oil   Interventions Interventions: Breast feeding basics reviewed;Assisted with latch;Skin to skin;Breast massage;Hand express;Reverse pressure;Breast compression;Adjust position;Support pillows;Position options;Shells;Hand pump;Education;LC Services brochure  Discharge Pump: DEBP;Personal;Manual  Consult Status Consult Status: Follow-up Date: 05/19/23 Follow-up type: In-patient    Kelly [name] 05/18/2023, 12:12 PM

## 2023-05-18 NOTE — Progress Notes (Signed)
Patient ID: Kelly [name], female   DOB: 1991/07/03, 32 y.o.   MRN: [id number] Attending Circumcision Counseling Progress Note  Patient desires circumcision for her female infant.  Circumcision procedure details discussed, risks and benefits of procedure were also discussed.  These include but are not limited to: Benefits of circumcision in men include reduction in the rates of urinary tract infection (UTI), penile cancer, some sexually transmitted infections, penile inflammatory and retractile disorders, as well as easier hygiene.  Risks include bleeding , infection, injury of glans which may lead to penile deformity or urinary tract issues, unsatisfactory cosmetic appearance and other potential complications related to the procedure.  It was emphasized that this is an elective procedure.  Patient wants to proceed with circumcision; written informed consent obtained.  Will do circumcision soon, routine circumcision and post circumcision care ordered for the infant.  Kelly [name], M.D. 05/18/2023 9:45 AM

## 2023-05-18 NOTE — Progress Notes (Addendum)
Post Partum Day 1 Subjective: no complaints  Objective: Blood pressure 111/73, pulse 66, temperature 97.9 F (36.6 C), temperature source Oral, resp. rate 16, height 5\' 7"  (1.702 m), weight 90.7 kg, last menstrual period 08/15/2022, SpO2 99%, unknown if currently breastfeeding.  Physical Exam:  General: alert Lochia: appropriate Uterine Fundus: firm Incision: NA DVT Evaluation: No evidence of DVT seen on physical exam.  Recent Labs    05/17/23 1314 05/18/23 0458  HGB 11.0* 8.5*  HCT 34.8* 26.7*    Assessment/Plan: Plan for discharge tomorrow   LOS: 1 day   Hermina Staggers, MD 05/18/2023, 9:44 AM

## 2023-05-19 NOTE — Lactation Note (Signed)
This note was copied from a baby's chart. Lactation Consultation Note  Patient Name: Boy Anele Pinks ZOXWR'U Date: 05/19/2023 Age:30 hours Reason for consult: Follow-up assessment;1st time breastfeeding  P1, Baby recently was formula fed 16 ml..  Reivewed paced feeding and provided mother with white Nfant nipple.  Reviewed how to pump.  21 mm flanges seem appropriate at this time.  Mother pumped 10 ml+.  She will give ebm to baby when he wakes. Suggest offering breast before formula.   Feed on demand with cues.  Goal 8-12+ times per day after first 24 hrs.  Place baby STS if not cueing. Wake baby for feeding if needed.  Recommend pumping q 3 hours if baby continues to not latch.  Pacifier use not recommended at this time.     Maternal Data Has patient been taught Hand Expression?: Yes Does the patient have breastfeeding experience prior to this delivery?: No  Feeding Mother's Current Feeding Choice: Breast Milk and Formula  Lactation Tools Discussed/Used Tools: Pump;Flanges Flange Size: 21 Breast pump type: Double-Electric Breast Pump;Manual Pump Education: Setup, frequency, and cleaning;Milk Storage Reason for Pumping: stimulation and supplementation Pumping frequency:  (q 3 hours for 15 min)  Interventions Interventions: Hand express;DEBP;Education;Pace feeding  Consult Status Consult Status: Follow-up Date: 05/20/23 Follow-up type: In-patient    Dahlia Byes G [facility] 05/19/2023, 9:25 AM

## 2023-05-19 NOTE — Progress Notes (Signed)
POSTPARTUM PROGRESS NOTE  Post Partum Day 2  Subjective:  Kelly [name] is a 32 y.o. G2P1011 s/p SVD at [redacted]w[redacted]d.  She reports she is doing well. No acute events overnight. She denies any problems with ambulating, voiding or po intake. No dizziness or unsteadiness. Denies nausea or vomiting.  Pain is well controlled.  Lochia is mild and decreasing, with few small clots.  Objective: Blood pressure 126/80, pulse (!) 58, temperature 98.7 F (37.1 C), temperature source Oral, resp. rate 17, height 5\' 7"  (1.702 m), weight 90.7 kg, last menstrual period 08/15/2022, SpO2 100%, unknown if currently breastfeeding.  Physical Exam:  General: alert, cooperative and no distress Chest: no respiratory distress Heart:regular rate, distal pulses intact Uterine Fundus: firm, appropriately tender. Fundal height 2 cm below umbilicus DVT Evaluation: No calf swelling or tenderness Extremities: no edema Skin: warm, dry  Recent Labs    05/17/23 1314 05/18/23 0458  HGB 11.0* 8.5*  HCT 34.8* 26.7*    Assessment/Plan: Kelly [name] is a 32 y.o. G2P1011 s/p SVD at [redacted]w[redacted]d   PPD#2 - Doing well  Routine postpartum care Anemia: Hgb 8.5 8/18  No severe symptoms and no issues with ambulation  Patient declines blood products  Continue oral iron supplements Contraception: progestin-only pills Feeding: both Dispo: Plan for discharge 8/19.   LOS: 2 days   Hanley Ben, Medical Student OB Fellow  05/19/2023, 7:15 AM

## 2023-05-19 NOTE — Progress Notes (Signed)
CSW received a consult for adjustment disorder, anxiety and depression and met MOB to complete a mental health assessment. CSW entered the room,introduced herself and acknowledged that FOB was present. CSW asked MOB for privacy reasons could FOB stepout for the assessment; MOB was agreeable and the guest stepped out. CSW explained her role and the reason for the assessment. MOB presented as calm, was agreeable to consult and remained engaged throughout encounter.  CSW inquired about MOB's mental health history. MOB reported experiencing anxiety, depression and SI in 2015; due to the passing of her father, and worrying about things she could not control. MOB reported being prescribed Celexa in the past; but only few months for support. MOB reported participating in therapy until 2023; with the [facility] and found the support helpful, and she is still able to utilize coping skills today for support. MOB reported her supports as her mom, stepfather, FOB and FOB's family. MOB reported currently feeling "good" and bonded well with the infant. CSW provided education regarding the baby blues period vs. perinatal mood disorders, discussed treatment and gave resources for mental health follow up if concerns arise.  CSW recommends self-evaluation during the postpartum time period using the New Mom Checklist from Postpartum Progress and encouraged MOB to contact a medical professional if symptoms are noted at any time. CSW assessed for safety with MOB SI/HI/DV;MOB denied all.   CSW asked MOB has she selected a pediatrician for the infant's follow up visits; MOB said [facility]. MOB reported having all essential items for the infant including a carseat, bassinet and crib for safe sleeping. CSW provided review of Sudden Infant Death Syndrome (SIDS) precautions.  CSW identifies no further need for intervention and no barriers to discharge at this time.   Enos Fling, Theresia Majors Clinical Social  Worker [phone]

## 2023-05-19 NOTE — Discharge Summary (Signed)
Postpartum Discharge Summary  Date of Service updated 05/19/23     Patient Name: Kelly [name] DOB: 1990-10-18 MRN: [id number]  Date of admission: 05/17/2023 Delivery date:05/17/2023 Delivering provider: [place] [facility] Date of discharge: 05/19/2023  Admitting diagnosis: Supervision of normal pregnancy in third trimester [Z34.93] Intrauterine pregnancy: [redacted]w[redacted]d     Secondary diagnosis:  Principal Problem:   Supervision of normal pregnancy in third trimester Active Problems:   Blood product declined  Additional problems: none    Discharge diagnosis: Term Pregnancy Delivered                                              Post partum procedures: none Augmentation: AROM Complications: None  Hospital course: Onset of Labor With Vaginal Delivery      32 y.o. yo G2P1011 at [redacted]w[redacted]d was admitted in Active Labor on 05/17/2023. Labor course was uncomplicated.  Membrane Rupture Time/Date: 2:50 PM,05/17/2023  Delivery Method:Vaginal, Spontaneous Operative Delivery:N/A Episiotomy: Right Mediolateral Lacerations:  2nd degree Patient had a postpartum course uncomplicated.  She is ambulating, tolerating a regular diet, passing flatus, and urinating well. Patient is discharged home in stable condition on 05/19/23.  Newborn Data: Birth date:05/17/2023 Birth time:6:37 PM Gender:Female Living status:Living Apgars:9 ,9  Weight:2810 g  Magnesium Sulfate received: No BMZ received: No Rhophylac:N/A MMR:No T-DaP:Given prenatally Flu: N/A Transfusion:No  Physical exam  Vitals:   05/18/23 1032 05/18/23 1336 05/18/23 2046 05/19/23 0527  BP: 131/77 112/72 108/72 126/80  Pulse: 69 62 68 (!) 58  Resp: 17 18 16 17   Temp: 98.6 F (37 C) 97.9 F (36.6 C) 98.3 F (36.8 C) 98.7 F (37.1 C)  TempSrc: Oral Oral Oral Oral  SpO2: 99% 100% 99% 100%  Weight:      Height:       General: alert and no distress Lochia: appropriate Uterine Fundus: firm Incision: N/A DVT Evaluation: No evidence of DVT seen  on physical exam. Negative Homan's sign. Labs: Lab Results  Component Value Date   WBC 12.7 (H) 05/18/2023   HGB 8.5 (L) 05/18/2023   HCT 26.7 (L) 05/18/2023   MCV 83.2 05/18/2023   PLT 223 05/18/2023      Latest Ref Rng & Units 05/17/2023    2:06 PM  CMP  Glucose 70 - 99 mg/dL 87   BUN 6 - 20 mg/dL <5   Creatinine 1.19 - 1.00 mg/dL 1.47   Sodium 829 - 562 mmol/L 137   Potassium 3.5 - 5.1 mmol/L 3.6   Chloride 98 - 111 mmol/L 107   CO2 22 - 32 mmol/L 19   Calcium 8.9 - 10.3 mg/dL 8.4   Total Protein 6.5 - 8.1 g/dL 6.2   Total Bilirubin 0.3 - 1.2 mg/dL 0.7   Alkaline Phos 38 - 126 U/L 170   AST 15 - 41 U/L 18   ALT 0 - 44 U/L 13    Edinburgh Score:    05/18/2023    8:46 PM  Edinburgh Postnatal Depression Scale Screening Tool  I have been able to laugh and see the funny side of things. 0  I have looked forward with enjoyment to things. 0  I have blamed myself unnecessarily when things went wrong. 0  I have been anxious or worried for no good reason. 0  I have felt scared or panicky for no good reason. 0  Things have been getting on top  of me. 0  I have been so unhappy that I have had difficulty sleeping. 0  I have felt sad or miserable. 0  I have been so unhappy that I have been crying. 0  The thought of harming myself has occurred to me. 0  Edinburgh Postnatal Depression Scale Total 0     After visit meds:  Allergies as of 05/19/2023   No Known Allergies      Medication List     TAKE these medications    acetaminophen 325 MG tablet Commonly known as: Tylenol Take 2 tablets (650 mg total) by mouth every 4 (four) hours as needed (for pain scale < 4).   ferrous sulfate 325 (65 FE) MG EC tablet Take 1 tablet (325 mg total) by mouth every other day.   ibuprofen 600 MG tablet Commonly known as: ADVIL Take 1 tablet (600 mg total) by mouth every 6 (six) hours.   multivitamin-prenatal 27-0.8 MG Tabs tablet Take 1 tablet by mouth daily at 12 noon.    norethindrone 0.35 MG tablet Commonly known as: MICRONOR Take 1 tablet (0.35 mg total) by mouth daily.   senna-docusate 8.6-50 MG tablet Commonly known as: Senokot-S Take 2 tablets by mouth at bedtime.         Discharge home in stable condition Infant Feeding: Breast Infant Disposition:home with mother Discharge instruction: per After Visit Summary and Postpartum booklet. Activity: Advance as tolerated. Pelvic rest for 6 weeks.  Diet: routine diet Future Appointments: Future Appointments  Date Time Provider Department Center  05/22/2023  9:55 AM Conan Bowens, MD [facility] None   Follow up Visit:  Follow-up Information     [facility]. Go on 06/19/2023.   Contact information: [address] [place] [address] [phone]                 Please schedule this patient for a In person postpartum visit in 6 weeks with the following provider: Any provider. Additional Postpartum F/U:Postpartum Depression checkup  Low risk pregnancy complicated by:  none Delivery mode:  Vaginal, Spontaneous Anticipated Birth Control:  POPs   05/19/2023 Wyn Forster, MD FMOB Fellow, Faculty practice [facility], Center for [facility]

## 2023-05-20 ENCOUNTER — Encounter: Payer: 59 | Admitting: Obstetrics and Gynecology

## 2023-05-20 ENCOUNTER — Telehealth: Payer: Self-pay

## 2023-05-20 ENCOUNTER — Inpatient Hospital Stay (HOSPITAL_COMMUNITY)
Admission: AD | Admit: 2023-05-20 | Discharge: 2023-05-20 | Disposition: A | Discharge status: Home / Self Care | Payer: 59 | Attending: Family Medicine | Admitting: Family Medicine

## 2023-05-20 ENCOUNTER — Encounter (HOSPITAL_COMMUNITY): Payer: Self-pay | Admitting: Obstetrics and Gynecology

## 2023-05-20 ENCOUNTER — Ambulatory Visit: Payer: Self-pay | Admitting: *Deleted

## 2023-05-20 DIAGNOSIS — N6459 Other signs and symptoms in breast: Secondary | ICD-10-CM | POA: Diagnosis not present

## 2023-05-20 DIAGNOSIS — Z9189 Other specified personal risk factors, not elsewhere classified: Secondary | ICD-10-CM

## 2023-05-20 DIAGNOSIS — O9279 Other disorders of lactation: Secondary | ICD-10-CM | POA: Insufficient documentation

## 2023-05-20 NOTE — MAU Provider Note (Signed)
History     CSN: [id number]  Arrival date and time: 05/20/23 1434   Event Date/Time   First Provider Initiated Contact with Patient 05/20/23 1514      Chief Complaint  Patient presents with   Breast Pain   HPI 32 year old patient currently 3 days postpartum after normal vaginal delivery with acute onset of bilateral breast engorgement.  She reports that she is attempting to nurse at the breast and also pumping.  She has a double electric pump at home.  She also has a manual pump at home.  She reports that she has been unable to pump a significant amount of milk from her breasts.  She does not know if her double electric pump is working.  She noticed starting yesterday and into today that her breast became very large, red, painful.  She reports that her infant is having trouble latching.  She reports there is some nipple flattening that is making latching more difficult.  Last pumped at 1:30 PM  OB History     Gravida  2   Para  1   Term  1   Preterm  0   AB  1   Living  1      SAB  1   IAB  0   Ectopic  0   Multiple  0   Live Births  1           Past Medical History:  Diagnosis Date   ACL tear    Acute medial meniscus tear of left knee 06/20/2020   Acute medial meniscus tear of left knee 06/20/2020   Adjustment disorder with mixed anxiety and depressed mood 04/12/2014   ASCUS with positive high risk HPV cervical 05/13/2014   2015: Pap ASCUS + HR HPV  2015: Colpo CIN 1  2024 Nml   Left anterior cruciate ligament tear 06/20/2020   Vaginal Pap smear, abnormal     Past Surgical History:  Procedure Laterality Date   KNEE ARTHROSCOPY WITH ANTERIOR CRUCIATE LIGAMENT (ACL) REPAIR WITH HAMSTRING GRAFT Left 06/30/2020   Procedure: LEFT KNEE ARTHROSCOPY WITH ANTERIOR CRUCIATE LIGAMENT (ACL) RECONSTRUCTION, PARTIAL MEDIAL MENISCUS REPAIR;  Surgeon: Tarry Kos, MD;  Location: [facility];  Service: Orthopedics;  Laterality: Left;    Family  History  Problem Relation Age of Onset   Hypertension Mother    Diabetes Father    Hypertension Brother    Breast cancer Maternal Grandmother 34   Pancreatic cancer Maternal Grandmother     Social History   Tobacco Use   Smoking status: Never   Smokeless tobacco: Never  Vaping Use   Vaping status: Never Used  Substance Use Topics   Alcohol use: Not Currently    Comment: occas, prior to pregnancy   Drug use: Never    Allergies: No Known Allergies  Medications Prior to Admission  Medication Sig Dispense Refill Last Dose   Prenatal Vit-Fe Fumarate-FA (MULTIVITAMIN-PRENATAL) 27-0.8 MG TABS tablet Take 1 tablet by mouth daily at 12 noon.   05/20/2023   acetaminophen (TYLENOL) 325 MG tablet Take 2 tablets (650 mg total) by mouth every 4 (four) hours as needed (for pain scale < 4). 100 tablet 1    ferrous sulfate 325 (65 FE) MG EC tablet Take 1 tablet (325 mg total) by mouth every other day. 45 tablet 1    ibuprofen (ADVIL) 600 MG tablet Take 1 tablet (600 mg total) by mouth every 6 (six) hours. 100 tablet 1  norethindrone (MICRONOR) 0.35 MG tablet Take 1 tablet (0.35 mg total) by mouth daily. 90 tablet 3    senna-docusate (SENOKOT-S) 8.6-50 MG tablet Take 2 tablets by mouth at bedtime. 60 tablet 1     Review of Systems  Constitutional:  Negative for chills and fever.  HENT:  Negative for congestion and sore throat.   Eyes:  Negative for pain and visual disturbance.  Respiratory:  Negative for cough, chest tightness and shortness of breath.   Cardiovascular:  Negative for chest pain.  Gastrointestinal:  Negative for abdominal pain, diarrhea, nausea and vomiting.  Endocrine: Negative for cold intolerance and heat intolerance.  Genitourinary:  Negative for dysuria and flank pain.  Musculoskeletal:  Negative for back pain.  Skin:  Negative for rash.  Allergic/Immunologic: Negative for food allergies.  Neurological:  Negative for dizziness and light-headedness.   Psychiatric/Behavioral:  Negative for agitation.    Physical Exam   Blood pressure 114/81, pulse 80, temperature 98.7 F (37.1 C), temperature source Oral, resp. rate 17, height 5\' 7"  (1.702 m), weight 88.6 kg, SpO2 100%, currently breastfeeding.  Physical Exam Vitals and nursing note reviewed.  Constitutional:      General: She is not in acute distress.    Appearance: She is well-developed.  HENT:     Head: Normocephalic and atraumatic.  Eyes:     General: No scleral icterus.    Conjunctiva/sclera: Conjunctivae normal.  Cardiovascular:     Rate and Rhythm: Normal rate.  Pulmonary:     Effort: Pulmonary effort is normal.  Chest:     Chest wall: No tenderness.     Comments: Bilaterally engorged and pendulous breasts. When palpating the breast they are leaking milk. Has enlarged ducts into bilateral axilla.  Abdominal:     Palpations: Abdomen is soft.     Tenderness: There is no abdominal tenderness. There is no guarding or rebound.  Genitourinary:    Vagina: Normal.  Musculoskeletal:        General: Normal range of motion.     Cervical back: Normal range of motion and neck supple.  Skin:    General: Skin is warm and dry.     Findings: No rash.  Neurological:     Mental Status: She is alert and oriented to person, place, and time.    Patient was able to pump 7 oz combined volume in MAU MAU Course  Procedures  MDM- moderate - Lactation consult - Unlikely mastitis as no focal findings   Assessment and Plan   1. Breast engorgement   2. Breastfeeding problem    - Lactation was consulted and help with use of hospital grade pump - Encouraged renting pump - I set up outpatient follow up with [facility] on 8/22 - Reviewed draining breast every 2-3 hours - recommended use of ice to breasts  Future Appointments  Date Time Provider Department Center  07/01/2023  3:10 PM Lennart Pall, MD [facility] None     Isa Rankin [facility] 05/20/2023, 5:18 PM

## 2023-05-20 NOTE — Progress Notes (Signed)
Lactation notified regarding pt need for consult.

## 2023-05-20 NOTE — Telephone Encounter (Signed)
Summary: engorged breast   Per agent:  "Recently released from hospital (post partum) was given advice but was told because she does not have a fever she does not have infection. Ms Kelly [name] states her breast are so engorged that her nipples are not visible and they are bleeding and she can not feed the baby."        Chief Complaint: Engorged breasts. Symptoms: Breasts red, warm to touch, hard. Is pumping but "Come back even more engorged."  States pumped about 1/2 ounce at 1030.Marland Kitchen "Looks like blood in breastmilk." Nipples cracked. 10/10 pain, chills Frequency: 0300 this AM Pertinent Negatives: Patient denies  Disposition: [x] ED /[] Urgent Care (no appt availability in office) / [] Appointment(In office/virtual)/ []  [place] [facility]/ [] Home Care/ [] Refused Recommended Disposition /[]  Mobile Bus/ []  Follow-up with PCP Additional Notes: Advised ED, [facility]. States will follow disposition.   Reason for Disposition  Patient sounds very sick or weak to the triager  Answer Assessment - Initial Assessment Questions 1. SYMPTOM: "What's the main symptom you're concerned about?" (e.g., pain, redness, swelling)     Engorged. 2. ONSET: "When did the  start?"     This AM  0300 3. LOCATION: "Which breast?" (e.g., left, right, both) "Is the pain in the breast or just the nipple?"     Both breasts 4. PAIN: "How bad is the pain?"  (Scale 1-10; or mild, moderate, severe)   - MILD: doesn't interfere with normal activities    - MODERATE: interferes with normal activities or awakens from sleep    - SEVERE: excruciating pain, unable to do any normal activities      10/10 5. REDNESS: "Does the skin appear red? Does the breast feel hot to touch?"      Red, hot to touch 6. LUMP OR SWELLING: "Does the breast feel hard or have lumps?"     Feel hard 7. DELIVERY DATE: "When was your delivery date?" "Vaginal delivery or C-section?"     05/17/23.  vaginal 8. BREASTFEEDING AND PUMPING: "Did  you breastfeed your baby or use a breast pump after delivery?" If Yes, ask: "When did you last breastfeed or pump your breasts?"     Pumped at 103, got less than one ounce 9. FEVER: "Do you have a fever?" If Yes, ask: "What is it, how was it measured, and when did it start?"     No Chills 10. OTHER SYMPTOMS: "Do you have any other symptoms?" (e.g., feeling sad or depressed, nipple symptoms)       Nipples bleeding "Cracked"  Protocols used: Postpartum - Breast Pain and Engorgement-A-AH

## 2023-05-20 NOTE — Lactation Note (Addendum)
Lactation Consultation Note  Patient Name: Kelly [name] [id number] Date: 05/20/2023 Age:32 y.o. Reason for consult: Initial assessment;Primapara;1st time breastfeeding;Engorgement;MD order  LC requested to consult in MAU for a primigravida 3 days post partum with severe engorgement.  Mom states she has been doing some breastfeeding, but with her engorgement, baby wasn't latching.  Mom was feeding baby formula by bottle.  LC noted that baby was bottle feeding on second day of hospital stay (yesterday).  Breasts are engorged and painful.   LC placed ice packs on both breasts with HOB flat, for 20 mins. LC set up DEBP and assisted with pumping using 21 mm flanges after icing.   Mom expressed 210 ml and breasts much softer.  Talked to Mom about what her plan is with feeding.  She is open to pumping and bottle feeding and breastfeeding.  Mom states she was provided with a Medela DEBP when in hospital.  She states the pump isn't working well.  Last time she pumped, she expressed 30 ml.   MD made an appointment for Mom on 8/[facility].  Talked to Mom in detail regarding the importance of consistent pumping if baby isn't at the breast and feeding well. Any time that baby is supplemented by bottle, LC recommends double pumping for 20-30 min.  Mom states she is going to rent a Firefighter from gift shop for 2 weeks as she is not interested in becoming engorged again.   Mom instructed to remove all the pump parts and place in bag provided to take home for her rental pump.  Plan recommended- 1- STS with baby, offering the breast often with cues. 2-If baby is fed a bottle, Mom will double pump 20-30 mins 3-F/U with OP lactation appointment at [facility]   Lactation Tools Discussed/Used Tools: Pump;Flanges;Bottle Flange Size: 21 Breast pump type: Double-Electric Breast Pump Pump Education: Setup, frequency, and cleaning;Milk Storage Reason for Pumping: Support milk  supply/breast engorgement Pumping frequency: Mom encouraged to pump after breastfeeding and whenever baby is supplemented with a bottle Pumped volume: 210 mL (7 oz)  Interventions Interventions: Breast feeding basics reviewed;DEBP;Ice;Education  Discharge Discharge Education: Engorgement and breast care;Outpatient recommendation (MD made an appointment [facility] for 8/22) Pump: Refer for rental [facility]: No  Consult Status Consult Status: Complete    Judee Clara 05/20/2023, 5:04 PM

## 2023-05-20 NOTE — Telephone Encounter (Signed)
Pt called with c/o intense engorgement of both breast. Pt requesting antibiotic, informed pt antibiotics are not given for engorgement but for mastitis. Pt denies fever. Pt encouraged to try to manually express, pump, utilize cold compress, and take Ibuprofen for relief. Lactation numbers provided.

## 2023-05-20 NOTE — MAU Note (Signed)
Kelly [name] is a 32 y.o. at here in MAU reporting: delivered 8/17.  Was able to pump 2 oz, breasts are leaking. But not going in to the pump.  Breasts are hard, she doesn't know what to do, baby can't latch.   Onset of complaint: 0200, pumped an once prior Pain score: 10 Vitals:   05/20/23 1456  BP: 114/81  Pulse: 80  Resp: 17  Temp: 98.7 F (37.1 C)  SpO2: 100%      Lab orders placed from triage:     Needs lactation

## 2023-05-21 ENCOUNTER — Other Ambulatory Visit: Payer: Self-pay | Admitting: *Deleted

## 2023-05-22 ENCOUNTER — Encounter: Payer: 59 | Admitting: Obstetrics and Gynecology

## 2023-05-29 ENCOUNTER — Inpatient Hospital Stay (HOSPITAL_COMMUNITY): Payer: 59

## 2023-05-29 ENCOUNTER — Inpatient Hospital Stay (HOSPITAL_COMMUNITY): Admission: RE | Admit: 2023-05-29 | Payer: 59 | Source: Home / Self Care | Admitting: Obstetrics & Gynecology

## 2023-06-16 ENCOUNTER — Telehealth (HOSPITAL_COMMUNITY): Payer: Self-pay | Admitting: *Deleted

## 2023-06-16 NOTE — Telephone Encounter (Signed)
06/16/2023  Name: Kelly [name] MRN: [id number] DOB: [birth year]  Reason for Call:  Transition of Care Hospital Discharge Call  Contact Status: Patient Contact Status: Complete  Language assistant needed: Interpreter Mode: Interpreter Not Needed        Follow-Up Questions: Do You Have Any Concerns About Your Health As You Heal From Delivery?: No Do You Have Any Concerns About Your Infants Health?: No  Edinburgh Postnatal Depression Scale:  In the Past 7 Days:   Patient requested call back in an hour to complete screening due to baby being fussy and ready for a feeding.   PHQ2-9 Depression Scale:     Discharge Follow-up:    Post-discharge interventions: NA  Salena Saner, RN 06/16/2023 15:06

## 2023-06-17 ENCOUNTER — Telehealth (HOSPITAL_COMMUNITY): Payer: Self-pay | Admitting: *Deleted

## 2023-06-17 NOTE — Telephone Encounter (Signed)
06/17/2023  Name: Kelly [name] MRN: [id number] DOB: 12-04-1990  Reason for Call:  Transition of Care Hospital Discharge Call  Contact Status: Patient Contact Status: Complete  Language assistant needed:          Follow-Up Questions:    Inocente Salles Postnatal Depression Scale:  In the Past 7 Days:   See below.  Since scoring high, patient has had in-home support from her mother-in-law and from her mother.  She said their support has allowed her to get sleep and to improve her nutritional intake, neither of which were going well in the first couple of weeks after delivery.  She said she is feeling emotionally well at this time.  PHQ2-9 Depression Scale:     Discharge Follow-up: Edinburgh score requires follow up?:  (Declined screening today, has completed twice with [facility] nurse in last two weeks with score going from 19 down to 9 last Friday, September 13.) Patient was advised of the following resources:: Breastfeeding Support Group, Support Group  Post-discharge interventions: Reviewed Newborn Safe Sleep Practices  Salena Saner, RN 06/17/2023 10:33

## 2023-06-17 NOTE — Telephone Encounter (Signed)
06/17/2023  Name: Naquisha Hoffman MRN: [id number] DOB: [birth year]  Reason for Call:  Transition of Care Hospital Discharge Call  Contact Status: Patient Contact Status: Message  Language assistant needed:          Follow-Up Questions:    Inocente Salles Postnatal Depression Scale:  In the Past 7 Days:   Attempted to complete call started yesterday.    PHQ2-9 Depression Scale:     Discharge Follow-up:    Post-discharge interventions: NA  Salena Saner, RN 06/17/2023 10:19

## 2023-07-01 ENCOUNTER — Ambulatory Visit: Payer: 59 | Admitting: Obstetrics and Gynecology

## 2023-07-01 MED ORDER — NORETHINDRONE 0.35 MG PO TABS: 1.0000 | ORAL_TABLET | Freq: Every day | ORAL | 3 refills | Status: DC

## 2023-07-01 NOTE — Progress Notes (Unsigned)
Post Partum Visit Note  Kelly [name] is a 32 y.o. G38P1011 female who presents for a postpartum visit. She is 7 weeks postpartum following a normal spontaneous vaginal delivery.  I have fully reviewed the prenatal and intrapartum course. The delivery was at 39.2 gestational weeks.  Anesthesia: epidural. Postpartum course has been uncomplicated. Baby is doing well. Baby is feeding by breast. Bleeding no bleeding. Bowel function is normal. Bladder function is normal. Patient is not sexually active. Contraception method is abstinence. Pt would like to discuss BC options.  Postpartum depression screening: negative, score 1.   The pregnancy intention screening data noted above was reviewed. Potential methods of contraception were discussed. The patient elected to proceed with Oral Contraceptive.   Edinburgh Postnatal Depression Scale - 07/01/23 1508       Edinburgh Postnatal Depression Scale:  In the Past 7 Days   I have been able to laugh and see the funny side of things. 0    I have looked forward with enjoyment to things. 0    I have blamed myself unnecessarily when things went wrong. 0    I have been anxious or worried for no good reason. 0    I have felt scared or panicky for no good reason. 1    Things have been getting on top of me. 0    I have been so unhappy that I have had difficulty sleeping. 0    I have felt sad or miserable. 0    I have been so unhappy that I have been crying. 0    The thought of harming myself has occurred to me. 0    Edinburgh Postnatal Depression Scale Total 1             Health Maintenance Due  Topic Date Due   DTaP/Tdap/Td (1 - Tdap) Never done   INFLUENZA VACCINE  05/01/2023   COVID-19 Vaccine (3 - 2023-24 season) 06/01/2023    {Common ambulatory SmartLinks:19316}  Review of Systems {ros; complete:30496}  Objective:  BP 117/67   Pulse 73   Wt 170 lb (77.1 kg)   Breastfeeding Yes   BMI 26.63 kg/m    General:  {gen  appearance:16600}   Breasts:  {desc; normal/abnormal/not indicated:14647}  Lungs: {lung exam:16931}  Heart:  {heart exam:5510}  Abdomen: {abdomen exam:16834}   Wound {Wound assessment:11097}  GU exam:  {desc; normal/abnormal/not indicated:14647}       Assessment:    There are no diagnoses linked to this encounter.  *** postpartum exam.   Plan:   Essential components of care per ACOG recommendations:  1.  Mood and well being: Patient with negative depression screening today. Reviewed local resources for support.  - Patient tobacco use? No.   - hx of drug use? No.    2. Infant care and feeding:  -Patient currently breastmilk feeding? Yes. Reviewed importance of draining breast regularly to support lactation.  -Social determinants of health (SDOH) reviewed in EPIC. No concerns  3. Sexuality, contraception and birth spacing - Patient does not want a pregnancy in the next year.  Desired family size is undecided - Reviewed reproductive life planning. Reviewed contraceptive methods based on pt preferences and effectiveness.  Patient desired Oral Contraceptive today.   - Discussed birth spacing of 18 months  4. Sleep and fatigue -Encouraged family/partner/community support of 4 hrs of uninterrupted sleep to help with mood and fatigue  5. Physical Recovery  - Discussed patients delivery and complications. She describes her labor as  mixed. - Patient had a  vaginal delivery . Patient had a 2nd degree laceration. Perineal healing reviewed. Patient expressed understanding - Patient has urinary incontinence? No. - Patient is safe to resume physical and sexual activity  6.  Health Maintenance - HM due items addressed Yes - Last pap smear  Diagnosis  Date Value Ref Range Status  11/13/2022   Final   - Negative for intraepithelial lesion or malignancy (NILM)   Pap smear not done at today's visit.  -Breast Cancer screening indicated? No.   7. Chronic Disease/Pregnancy Condition follow  up: None  Lennart Pall, MD Center for [employer], [facility]

## 2023-09-11 ENCOUNTER — Ambulatory Visit: Payer: 59 | Admitting: Obstetrics and Gynecology

## 2023-10-01 NOTE — L&D Delivery Note (Signed)
 Delivery Note Rosezetta Balderston is a 33 y.o. [id number] at [redacted]w[redacted]d admitted for active labor.   GBS Status:  Negative/-- (10/02 1630)  Labor course: Initial SVE: 10/10/+1. Augmentation with: AROM at delivery.  ROM: 0h 67m with clear fluid  Birth: After a brief 2nd stage, she delivered a Live born female  Birth Weight: 8 lb 0.4 oz (3640 g) APGAR: 8, 9  Newborn Delivery   Birth date/time: 07/23/2022 00:55:06 Delivery type: Vaginal, Spontaneous        Delivered via spontaneous vaginal delivery (Presentation: OA ). Nuchal cord present: No. . Shoulders and body delivered in usual fashion. Infant placed directly on mom's abdomen for bonding/skin-to-skin, baby dried and stimulated. Cord clamped x 2 after 1 minute and cut by FOB.  Cord blood collected. Placenta delivered-Spontaneous with 3 vessels. Was unable to obtain IV access prior to delivery, so  10u pitocin  given IM prior to  delivery of placenta.  Fundus firm with massage. However, she had periods of bogginess and would spurt blood. MEthergine 0.2mg  IM, cytotec 800 mcg PR, LUS sweeps and bimanual massage.  At this point, IV access was obtained and 20u Pitocin  in 500cc LR given as a bolus.  Dr. Izell asked to come and assumed care.  Please see his notes for details. Placenta inspected and appears to be intact with a 3 VC.  Sponge and instrument count were correct x2.    Intrapartum complications:  Postpartum Hemorrhage Anesthesia:  none Lacerations:  1st degree Suture Repair:  EBL (mL):1,104.00 in delivery room, total ~ 2200cc    Mom to postpartum.  Baby to Couplet care / Skin to Skin. Placenta to L&D   Plans to Breast and bottlefeed Contraception: unsure Circumcision: wants inpatient  Note sent to [facility]: [place] for pp visit.  Delivery Report:  Review the Delivery Report for details.     Signed: Cathlean Ely, DNP,CNM 07/23/2024, 4:05 AM

## 2023-12-03 ENCOUNTER — Inpatient Hospital Stay (HOSPITAL_COMMUNITY)
Admission: AD | Admit: 2023-12-03 | Discharge: 2023-12-04 | Disposition: A | Discharge status: Home / Self Care | Attending: Obstetrics and Gynecology | Admitting: Obstetrics and Gynecology

## 2023-12-03 DIAGNOSIS — O208 Other hemorrhage in early pregnancy: Secondary | ICD-10-CM | POA: Diagnosis present

## 2023-12-03 DIAGNOSIS — O418X10 Other specified disorders of amniotic fluid and membranes, first trimester, not applicable or unspecified: Secondary | ICD-10-CM | POA: Diagnosis not present

## 2023-12-03 DIAGNOSIS — Z3A01 Less than 8 weeks gestation of pregnancy: Secondary | ICD-10-CM | POA: Diagnosis not present

## 2023-12-03 DIAGNOSIS — O26891 Other specified pregnancy related conditions, first trimester: Secondary | ICD-10-CM | POA: Diagnosis not present

## 2023-12-03 DIAGNOSIS — R102 Pelvic and perineal pain: Secondary | ICD-10-CM | POA: Diagnosis not present

## 2023-12-03 DIAGNOSIS — O3680X0 Pregnancy with inconclusive fetal viability, not applicable or unspecified: Secondary | ICD-10-CM | POA: Diagnosis not present

## 2023-12-03 DIAGNOSIS — O209 Hemorrhage in early pregnancy, unspecified: Secondary | ICD-10-CM

## 2023-12-03 LAB — POCT PREGNANCY, URINE: Preg Test, Ur: POSITIVE — AB

## 2023-12-04 ENCOUNTER — Inpatient Hospital Stay (HOSPITAL_COMMUNITY)

## 2023-12-04 ENCOUNTER — Encounter (HOSPITAL_COMMUNITY): Payer: Self-pay | Admitting: Obstetrics and Gynecology

## 2023-12-04 DIAGNOSIS — O208 Other hemorrhage in early pregnancy: Secondary | ICD-10-CM | POA: Diagnosis not present

## 2023-12-04 DIAGNOSIS — O3680X0 Pregnancy with inconclusive fetal viability, not applicable or unspecified: Secondary | ICD-10-CM | POA: Diagnosis not present

## 2023-12-04 DIAGNOSIS — O26891 Other specified pregnancy related conditions, first trimester: Secondary | ICD-10-CM

## 2023-12-04 DIAGNOSIS — O418X10 Other specified disorders of amniotic fluid and membranes, first trimester, not applicable or unspecified: Secondary | ICD-10-CM

## 2023-12-04 DIAGNOSIS — Z3A01 Less than 8 weeks gestation of pregnancy: Secondary | ICD-10-CM | POA: Diagnosis not present

## 2023-12-04 DIAGNOSIS — R102 Pelvic and perineal pain: Secondary | ICD-10-CM

## 2023-12-04 DIAGNOSIS — O468X1 Other antepartum hemorrhage, first trimester: Secondary | ICD-10-CM

## 2023-12-04 LAB — WET PREP, GENITAL
Clue Cells Wet Prep HPF POC: NONE SEEN
Sperm: NONE SEEN
Trich, Wet Prep: NONE SEEN
WBC, Wet Prep HPF POC: >=10 — AB (ref <10)
Yeast Wet Prep HPF POC: NONE SEEN

## 2023-12-04 LAB — COMPREHENSIVE METABOLIC PANEL
ALT: 11 U/L (ref 0–44)
AST: 15 U/L (ref 15–41)
Albumin: 3.8 g/dL (ref 3.5–5.0)
Alkaline Phosphatase: 47 U/L (ref 38–126)
Anion gap: 10 (ref 5–15)
BUN: 14 mg/dL (ref 6–20)
CO2: 22 mmol/L (ref 22–32)
Calcium: 9.3 mg/dL (ref 8.9–10.3)
Chloride: 106 mmol/L (ref 98–111)
Creatinine, Ser: 0.82 mg/dL (ref 0.44–1.00)
GFR, Estimated: >60 mL/min (ref >60)
Glucose, Bld: 97 mg/dL (ref 70–99)
Potassium: 3.7 mmol/L (ref 3.5–5.1)
Sodium: 138 mmol/L (ref 135–145)
Total Bilirubin: 0.7 mg/dL (ref 0.0–1.2)
Total Protein: 6.8 g/dL (ref 6.5–8.1)

## 2023-12-04 LAB — CBC
HCT: 33.3 % — ABNORMAL LOW (ref 36.0–46.0)
Hemoglobin: 10.9 g/dL — ABNORMAL LOW (ref 12.0–15.0)
MCH: 27.2 pg (ref 26.0–34.0)
MCHC: 32.7 g/dL (ref 30.0–36.0)
MCV: 83 fL (ref 80.0–100.0)
Platelets: 299 10*3/uL (ref 150–400)
RBC: 4.01 MIL/uL (ref 3.87–5.11)
RDW: 12.6 % (ref 11.5–15.5)
WBC: 5.7 10*3/uL (ref 4.0–10.5)
nRBC: 0 % (ref 0.0–0.2)

## 2023-12-04 LAB — HIV ANTIBODY (ROUTINE TESTING W REFLEX): HIV Screen 4th Generation wRfx: NONREACTIVE

## 2023-12-04 LAB — HCG, QUANTITATIVE, PREGNANCY: hCG, Beta Chain, Quant, S: 21732 m[IU]/mL — ABNORMAL HIGH (ref <5)

## 2023-12-04 LAB — GC/CHLAMYDIA PROBE AMP (~~LOC~~) NOT AT ARMC
Chlamydia: NEGATIVE
Comment: NEGATIVE
Comment: NORMAL
Neisseria Gonorrhea: NEGATIVE

## 2023-12-04 NOTE — MAU Note (Signed)
.  Kelly [name] is a 33 y.o. at Unknown here in MAU reporting: is PP 8/24, is breastfeeding. Reports has not had menstrual period. Reports had 1 episode of VB on Monday that could have been a period but unsure. Took home UPT at 2100 12/03/23 was (+). Patient also reports spotting today, does not need to wear a pad only reports when she wipes has pink on toilet paper.  Reports lower back pain and lower abdominal pain that is intermittent, reports it is sharp and radiates from her lower back to lower abd area. Has not taken any pain medication at home   LMP: N/A Onset of complaint: Monday  Pain score: 7/10 There were no vitals filed for this visit.   FHT: n/a   Lab orders placed from triage: n/a

## 2023-12-04 NOTE — MAU Provider Note (Signed)
 Chief Complaint: Possible Pregnancy   Event Date/Time   First Provider Initiated Contact with Patient 12/04/23 0222        SUBJECTIVE HPI: Kelly [name] is a 33 y.o. G3P1011 at [redacted]w[redacted]d by LMP who is 6 months postpartum, presents to maternity admissions reporting intermittent cramping and one episode of light bleeding. . She denies vaginal itching/burning, urinary symptoms, n/v, or fever/chills.    Possible Pregnancy This is a new problem. Associated symptoms include abdominal pain. Pertinent negatives include no chills, fever, nausea or vomiting.   RN Note: Kelly [name] is a 33 y.o. at Unknown here in MAU reporting: is PP 8/24, is breastfeeding. Reports has not had menstrual period. Reports had 1 episode of VB on Monday that could have been a period but unsure. Took home UPT at 2100 12/03/23 was (+). Patient also reports spotting today, does not need to wear a pad only reports when she wipes has pink on toilet paper.  Reports lower back pain and lower abdominal pain that is intermittent, reports it is sharp and radiates from her lower back to lower abd area. Has not taken any pain medication at home   Past Medical History:  Diagnosis Date   ACL tear    Acute medial meniscus tear of left knee 06/20/2020   Acute medial meniscus tear of left knee 06/20/2020   Adjustment disorder with mixed anxiety and depressed mood 04/12/2014   ASCUS with positive high risk HPV cervical 05/13/2014   2015: Pap ASCUS + HR HPV  2015: Colpo CIN 1  2024 Nml   Left anterior cruciate ligament tear 06/20/2020   Vaginal Pap smear, abnormal    Past Surgical History:  Procedure Laterality Date   KNEE ARTHROSCOPY WITH ANTERIOR CRUCIATE LIGAMENT (ACL) REPAIR WITH HAMSTRING GRAFT Left 06/30/2020   Procedure: LEFT KNEE ARTHROSCOPY WITH ANTERIOR CRUCIATE LIGAMENT (ACL) RECONSTRUCTION, PARTIAL MEDIAL MENISCUS REPAIR;  Surgeon: Tarry Kos, MD;  Location: [facility];  Service: Orthopedics;  Laterality:  Left;   Social History   Socioeconomic History   Marital status: Married    Spouse name: Not on file   Number of children: Not on file   Years of education: Not on file   Highest education level: Not on file  Occupational History   Not on file  Tobacco Use   Smoking status: Never   Smokeless tobacco: Never  Vaping Use   Vaping status: Never Used  Substance and Sexual Activity   Alcohol use: Not Currently    Comment: occas, prior to pregnancy   Drug use: Never   Sexual activity: Not Currently    Partners: Male    Birth control/protection: None    Comment: pt reports IC this past week  Other Topics Concern   Not on file  Social History Narrative   ** Merged History Encounter **       Social Drivers of Health   Financial Resource Strain: Not on file  Food Insecurity: No Food Insecurity (05/17/2023)   Hunger Vital Sign    Worried About Running Out of Food in the Last Year: Never true    Ran Out of Food in the Last Year: Never true  Transportation Needs: No Transportation Needs (05/17/2023)   PRAPARE - Administrator, Civil Service (Medical): No    Lack of Transportation (Non-Medical): No  Physical Activity: Not on file  Stress: Not on file  Social Connections: Unknown (02/10/2022)   Received from [facility], [facility]   Social  Network    Social Network: Not on file  Intimate Partner Violence: Not At Risk (05/17/2023)   Humiliation, Afraid, Rape, and Kick questionnaire    Fear of Current or Ex-Partner: No    Emotionally Abused: No    Physically Abused: No    Sexually Abused: No   No current facility-administered medications on file prior to encounter.   Current Outpatient Medications on File Prior to Encounter  Medication Sig Dispense Refill   norethindrone (MICRONOR) 0.35 MG tablet Take 1 tablet (0.35 mg total) by mouth daily. 90 tablet 3   [DISCONTINUED] promethazine (PHENERGAN) 25 MG tablet Take 1 tablet (25 mg total) by mouth every 6 (six)  hours as needed for nausea. 30 tablet 1   No Known Allergies  I have reviewed patient's Past Medical Hx, Surgical Hx, Family Hx, Social Hx, medications and allergies.   ROS:  Review of Systems  Constitutional:  Negative for chills and fever.  Gastrointestinal:  Positive for abdominal pain. Negative for nausea and vomiting.   Review of Systems  Other systems negative   Physical Exam  Physical Exam Patient Vitals for the past 24 hrs:  BP Pulse SpO2 Height Weight  12/04/23 0059 120/71 86 100 % 5\' 7"  (1.702 m) 77.1 kg   Constitutional: Well-developed, well-nourished female in no acute distress.  Cardiovascular: normal rate Respiratory: normal effort GI: Abd soft, non-tender.  MS: Extremities nontender, no edema, normal ROM Neurologic: Alert and oriented x 4.  GU: Neg CVAT.  PELVIC EXAM: deferred  LAB RESULTS Results for orders placed or performed during the hospital encounter of 12/03/23 (from the past 24 hours)  Pregnancy, urine POC     Status: Abnormal   Collection Time: 12/03/23 11:34 PM  Result Value Ref Range   Preg Test, Ur POSITIVE (A) NEGATIVE  CBC     Status: Abnormal   Collection Time: 12/04/23  1:26 AM  Result Value Ref Range   WBC 5.7 4.0 - 10.5 K/uL   RBC 4.01 3.87 - 5.11 MIL/uL   Hemoglobin 10.9 (L) 12.0 - 15.0 g/dL   HCT 16.1 (L) 09.6 - 04.5 %   MCV 83.0 80.0 - 100.0 fL   MCH 27.2 26.0 - 34.0 pg   MCHC 32.7 30.0 - 36.0 g/dL   RDW 40.9 81.1 - 91.4 %   Platelets 299 150 - 400 K/uL   nRBC 0.0 0.0 - 0.2 %  hCG, quantitative, pregnancy     Status: Abnormal   Collection Time: 12/04/23  1:26 AM  Result Value Ref Range   hCG, Beta Chain, Quant, S 21,732 (H) <5 mIU/mL  HIV Antibody (routine testing w rflx)     Status: None   Collection Time: 12/04/23  1:26 AM  Result Value Ref Range   HIV Screen 4th Generation wRfx Non Reactive Non Reactive  Comprehensive metabolic panel     Status: None   Collection Time: 12/04/23  1:26 AM  Result Value Ref Range    Sodium 138 135 - 145 mmol/L   Potassium 3.7 3.5 - 5.1 mmol/L   Chloride 106 98 - 111 mmol/L   CO2 22 22 - 32 mmol/L   Glucose, Bld 97 70 - 99 mg/dL   BUN 14 6 - 20 mg/dL   Creatinine, Ser 7.82 0.44 - 1.00 mg/dL   Calcium 9.3 8.9 - 95.6 mg/dL   Total Protein 6.8 6.5 - 8.1 g/dL   Albumin 3.8 3.5 - 5.0 g/dL   AST 15 15 - 41 U/L  ALT 11 0 - 44 U/L   Alkaline Phosphatase 47 38 - 126 U/L   Total Bilirubin 0.7 0.0 - 1.2 mg/dL   GFR, Estimated >40 >98 mL/min   Anion gap 10 5 - 15  Wet prep, genital     Status: Abnormal   Collection Time: 12/04/23  1:34 AM   Specimen: Vaginal  Result Value Ref Range   Yeast Wet Prep HPF POC NONE SEEN NONE SEEN   Trich, Wet Prep NONE SEEN NONE SEEN   Clue Cells Wet Prep HPF POC NONE SEEN NONE SEEN   WBC, Wet Prep HPF POC >=10 (A) <10   Sperm NONE SEEN      IMAGING US OB LESS THAN 14 WEEKS WITH OB TRANSVAGINAL Result Date: 12/04/2023 CLINICAL DATA:  Bleeding in early pregnancy, initial encounter EXAM: OBSTETRIC <14 WK [place] AND TRANSVAGINAL OB US TECHNIQUE: Both transabdominal and transvaginal ultrasound examinations were performed for complete evaluation of the gestation as well as the maternal uterus, adnexal regions, and pelvic cul-de-sac. Transvaginal technique was performed to assess early pregnancy. COMPARISON:  None Available. FINDINGS: Intrauterine gestational sac: Present Yolk sac:  Present Embryo:  Present Cardiac Activity: Present Heart Rate: 116 bpm CRL:  5.2 mm   6 w   1 d                  [place] EDC: 07/28/2024 Subchorionic hemorrhage: Small subchorionic hemorrhage is noted measuring up to 5 mm. Maternal uterus/adnexae: Ovaries are within normal limits. IMPRESSION: Single live intrauterine gestation at 6 weeks 1 day. Small subchorionic hemorrhage is noted. Electronically Signed   By: Alcide Clever M.D.   On: 12/04/2023 01:58    MAU Management/MDM: I have reviewed the triage vital signs and the nursing notes.   Pertinent labs & imaging results that were  available during my care of the patient were reviewed by me and considered in my medical decision making (see chart for details).      I have reviewed her medical records including past results, notes and treatments. Medical, Surgical, and family history were reviewed.  Medications and recent lab tests were reviewed  Ordered usual first trimester r/o ectopic labs.   Pelvic cultures done Will check baseline Ultrasound to rule out ectopic.  This bleeding/pain can represent a normal pregnancy with bleeding, spontaneous abortion or even an ectopic which can be life-threatening.  The process as listed above helps to determine which of these is present.  Reviewed findings.  Patient is pleased Discussed needs to call office to arrange prenatal care  ASSESSMENT 1. Bleeding in early pregnancy   2. [redacted] weeks gestation of pregnancy   3. Pelvic pain affecting pregnancy in first trimester, antepartum   4. Subchorionic hematoma in first trimester, single or unspecified fetus   5. Pregnancy of unknown anatomic location     PLAN Discharge home Encouraged to start prenatal care   Follow-up Information     Center for [facility] Follow up.   Specialty: Obstetrics and Gynecology Contact information: [address] [place] [address] [phone]               Pt stable at time of discharge. Encouraged to return here if she develops worsening of symptoms, increase in pain, fever, or other concerning symptoms.    Wynelle Bourgeois CNM, MSN Certified Nurse-Midwife 12/04/2023  2:22 AM

## 2023-12-07 ENCOUNTER — Encounter (HOSPITAL_COMMUNITY): Payer: Self-pay | Admitting: Obstetrics & Gynecology

## 2023-12-07 ENCOUNTER — Inpatient Hospital Stay (HOSPITAL_COMMUNITY)

## 2023-12-07 ENCOUNTER — Inpatient Hospital Stay (HOSPITAL_COMMUNITY)
Admission: AD | Admit: 2023-12-07 | Discharge: 2023-12-07 | Disposition: A | Discharge status: Home / Self Care | Payer: Self-pay | Attending: Obstetrics & Gynecology | Admitting: Obstetrics & Gynecology

## 2023-12-07 ENCOUNTER — Other Ambulatory Visit: Payer: Self-pay

## 2023-12-07 DIAGNOSIS — O208 Other hemorrhage in early pregnancy: Secondary | ICD-10-CM | POA: Diagnosis not present

## 2023-12-07 DIAGNOSIS — O209 Hemorrhage in early pregnancy, unspecified: Secondary | ICD-10-CM

## 2023-12-07 DIAGNOSIS — Z3A01 Less than 8 weeks gestation of pregnancy: Secondary | ICD-10-CM

## 2023-12-07 DIAGNOSIS — O468X1 Other antepartum hemorrhage, first trimester: Secondary | ICD-10-CM

## 2023-12-07 LAB — TYPE AND SCREEN
ABO/RH(D): A POS
Antibody Screen: NEGATIVE

## 2023-12-07 LAB — CBC
HCT: 34.4 % — ABNORMAL LOW (ref 36.0–46.0)
Hemoglobin: 11.1 g/dL — ABNORMAL LOW (ref 12.0–15.0)
MCH: 27.4 pg (ref 26.0–34.0)
MCHC: 32.3 g/dL (ref 30.0–36.0)
MCV: 84.9 fL (ref 80.0–100.0)
Platelets: 258 10*3/uL (ref 150–400)
RBC: 4.05 MIL/uL (ref 3.87–5.11)
RDW: 12.7 % (ref 11.5–15.5)
WBC: 5 10*3/uL (ref 4.0–10.5)
nRBC: 0 % (ref 0.0–0.2)

## 2023-12-07 LAB — COMPREHENSIVE METABOLIC PANEL
ALT: 10 U/L (ref 0–44)
AST: 14 U/L — ABNORMAL LOW (ref 15–41)
Albumin: 3.5 g/dL (ref 3.5–5.0)
Alkaline Phosphatase: 43 U/L (ref 38–126)
Anion gap: 5 (ref 5–15)
BUN: 6 mg/dL (ref 6–20)
CO2: 21 mmol/L — ABNORMAL LOW (ref 22–32)
Calcium: 8.4 mg/dL — ABNORMAL LOW (ref 8.9–10.3)
Chloride: 109 mmol/L (ref 98–111)
Creatinine, Ser: 0.72 mg/dL (ref 0.44–1.00)
GFR, Estimated: >60 mL/min (ref >60)
Glucose, Bld: 110 mg/dL — ABNORMAL HIGH (ref 70–99)
Potassium: 3.8 mmol/L (ref 3.5–5.1)
Sodium: 135 mmol/L (ref 135–145)
Total Bilirubin: 0.2 mg/dL (ref 0.0–1.2)
Total Protein: 6.7 g/dL (ref 6.5–8.1)

## 2023-12-07 LAB — HCG, QUANTITATIVE, PREGNANCY: hCG, Beta Chain, Quant, S: 33938 m[IU]/mL — ABNORMAL HIGH (ref <5)

## 2023-12-07 NOTE — MAU Note (Signed)
.  Kelly [name] is a 33 y.o. at [redacted]w[redacted]d here in MAU reporting: Patient reports she that 0830 she saw two bright red blood blood clots. Patient reports she has soaked 1 pad in the hour.   Onset of complaint: today  Pain score: denies  There were no vitals filed for this visit.    Lab orders placed from triage:   none

## 2023-12-07 NOTE — MAU Provider Note (Signed)
 Chief Complaint: Vaginal Bleeding   Event Date/Time   First Provider Initiated Contact with Patient 12/07/23 1003      SUBJECTIVE HPI: Kelly [name] is a 33 y.o. G3P1011 at [redacted]w[redacted]d by early ultrasound who presents to maternity admissions reporting bright red bleeding and passage of large clots with what looked like tissue this morning around 0830, prior to that she was having painful cramping which has since subsided.  She denies vaginal bleeding, vaginal itching/burning, urinary symptoms, h/a, dizziness, n/v, or fever/chills.    HPI  Past Medical History:  Diagnosis Date   ACL tear    Acute medial meniscus tear of left knee 06/20/2020   Acute medial meniscus tear of left knee 06/20/2020   Adjustment disorder with mixed anxiety and depressed mood 04/12/2014   ASCUS with positive high risk HPV cervical 05/13/2014   2015: Pap ASCUS + HR HPV  2015: Colpo CIN 1  2024 Nml   Left anterior cruciate ligament tear 06/20/2020   Vaginal Pap smear, abnormal    Past Surgical History:  Procedure Laterality Date   KNEE ARTHROSCOPY WITH ANTERIOR CRUCIATE LIGAMENT (ACL) REPAIR WITH HAMSTRING GRAFT Left 06/30/2020   Procedure: LEFT KNEE ARTHROSCOPY WITH ANTERIOR CRUCIATE LIGAMENT (ACL) RECONSTRUCTION, PARTIAL MEDIAL MENISCUS REPAIR;  Surgeon: Tarry Kos, MD;  Location: [facility];  Service: Orthopedics;  Laterality: Left;   Social History   Socioeconomic History   Marital status: Married    Spouse name: Not on file   Number of children: Not on file   Years of education: Not on file   Highest education level: Not on file  Occupational History   Not on file  Tobacco Use   Smoking status: Never   Smokeless tobacco: Never  Vaping Use   Vaping status: Never Used  Substance and Sexual Activity   Alcohol use: Not Currently    Comment: occas, prior to pregnancy   Drug use: Never   Sexual activity: Not Currently    Partners: Male    Birth control/protection: None    Comment:  pt reports IC this past week  Other Topics Concern   Not on file  Social History Narrative   ** Merged History Encounter **       Social Drivers of Health   Financial Resource Strain: Not on file  Food Insecurity: No Food Insecurity (05/17/2023)   Hunger Vital Sign    Worried About Running Out of Food in the Last Year: Never true    Ran Out of Food in the Last Year: Never true  Transportation Needs: No Transportation Needs (05/17/2023)   PRAPARE - Administrator, Civil Service (Medical): No    Lack of Transportation (Non-Medical): No  Physical Activity: Not on file  Stress: Not on file  Social Connections: Unknown (02/10/2022)   Received from [facility], [facility]   Social Network    Social Network: Not on file  Intimate Partner Violence: Not At Risk (05/17/2023)   Humiliation, Afraid, Rape, and Kick questionnaire    Fear of Current or Ex-Partner: No    Emotionally Abused: No    Physically Abused: No    Sexually Abused: No   No current facility-administered medications on file prior to encounter.   Current Outpatient Medications on File Prior to Encounter  Medication Sig Dispense Refill   [DISCONTINUED] promethazine (PHENERGAN) 25 MG tablet Take 1 tablet (25 mg total) by mouth every 6 (six) hours as needed for nausea. 30 tablet 1   No  Known Allergies  ROS:  Pertinent positives/negatives listed above.  I have reviewed patient's Past Medical Hx, Surgical Hx, Family Hx, Social Hx, medications and allergies.   Physical Exam  Patient Vitals for the past 24 hrs:  BP Temp Pulse Resp SpO2  12/07/23 0952 120/76 97.9 F (36.6 C) 79 16 100 %   Constitutional: Well-developed, well-nourished female in no acute distress.  Cardiovascular: normal rate Respiratory: normal effort GI: Abd soft, non-tender. Pos BS x 4 MS: Extremities nontender, no edema, normal ROM Neurologic: Alert and oriented x 4.  GU: Neg CVAT.  PELVIC EXAM: Cervix pink, visually closed,  without lesion, scant dark bloody discharge, vaginal walls and external genitalia normal  LAB RESULTS Results for orders placed or performed during the hospital encounter of 12/07/23 (from the past 24 hours)  Type and screen [facility]     Status: None (Preliminary result)   Collection Time: 12/07/23 10:04 AM  Result Value Ref Range   ABO/RH(D) PENDING    Antibody Screen PENDING    Sample Expiration      12/10/2023,2359 Performed at [facility], [address], [place], [place] [address]   CBC     Status: Abnormal   Collection Time: 12/07/23 10:08 AM  Result Value Ref Range   WBC 5.0 4.0 - 10.5 K/uL   RBC 4.05 3.87 - 5.11 MIL/uL   Hemoglobin 11.1 (L) 12.0 - 15.0 g/dL   HCT 60.4 (L) 54.0 - 98.1 %   MCV 84.9 80.0 - 100.0 fL   MCH 27.4 26.0 - 34.0 pg   MCHC 32.3 30.0 - 36.0 g/dL   RDW 19.1 47.8 - 29.5 %   Platelets 258 150 - 400 K/uL   nRBC 0.0 0.0 - 0.2 %    --/--/PENDING (03/09 1004)  IMAGING US OB Transvaginal Result Date: 12/07/2023 CLINICAL DATA:  Vaginal bleeding in 1st trimester pregnancy. EXAM: TRANSVAGINAL OB ULTRASOUND TECHNIQUE: Transvaginal ultrasound was performed for complete evaluation of the gestation as well as the maternal uterus, adnexal regions, and pelvic cul-de-sac. COMPARISON:  12/04/2023 FINDINGS: Intrauterine gestational sac: Single Yolk sac:  Visualized. Embryo:  Visualized. Cardiac Activity: Visualized. Heart Rate: 126 bpm CRL:   8 mm   6 w 5 d                  [place] EDC: 07/27/2024 Subchorionic hemorrhage:  Small subchorionic hemorrhage noted. Maternal uterus/adnexae: Both ovaries are normal in appearance. No mass or abnormal free fluid identified. IMPRESSION: Single living IUP with assigned gestational age of [redacted] weeks 4 days by prior ultrasound. Appropriate interval growth. Small subchorionic hemorrhage. Electronically Signed   By: Danae Orleans M.D.   On: 12/07/2023 10:50   US OB LESS THAN 14 WEEKS WITH OB TRANSVAGINAL Result Date:  12/04/2023 CLINICAL DATA:  Bleeding in early pregnancy, initial encounter EXAM: OBSTETRIC <14 WK [place] AND TRANSVAGINAL OB US TECHNIQUE: Both transabdominal and transvaginal ultrasound examinations were performed for complete evaluation of the gestation as well as the maternal uterus, adnexal regions, and pelvic cul-de-sac. Transvaginal technique was performed to assess early pregnancy. COMPARISON:  None Available. FINDINGS: Intrauterine gestational sac: Present Yolk sac:  Present Embryo:  Present Cardiac Activity: Present Heart Rate: 116 bpm CRL:  5.2 mm   6 w   1 d                  [place] EDC: 07/28/2024 Subchorionic hemorrhage: Small subchorionic hemorrhage is noted measuring up to 5 mm. Maternal uterus/adnexae: Ovaries  are within normal limits. IMPRESSION: Single live intrauterine gestation at 6 weeks 1 day. Small subchorionic hemorrhage is noted. Electronically Signed   By: Alcide Clever M.D.   On: 12/04/2023 01:58    MAU Management/MDM: Orders Placed This Encounter  Procedures   US OB Transvaginal   hCG, quantitative, pregnancy   CBC   Comprehensive metabolic panel   Type and screen [facility]   Discharge patient Discharge disposition: 01-Home or Self Care; Discharge patient date: 12/07/2023    No orders of the defined types were placed in this encounter.   Treatments in MAU included SSE.  ASSESSMENT 1. [redacted] weeks gestation of pregnancy   2. Subchorionic hematoma in first trimester, single or unspecified fetus   3. Vaginal bleeding affecting early pregnancy   Clinical picture suspicious of miscarriage - will get bHCG and [place] to determine viability of pregnancy seen at last visit on 3/6 - appropriate fetal growth and FHR noted on [place].   Suspect bleeding secondary to [facility]  PLAN Discharge home with strict return precautions. Return precautions provided  Allergies as of 12/07/2023   No Known Allergies      Medication List    You have not been prescribed any medications.      Follow-up Information     Center for [facility]. Schedule an appointment as soon as possible for a visit.   Specialty: Obstetrics and Gynecology Why: call to establish prenatal care Contact information: [address] [place] [address] [phone]                Wyn Forster, MD FMOB Fellow, Faculty practice [facility], Center for [facility]  12/07/2023  10:53 AM

## 2024-01-12 ENCOUNTER — Ambulatory Visit (INDEPENDENT_AMBULATORY_CARE_PROVIDER_SITE_OTHER): Admitting: Obstetrics and Gynecology

## 2024-01-12 ENCOUNTER — Encounter: Payer: Self-pay | Admitting: Obstetrics and Gynecology

## 2024-01-12 ENCOUNTER — Other Ambulatory Visit (HOSPITAL_COMMUNITY)
Admission: RE | Admit: 2024-01-12 | Discharge: 2024-01-12 | Disposition: A | Discharge status: Home / Self Care | Source: Ambulatory Visit | Attending: Obstetrics and Gynecology | Admitting: Obstetrics and Gynecology

## 2024-01-12 VITALS — BP 112/73 | HR 71 | Wt 169.0 lb

## 2024-01-12 DIAGNOSIS — Z789 Other specified health status: Secondary | ICD-10-CM

## 2024-01-12 DIAGNOSIS — Z3480 Encounter for supervision of other normal pregnancy, unspecified trimester: Secondary | ICD-10-CM | POA: Insufficient documentation

## 2024-01-12 DIAGNOSIS — O09891 Supervision of other high risk pregnancies, first trimester: Secondary | ICD-10-CM | POA: Insufficient documentation

## 2024-01-12 DIAGNOSIS — Z3A11 11 weeks gestation of pregnancy: Secondary | ICD-10-CM | POA: Diagnosis not present

## 2024-01-12 DIAGNOSIS — Z3481 Encounter for supervision of other normal pregnancy, first trimester: Secondary | ICD-10-CM

## 2024-01-12 NOTE — Progress Notes (Unsigned)
 NOB   Has 58 month old currently breastfeeding  Last pap:11/13/22 WNL  Genetic Screening:Yes and wants to know gender already had horizon.  CC: none

## 2024-01-13 ENCOUNTER — Encounter: Payer: Self-pay | Admitting: Obstetrics and Gynecology

## 2024-01-13 LAB — CBC/D/PLT+RPR+RH+ABO+RUBIGG...
Antibody Screen: NEGATIVE
Basophils Absolute: 0 10*3/uL (ref 0.0–0.2)
Basos: 0 %
EOS (ABSOLUTE): 0 10*3/uL (ref 0.0–0.4)
Eos: 0 %
HCV Ab: NONREACTIVE
HIV Screen 4th Generation wRfx: NONREACTIVE
Hematocrit: 35.3 % (ref 34.0–46.6)
Hemoglobin: 11.8 g/dL (ref 11.1–15.9)
Hepatitis B Surface Ag: NEGATIVE
Immature Grans (Abs): 0 10*3/uL (ref 0.0–0.1)
Immature Granulocytes: 0 %
Lymphocytes Absolute: 2 10*3/uL (ref 0.7–3.1)
Lymphs: 29 %
MCH: 28.2 pg (ref 26.6–33.0)
MCHC: 33.4 g/dL (ref 31.5–35.7)
MCV: 84 fL (ref 79–97)
Monocytes Absolute: 0.4 10*3/uL (ref 0.1–0.9)
Monocytes: 5 %
Neutrophils Absolute: 4.5 10*3/uL (ref 1.4–7.0)
Neutrophils: 66 %
Platelets: 302 10*3/uL (ref 150–450)
RBC: 4.19 x10E6/uL (ref 3.77–5.28)
RDW: 12.8 % (ref 11.7–15.4)
RPR Ser Ql: NONREACTIVE
Rh Factor: POSITIVE
Rubella Antibodies, IGG: 2.65 {index} (ref >0.99)
WBC: 6.9 10*3/uL (ref 3.4–10.8)

## 2024-01-13 LAB — CULTURE, OB URINE

## 2024-01-13 LAB — HEMOGLOBIN A1C
Est. average glucose Bld gHb Est-mCnc: 108 mg/dL
Hgb A1c MFr Bld: 5.4 % (ref 4.8–5.6)

## 2024-01-13 LAB — HCV INTERPRETATION

## 2024-01-13 LAB — URINE CULTURE, OB REFLEX

## 2024-01-13 NOTE — Addendum Note (Signed)
 Addended by: Frederico Jan on: 01/13/2024 10:56 AM   Modules accepted: Orders

## 2024-01-13 NOTE — Progress Notes (Signed)
 New OB Note  01/12/2024   Clinic: Center for [facility]  Chief Complaint: new OB  Transfer of Care Patient: no  History of Present Illness: [name] is a 33 y.o. G3P1011 at 11/5 weeks (EDC 10/29, based on 6wk u/s; patient has not had a period since her AUG 2024 SVD.   Preg complicated by has Blood product declined; Supervision of other normal pregnancy, antepartum; and Short interval between pregnancies affecting pregnancy in first trimester, antepartum on their problem list.   No s/s of SAB or morning sickness.   ROS: A 12-point review of systems was performed and negative, except as stated in the above HPI.  OBGYN History: As per HPI. OB History  Gravida Para Term Preterm AB Living  3 1 1  0 1 1  SAB IAB Ectopic Multiple Live Births  1 0 0 0 1    # Outcome Date GA Lbr Len/2nd Weight Sex Type Anes PTL Lv  3 Current           2 Term 05/17/23 [redacted]w[redacted]d 10:11 / 00:36 6 lb 3.1 oz (2.81 kg) M Vag-Spont EPI  LIV  1 SAB 2014           Prior child are healthy, doing well, and without any problems or issues: yes History of pap smears: Yes. Last pap smear 11/13/22 and results were negative   Past Medical History: Past Medical History:  Diagnosis Date   Acute medial meniscus tear of left knee 06/20/2020   Adjustment disorder with mixed anxiety and depressed mood 04/12/2014   ASCUS with positive high risk HPV cervical 05/13/2014   2015: Pap ASCUS + HR HPV  2015: Colpo CIN 1  2024 Nml    Past Surgical History: Past Surgical History:  Procedure Laterality Date   KNEE ARTHROSCOPY WITH ANTERIOR CRUCIATE LIGAMENT (ACL) REPAIR WITH HAMSTRING GRAFT Left 06/30/2020   Procedure: LEFT KNEE ARTHROSCOPY WITH ANTERIOR CRUCIATE LIGAMENT (ACL) RECONSTRUCTION, PARTIAL MEDIAL MENISCUS REPAIR;  Surgeon: Tarry Kos, MD;  Location: [facility];  Service: Orthopedics;  Laterality: Left;    Family History:  Family History  Problem Relation Age of Onset    Hypertension Mother    Diabetes Father    Hypertension Brother    Breast cancer Maternal Grandmother 49   Pancreatic cancer Maternal Grandmother    Social History:  Social History   Socioeconomic History   Marital status: Married    Spouse name: Not on file   Number of children: Not on file   Years of education: Not on file   Highest education level: Not on file  Occupational History   Not on file  Tobacco Use   Smoking status: Never   Smokeless tobacco: Never  Vaping Use   Vaping status: Never Used  Substance and Sexual Activity   Alcohol use: Not Currently    Comment: occas, prior to pregnancy   Drug use: Never   Sexual activity: Not Currently    Partners: Male    Birth control/protection: None    Comment: pt reports IC this past week  Other Topics Concern   Not on file  Social History Narrative   ** Merged History Encounter **       Social Drivers of Health   Financial Resource Strain: Not on file  Food Insecurity: No Food Insecurity (05/17/2023)   Hunger Vital Sign    Worried About Running Out of Food in the Last Year: Never true    Ran Out of Food  in the Last Year: Never true  Transportation Needs: No Transportation Needs (05/17/2023)   PRAPARE - Administrator, Civil Service (Medical): No    Lack of Transportation (Non-Medical): No  Physical Activity: Not on file  Stress: Not on file  Social Connections: Unknown (02/10/2022)   Received from [facility]   Social Network    Social Network: Not on file  Intimate Partner Violence: Not At Risk (05/17/2023)   Humiliation, Afraid, Rape, and Kick questionnaire    Fear of Current or Ex-Partner: No    Emotionally Abused: No    Physically Abused: No    Sexually Abused: No   Allergy: No Known Allergies  Current Outpatient Medications: Prenatal vitamin  Physical Exam:   BP 112/73   Pulse 71   Wt 169 lb (76.7 kg) Comment: pt weighed at home office scale not working  BMI 26.47  kg/m  Body mass index is 26.47 kg/m. Contractions: Not present Vag. Bleeding: None. FHTs: 150s  General appearance: Well nourished, well developed female in no acute distress.  Neck:  Supple, normal appearance, and no thyromegaly  Cardiovascular: S1, S2 normal, no murmur, rub or gallop, regular rate and rhythm Respiratory:  Clear to auscultation bilateral. Normal respiratory effort Abdomen: positive bowel sounds and no masses, hernias; diffusely non tender to palpation, non distended Neuro/Psych:  Normal mood and affect.  Skin:  Warm and dry.  Lymphatic:  No inguinal lymphadenopathy.   Pelvic exam: is not limited by body habitus EGBUS: within normal limits, Vagina: within normal limits and with no blood in the vault, Cervix: normal appearing cervix, closed/long,  without discharge or lesions, closed/long/high, Uterus:  enlarged, c/w 12 week size, and Adnexa:  normal adnexa and no mass, fullness, tenderness  Laboratory: none  Imaging:  As per HPI  Assessment: patient doing well  Plan: 1. Supervision of other normal pregnancy, antepartum (Primary) Patient doing well. Offer afp next visit.  clinic to schedule MFM anatomy u/s. D/w her okay to continue breastfeeding.  - CBC/D/Plt+RPR+Rh+ABO+RubIgG... - Hemoglobin A1c - Culture, OB Urine - US  MFM OB COMP + 14 WK; Future - Cervicovaginal ancillary only( [place])  2. [redacted] weeks gestation of pregnancy - CBC/D/Plt+RPR+Rh+ABO+RubIgG... - Hemoglobin A1c - Culture, OB Urine - US  MFM OB COMP + 14 WK; Future  3. Encounter for supervision of other normal pregnancy in first trimester - PANORAMA PRENATAL TEST - US  MFM OB COMP + 14 WK; Future  4. Blood product declined Confirms today. D/w her re: aggressive treatment of any anemia during pregnancy  5. Short interval between pregnancies affecting pregnancy in first trimester, antepartum  Problem list reviewed and updated.  Follow up in 4 weeks.  >50% of 35 min visit spent on  counseling and coordination of care.  Return in about 1 month (around 02/11/2024) for low risk ob, md or app, in person.  Future Appointments  Date Time Provider Department Center  02/13/2024 10:35 AM Raynell Caller, MD [facility]    Tyler Gallant MD Attending Center for [facility])

## 2024-01-14 LAB — CERVICOVAGINAL ANCILLARY ONLY
Chlamydia: NEGATIVE
Comment: NEGATIVE
Comment: NORMAL
Neisseria Gonorrhea: NEGATIVE

## 2024-01-19 LAB — PANORAMA PRENATAL TEST FULL PANEL:PANORAMA TEST PLUS 5 ADDITIONAL MICRODELETIONS: FETAL FRACTION: 7.6

## 2024-02-02 ENCOUNTER — Other Ambulatory Visit: Payer: Self-pay | Admitting: *Deleted

## 2024-02-02 DIAGNOSIS — Z3480 Encounter for supervision of other normal pregnancy, unspecified trimester: Secondary | ICD-10-CM

## 2024-02-10 ENCOUNTER — Other Ambulatory Visit

## 2024-02-10 DIAGNOSIS — Z3480 Encounter for supervision of other normal pregnancy, unspecified trimester: Secondary | ICD-10-CM

## 2024-02-12 LAB — AFP, SERUM, OPEN SPINA BIFIDA
AFP MoM: 1.45
AFP Value: 46.2 ng/mL
Gest. Age on Collection Date: 15.6 wk
Maternal Age At EDD: 31.2 a
OSBR Risk 1 IN: 6265
Test Results:: NEGATIVE
Weight: 169 [lb_av]

## 2024-02-13 ENCOUNTER — Encounter: Admitting: Obstetrics and Gynecology

## 2024-02-21 ENCOUNTER — Encounter (HOSPITAL_COMMUNITY): Payer: Self-pay | Admitting: Obstetrics and Gynecology

## 2024-02-21 ENCOUNTER — Ambulatory Visit: Payer: Self-pay | Admitting: Obstetrics and Gynecology

## 2024-02-21 ENCOUNTER — Other Ambulatory Visit: Payer: Self-pay

## 2024-02-21 ENCOUNTER — Inpatient Hospital Stay (HOSPITAL_COMMUNITY)
Admission: AD | Admit: 2024-02-21 | Discharge: 2024-02-21 | Disposition: A | Discharge status: Home / Self Care | Attending: Obstetrics and Gynecology | Admitting: Obstetrics and Gynecology

## 2024-02-21 DIAGNOSIS — N390 Urinary tract infection, site not specified: Secondary | ICD-10-CM | POA: Diagnosis not present

## 2024-02-21 DIAGNOSIS — R300 Dysuria: Secondary | ICD-10-CM | POA: Diagnosis present

## 2024-02-21 DIAGNOSIS — R109 Unspecified abdominal pain: Secondary | ICD-10-CM | POA: Diagnosis not present

## 2024-02-21 DIAGNOSIS — N898 Other specified noninflammatory disorders of vagina: Secondary | ICD-10-CM | POA: Diagnosis not present

## 2024-02-21 DIAGNOSIS — O4702 False labor before 37 completed weeks of gestation, second trimester: Secondary | ICD-10-CM | POA: Diagnosis present

## 2024-02-21 DIAGNOSIS — Z3A17 17 weeks gestation of pregnancy: Secondary | ICD-10-CM

## 2024-02-21 DIAGNOSIS — O2342 Unspecified infection of urinary tract in pregnancy, second trimester: Secondary | ICD-10-CM | POA: Insufficient documentation

## 2024-02-21 DIAGNOSIS — O26892 Other specified pregnancy related conditions, second trimester: Secondary | ICD-10-CM

## 2024-02-21 DIAGNOSIS — Z113 Encounter for screening for infections with a predominantly sexual mode of transmission: Secondary | ICD-10-CM | POA: Diagnosis present

## 2024-02-21 DIAGNOSIS — Z3480 Encounter for supervision of other normal pregnancy, unspecified trimester: Secondary | ICD-10-CM

## 2024-02-21 DIAGNOSIS — O26899 Other specified pregnancy related conditions, unspecified trimester: Secondary | ICD-10-CM

## 2024-02-21 LAB — WET PREP, GENITAL
Clue Cells Wet Prep HPF POC: NONE SEEN
Sperm: NONE SEEN
Trich, Wet Prep: NONE SEEN
WBC, Wet Prep HPF POC: <10 (ref <10)
Yeast Wet Prep HPF POC: NONE SEEN

## 2024-02-21 LAB — URINALYSIS, ROUTINE W REFLEX MICROSCOPIC
Bilirubin Urine: NEGATIVE
Glucose, UA: NEGATIVE mg/dL
Hgb urine dipstick: NEGATIVE
Ketones, ur: NEGATIVE mg/dL
Leukocytes,Ua: NEGATIVE
Nitrite: NEGATIVE
Protein, ur: NEGATIVE mg/dL
Specific Gravity, Urine: 1.015 (ref 1.005–1.030)
pH: 7 (ref 5.0–8.0)

## 2024-02-21 MED ORDER — TERCONAZOLE 0.4 % VA CREA
1.0000 | TOPICAL_CREAM | Freq: Every day | VAGINAL | 0 refills | Status: DC
Start: 2024-02-21 — End: 2024-04-06

## 2024-02-21 MED ORDER — CEFADROXIL 500 MG PO CAPS: 500.0000 mg | ORAL_CAPSULE | Freq: Two times a day (BID) | ORAL | 0 refills | Status: DC

## 2024-02-21 NOTE — MAU Note (Addendum)
 Kelly [name] is a 33 y.o. at [redacted]w[redacted]d here in MAU reporting: irregular, infrequent contractions that started Thursday. She reports feeling vaginal pressure, vaginal burning, bilateral, lower back pain/pressure, and dysuria. She states her urine is foul smelling. Patient denies having any vaginal bleeding or LOF. Patient last took Tylenol  at 0400 today with minimal relief.   Onset of complaint: Thursday 02/19/24 Pain score: 8 Vitals:   02/21/24 1223  BP: 106/65  Pulse: 92  Resp: 18  Temp: 98 F (36.7 C)  SpO2: 100%     FHR-148   Lab orders placed from triage: urinalysis

## 2024-02-21 NOTE — Discharge Instructions (Addendum)
   Buy this chicken bouillon powder to make chicken broth (for chicken noodle soup or any other uses) or to season noodles for Ramen.

## 2024-02-21 NOTE — MAU Provider Note (Signed)
 History     CSN: [id number]  Arrival date and time: 02/21/24 1203   Event Date/Time   First Provider Initiated Contact with Patient 02/21/24 1344      Chief Complaint  Patient presents with   Contractions   Vaginal Discharge   vaginal burning   Dysuria   HPI [name] is a 33 y.o. year old G62P1011 female at [redacted]w[redacted]d weeks gestation who presents to MAU reporting irregular, infrequent contractions that started Thursday; lasting 3-5 seconds. She messaged her OB provider about it and was advised to come have it checked out. She has been feeling vaginal pressure, vaginal burning, bilateral lower back pain/pressure, dysuria, and foul smelling urine. She denies VB or LOF. She reports she is taking care of a 62 month old baby and does not remember having pain like this while pregnant with him.She take Tylenol  ES @ 0400 with minimal relief. She also increased her H2O intake and that improved her symptoms. She reports she has been drinking "a lot" of Sherrian Doheny and eating a lot of Ramen noodles because they were what would stay down. She had not realized the dehydrating effects consuming both those products has on the body. She reports she cut down on the Ramen noodle and the peach ice tea intake. She also reports some external vaginal "burning" and would like to know if she could have a cream to help. She receives [facility] with [facility]; next appt is 03/08/2024.   OB History     Gravida  3   Para  1   Term  1   Preterm  0   AB  1   Living  1      SAB  1   IAB  0   Ectopic  0   Multiple  0   Live Births  1           Past Medical History:  Diagnosis Date   Acute medial meniscus tear of left knee 06/20/2020   Adjustment disorder with mixed anxiety and depressed mood 04/12/2014   ASCUS with positive high risk HPV cervical 05/13/2014   2015: Pap ASCUS + HR HPV  2015: Colpo CIN 1  2024 Nml    Past Surgical History:  Procedure Laterality Date   KNEE  ARTHROSCOPY WITH ANTERIOR CRUCIATE LIGAMENT (ACL) REPAIR WITH HAMSTRING GRAFT Left 06/30/2020   Procedure: LEFT KNEE ARTHROSCOPY WITH ANTERIOR CRUCIATE LIGAMENT (ACL) RECONSTRUCTION, PARTIAL MEDIAL MENISCUS REPAIR;  Surgeon: Wes Hamman, MD;  Location: [facility];  Service: Orthopedics;  Laterality: Left;    Family History  Problem Relation Age of Onset   Hypertension Mother    Diabetes Father    Hypertension Brother    Breast cancer Maternal Grandmother 64   Pancreatic cancer Maternal Grandmother     Social History   Tobacco Use   Smoking status: Never   Smokeless tobacco: Never  Vaping Use   Vaping status: Never Used  Substance Use Topics   Alcohol use: Not Currently    Comment: occas, prior to pregnancy   Drug use: Never    Allergies: No Known Allergies  Medications Prior to Admission  Medication Sig Dispense Refill Last Dose/Taking   Prenatal Vit-Fe Fumarate-FA (MULTIVITAMIN-PRENATAL) 27-0.8 MG TABS tablet Take 1 tablet by mouth daily at 12 noon.   02/21/2024 Morning    Review of Systems  Constitutional: Negative.   HENT: Negative.    Eyes: Negative.   Respiratory: Negative.    Cardiovascular: Negative.  Gastrointestinal: Negative.   Endocrine: Negative.   Genitourinary:  Positive for dysuria (pain after urintation), frequency, pelvic pain and vaginal pain ("burning on the outside").  Musculoskeletal:  Positive for back pain.  Skin: Negative.   Allergic/Immunologic: Negative.   Neurological: Negative.   Hematological: Negative.   Psychiatric/Behavioral: Negative.     Physical Exam   Blood pressure 106/65, pulse 92, temperature 98 F (36.7 C), temperature source Oral, resp. rate 18, height 5\' 7"  (1.702 m), weight 81.3 kg, SpO2 100%, currently breastfeeding.  Physical Exam Vitals and nursing note reviewed.  Constitutional:      Appearance: Normal appearance. She is normal weight.  Cardiovascular:     Rate and Rhythm: Normal rate.   Pulmonary:     Effort: Pulmonary effort is normal.  Abdominal:     General: Abdomen is flat.     Palpations: Abdomen is soft.  Genitourinary:    Comments: Swabs collected by patient using blind swab technique  Musculoskeletal:        General: Normal range of motion.  Skin:    General: Skin is warm and dry.  Neurological:     Mental Status: She is alert and oriented to person, place, and time.  Psychiatric:        Mood and Affect: Mood normal.        Behavior: Behavior normal.        Thought Content: Thought content normal.        Judgment: Judgment normal.    FHTs by doppler: 148 bpm  MAU Course  Procedures  MDM CCUA UCx -- Results pending  Wet Prep GC/CT -- Results pending   Results for orders placed or performed during the hospital encounter of 02/21/24 (from the past 24 hours)  Urinalysis, Routine w reflex microscopic -Urine, Clean Catch     Status: None   Collection Time: 02/21/24  1:39 PM  Result Value Ref Range   Color, Urine YELLOW YELLOW   APPearance CLEAR CLEAR   Specific Gravity, Urine 1.015 1.005 - 1.030   pH 7.0 5.0 - 8.0   Glucose, UA NEGATIVE NEGATIVE mg/dL   Hgb urine dipstick NEGATIVE NEGATIVE   Bilirubin Urine NEGATIVE NEGATIVE   Ketones, ur NEGATIVE NEGATIVE mg/dL   Protein, ur NEGATIVE NEGATIVE mg/dL   Nitrite NEGATIVE NEGATIVE   Leukocytes,Ua NEGATIVE NEGATIVE    Assessment and Plan  1. Abdominal cramping complicating pregnancy - Explained that the cramping could be a result of UTI, dehydration or normal variance of 2nd trimester pregnancy  2. UTI (urinary tract infection) during pregnancy, second trimester - Prescription for: Cefadroxil  500 mg po BID x 10 days - Advised may need to change abx after UCx is resulted  3. Vaginal irritation - Prescription for: Terazol 0.4% vaginal cream to be used externally or internally  4. [redacted] weeks gestation of pregnancy   - Discharge home - Keep scheduled appt with [place] on 03/08/2024 - Patient verbalized  an understanding of the plan of care and agrees.   Almond Army, CNM 02/21/2024, 2:17 PM

## 2024-02-22 LAB — CULTURE, OB URINE: Culture: NO GROWTH

## 2024-02-24 LAB — GC/CHLAMYDIA PROBE AMP (~~LOC~~) NOT AT ARMC
Chlamydia: NEGATIVE
Comment: NEGATIVE
Comment: NORMAL
Neisseria Gonorrhea: NEGATIVE

## 2024-03-08 ENCOUNTER — Ambulatory Visit

## 2024-03-08 ENCOUNTER — Ambulatory Visit: Attending: Obstetrics and Gynecology | Admitting: Obstetrics and Gynecology

## 2024-03-08 ENCOUNTER — Telehealth: Payer: Self-pay | Admitting: Obstetrics & Gynecology

## 2024-03-08 ENCOUNTER — Telehealth: Admitting: Obstetrics and Gynecology

## 2024-03-08 VITALS — BP 118/56 | HR 87

## 2024-03-08 DIAGNOSIS — O09892 Supervision of other high risk pregnancies, second trimester: Secondary | ICD-10-CM

## 2024-03-08 DIAGNOSIS — Z789 Other specified health status: Secondary | ICD-10-CM

## 2024-03-08 DIAGNOSIS — D563 Thalassemia minor: Secondary | ICD-10-CM

## 2024-03-08 DIAGNOSIS — Z3689 Encounter for other specified antenatal screening: Secondary | ICD-10-CM | POA: Insufficient documentation

## 2024-03-08 DIAGNOSIS — Z3A19 19 weeks gestation of pregnancy: Secondary | ICD-10-CM

## 2024-03-08 DIAGNOSIS — Z3480 Encounter for supervision of other normal pregnancy, unspecified trimester: Secondary | ICD-10-CM

## 2024-03-08 DIAGNOSIS — O09891 Supervision of other high risk pregnancies, first trimester: Secondary | ICD-10-CM

## 2024-03-08 DIAGNOSIS — Z3482 Encounter for supervision of other normal pregnancy, second trimester: Secondary | ICD-10-CM

## 2024-03-08 DIAGNOSIS — Z36 Encounter for antenatal screening for chromosomal anomalies: Secondary | ICD-10-CM | POA: Insufficient documentation

## 2024-03-08 DIAGNOSIS — O99012 Anemia complicating pregnancy, second trimester: Secondary | ICD-10-CM | POA: Diagnosis not present

## 2024-03-08 NOTE — Progress Notes (Signed)
 Maternal-Fetal Medicine Consultation Name: Kelly [name] MRN: [id number]  G3 P1011 at 19w 5d gestation.  Patient is here for fetal anatomy scan. On cell-free fetal DNA screening, the risks of aneuploidies are not increased. MSAFP screening showed low risk for open-neural tube defects. Obstetric history is significant for a term vaginal delivery. We performed fetal anatomy scan. No makers of aneuploidies or fetal structural defects are seen. Fetal biometry is consistent with her previously-established dates. Amniotic fluid is normal and good fetal activity is seen. Patient understands the limitations of ultrasound in detecting fetal anomalies.  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  I reassured her that given her husband has screened negative, the risk for the fetus being affected with alpha thalassemia is extremely unlikely.  Short Interpregnancy Interval It is defined as the time interval between the end of previous pregnancy and the beginning of next pregnancy. The impact of short pregnancy interval on the outcome of subsequent pregnancy is uncertain. Overall, we should expect good pregnancy outcomes if there are no other high-risk factors. I encouraged the patient to take prenatal vitamins daily to prevent anemia.  Iron supplements may be necessary if she develops anemia.   Recommendations - Follow-up scans as clinically indicated.   Consultation including face-to-face (more than 50%) counseling 15 minutes.

## 2024-03-08 NOTE — Progress Notes (Signed)
    TELEHEALTH OBSTETRICS VISIT ENCOUNTER NOTE  Provider location: Center for [facility]   Patient location: Home  I connected with Kelly [name] on 03/08/24 at  2:30 PM EDT by telephone at home and verified that I am speaking with the correct person using two identifiers. Of note, unable to do video encounter due to technical difficulties.   I discussed the limitations, risks, security and privacy concerns of performing an evaluation and management service by telephone and the availability of in person appointments. I also discussed with the patient that there may be a patient responsible charge related to this service. The patient expressed understanding and agreed to proceed.  Subjective:  Kelly [name] is a 33 y.o. G3P1011 at [redacted]w[redacted]d being followed for ongoing prenatal care.  She is currently monitored for the following issues for this low-risk pregnancy and has Blood product declined; Supervision of other normal pregnancy, antepartum; and Short interval between pregnancies affecting pregnancy in first trimester, antepartum on their problem list.  Patient reports no complaints. Reports fetal movement. Denies any contractions, bleeding or leaking of fluid.   The following portions of the patient's history were reviewed and updated as appropriate: allergies, current medications, past family history, past medical history, past social history, past surgical history and problem list.   Objective:  At MFM earlier today 118/56 HR 87 General:  Alert, oriented and cooperative.   Mental Status: Normal mood and affect perceived. Normal judgment and thought content.  Rest of physical exam deferred due to type of encounter  Assessment and Plan:  Pregnancy: G3P1011 at [redacted]w[redacted]d 1. [redacted] weeks gestation of pregnancy (Primary) Normal anatomy u/s today. Rpt PRN  2. Blood product declined Normal CBC at new OB  Preterm labor symptoms and general obstetric precautions including  but not limited to vaginal bleeding, contractions, leaking of fluid and fetal movement were reviewed in detail with the patient.  I discussed the assessment and treatment plan with the patient. The patient was provided an opportunity to ask questions and all were answered. The patient agreed with the plan and demonstrated an understanding of the instructions. The patient was advised to call back or seek an in-person office evaluation/go to MAU at [facility] for any urgent or concerning symptoms. Please refer to After Visit Summary for other counseling recommendations.   I provided 7 minutes of non-face-to-face time during this encounter.  No follow-ups on file.  No future appointments.  Raynell Caller, MD Center for [employer], [facility]

## 2024-03-08 NOTE — Telephone Encounter (Signed)
 Left voicemail for patient to call back regarding follow up appointment for ROB.

## 2024-04-06 ENCOUNTER — Ambulatory Visit (INDEPENDENT_AMBULATORY_CARE_PROVIDER_SITE_OTHER): Admitting: Obstetrics and Gynecology

## 2024-04-06 VITALS — BP 106/71 | HR 98 | Wt 191.0 lb

## 2024-04-06 DIAGNOSIS — D563 Thalassemia minor: Secondary | ICD-10-CM | POA: Insufficient documentation

## 2024-04-06 DIAGNOSIS — Z789 Other specified health status: Secondary | ICD-10-CM

## 2024-04-06 DIAGNOSIS — O09891 Supervision of other high risk pregnancies, first trimester: Secondary | ICD-10-CM

## 2024-04-06 DIAGNOSIS — Z3480 Encounter for supervision of other normal pregnancy, unspecified trimester: Secondary | ICD-10-CM

## 2024-04-06 DIAGNOSIS — Z3A23 23 weeks gestation of pregnancy: Secondary | ICD-10-CM | POA: Diagnosis not present

## 2024-04-06 NOTE — Progress Notes (Signed)
   PRENATAL VISIT NOTE  Subjective:  Kelly [name] is a 33 y.o. G3P1011 at 107w6d being seen today for ongoing prenatal care.  She is currently monitored for the following issues for this low-risk pregnancy and has Blood product declined; Supervision of other normal pregnancy, antepartum; Short interval between pregnancies affecting pregnancy in first trimester, antepartum; and Alpha thalassemia silent carrier on their problem list.  Patient reports no complaints.  Contractions: Irritability. Vag. Bleeding: None.  Movement: Present. Denies leaking of fluid.   The following portions of the patient's history were reviewed and updated as appropriate: allergies, current medications, past family history, past medical history, past social history, past surgical history and problem list.   Objective:   Vitals:   04/06/24 1610  BP: 106/71  Pulse: 98  Weight: 191 lb (86.6 kg)    Fetal Status: Fetal Heart Rate (bpm): 142   Movement: Present     General:  Alert, oriented and cooperative. Patient is in no acute distress.  Skin: Skin is warm and dry. No rash noted.   Cardiovascular: Normal heart rate noted  Respiratory: Normal respiratory effort, no problems with respiration noted  Abdomen: Soft, gravid, appropriate for gestational age.  Pain/Pressure: Present      Assessment and Plan:  Pregnancy: G3P1011 at [redacted]w[redacted]d 1. Supervision of other normal pregnancy, antepartum (Primary) 2. [redacted] weeks gestation of pregnancy Normal anatomy 6/9 Reviewed fasting for next visit for 2h gtt cbc hiv rpr and tdap Discussed weight gain goals for pregnancy  3. Alpha thalassemia silent carrier Partner is not a carrier  4. Short interval between pregnancies affecting pregnancy in first trimester, antepartum  5. Blood product declined F/u cbc next visit  Please refer to After Visit Summary for other counseling recommendations.   Return in about 4 weeks (around 05/04/2024) for return OB at 28 weeks with 2h GTT,  CBC, HIV, RPR and tdap.  No future appointments.  Kieth JAYSON Carolin, MD

## 2024-04-06 NOTE — Progress Notes (Signed)
ROB   CC: NONE   

## 2024-05-07 ENCOUNTER — Other Ambulatory Visit

## 2024-05-07 ENCOUNTER — Ambulatory Visit: Admitting: Obstetrics & Gynecology

## 2024-05-07 ENCOUNTER — Encounter: Payer: Self-pay | Admitting: Obstetrics & Gynecology

## 2024-05-07 VITALS — BP 110/73 | HR 74 | Wt 195.0 lb

## 2024-05-07 DIAGNOSIS — Z23 Encounter for immunization: Secondary | ICD-10-CM | POA: Diagnosis not present

## 2024-05-07 DIAGNOSIS — Z3480 Encounter for supervision of other normal pregnancy, unspecified trimester: Secondary | ICD-10-CM

## 2024-05-07 DIAGNOSIS — Z3483 Encounter for supervision of other normal pregnancy, third trimester: Secondary | ICD-10-CM | POA: Diagnosis not present

## 2024-05-07 DIAGNOSIS — Z3A28 28 weeks gestation of pregnancy: Secondary | ICD-10-CM | POA: Diagnosis not present

## 2024-05-07 NOTE — Progress Notes (Signed)
   PRENATAL VISIT NOTE  Subjective:  Kelly [name] is a 33 y.o. G3P1011 at [redacted]w[redacted]d being seen today for ongoing prenatal care.  She is currently monitored for the following issues for this low-risk pregnancy and has Blood product declined; Supervision of other normal pregnancy, antepartum; Short interval between pregnancies affecting pregnancy in first trimester, antepartum; and Alpha thalassemia silent carrier on their problem list.  Patient reports pelvic pressure occasionally, pelvic exam not desired.  Contractions: Not present. Vag. Bleeding: None.  Movement: Present. Denies leaking of fluid.   The following portions of the patient's history were reviewed and updated as appropriate: allergies, current medications, past family history, past medical history, past social history, past surgical history and problem list.   Objective:    Vitals:   05/07/24 0820  BP: 110/73  Pulse: 74  Weight: 195 lb (88.5 kg)    Fetal Status:  Fetal Heart Rate (bpm): 144 Fundal Height: 28 cm Movement: Present    General: Alert, oriented and cooperative. Patient is in no acute distress.  Skin: Skin is warm and dry. No rash noted.   Cardiovascular: Normal heart rate noted  Respiratory: Normal respiratory effort, no problems with respiration noted  Abdomen: Soft, gravid, appropriate for gestational age.  Pain/Pressure: Present     Pelvic: Cervical exam deferred        Extremities: Normal range of motion.  Edema: None  Mental Status: Normal mood and affect. Normal behavior. Normal judgment and thought content.   Assessment and Plan:  Pregnancy: G3P1011 at [redacted]w[redacted]d 1. Need for diphtheria-tetanus-pertussis (Tdap) vaccine - Tdap vaccine greater than or equal to 7yo IM given  2. [redacted] weeks gestation of pregnancy 3. Supervision of other normal pregnancy, antepartum (Primary) Third trimester labs today, will follow up results and manage accordingly. Third trimester expectations reviewed and all questions  answered. Preterm labor symptoms and general obstetric precautions including but not limited to vaginal bleeding, contractions, leaking of fluid and fetal movement were reviewed in detail with the patient. Please refer to After Visit Summary for other counseling recommendations.   Return in about 4 weeks (around 06/04/2024) for OFFICE OB VISIT (MD or APP) - 32 week Babyscript visit.  Future Appointments  Date Time Provider Department Center  05/07/2024 10:35 AM Joshus Rogan, Gloris LABOR, MD [facility]    Gloris Hugger, MD

## 2024-05-08 LAB — GLUCOSE TOLERANCE, 2 HOURS W/ 1HR
Glucose, 1 hour: 113 mg/dL (ref 70–179)
Glucose, 2 hour: 102 mg/dL (ref 70–152)
Glucose, Fasting: 80 mg/dL (ref 70–91)

## 2024-05-08 LAB — CBC
Hematocrit: 27.9 % — ABNORMAL LOW (ref 34.0–46.6)
Hemoglobin: 8.7 g/dL — ABNORMAL LOW (ref 11.1–15.9)
MCH: 27.9 pg (ref 26.6–33.0)
MCHC: 31.2 g/dL — ABNORMAL LOW (ref 31.5–35.7)
MCV: 89 fL (ref 79–97)
Platelets: 220 x10E3/uL (ref 150–450)
RBC: 3.12 x10E6/uL — ABNORMAL LOW (ref 3.77–5.28)
RDW: 12.2 % (ref 11.7–15.4)
WBC: 8.1 x10E3/uL (ref 3.4–10.8)

## 2024-05-08 LAB — HIV ANTIBODY (ROUTINE TESTING W REFLEX): HIV Screen 4th Generation wRfx: NONREACTIVE

## 2024-05-08 LAB — RPR: RPR Ser Ql: NONREACTIVE

## 2024-05-08 LAB — FERRITIN: Ferritin: 10 ng/mL — ABNORMAL LOW (ref 15–150)

## 2024-05-09 ENCOUNTER — Other Ambulatory Visit: Payer: Self-pay | Admitting: Family Medicine

## 2024-05-09 ENCOUNTER — Encounter: Payer: Self-pay | Admitting: Family Medicine

## 2024-05-09 DIAGNOSIS — O99013 Anemia complicating pregnancy, third trimester: Secondary | ICD-10-CM

## 2024-05-10 ENCOUNTER — Telehealth (HOSPITAL_COMMUNITY): Payer: Self-pay | Admitting: Pharmacy Technician

## 2024-05-10 ENCOUNTER — Ambulatory Visit: Payer: Self-pay | Admitting: Obstetrics & Gynecology

## 2024-05-10 DIAGNOSIS — Z3480 Encounter for supervision of other normal pregnancy, unspecified trimester: Secondary | ICD-10-CM

## 2024-05-10 NOTE — Telephone Encounter (Signed)
 Auth Submission: NO AUTH NEEDED Site of care: [facility] Payer: [employer], [employer]  Medication & CPT/J Code(s) submitted: Venofer  (Iron  Sucrose) J1756 Diagnosis Code:  Route of submission (phone, fax, portal): portal Phone # Fax # Auth type: Buy/Bill PB Units/visits requested: 200mg  x 5 doses Reference number: [id number]  Approval from: 05/10/24 to 09/09/24    Dagoberto Armour, CPhT [facility] Phone: [phone] 05/10/2024

## 2024-05-13 ENCOUNTER — Ambulatory Visit (INDEPENDENT_AMBULATORY_CARE_PROVIDER_SITE_OTHER)

## 2024-05-13 VITALS — BP 95/58 | HR 82 | Temp 97.9°F | Resp 16 | Ht 67.0 in | Wt 193.0 lb

## 2024-05-13 DIAGNOSIS — D649 Anemia, unspecified: Secondary | ICD-10-CM

## 2024-05-13 DIAGNOSIS — Z3A29 29 weeks gestation of pregnancy: Secondary | ICD-10-CM | POA: Diagnosis not present

## 2024-05-13 DIAGNOSIS — O99013 Anemia complicating pregnancy, third trimester: Secondary | ICD-10-CM

## 2024-05-13 MED ORDER — IRON SUCROSE 20 MG/ML IV SOLN
200.0000 mg | Freq: Once | INTRAVENOUS | Status: AC
  Administered 2024-05-13: 200 mg via INTRAVENOUS
  Filled 2024-05-13: qty 10

## 2024-05-13 NOTE — Progress Notes (Signed)
 Diagnosis: Acute Anemia  Provider:  Chilton Greathouse MD  Procedure: IV Push  IV Type: Peripheral, IV Location: L Antecubital  Venofer (Iron Sucrose), Dose: 200 mg  Post Infusion IV Care: Observation period completed and Peripheral IV Discontinued  Discharge: Condition: Good, Destination: Home . AVS Declined  Performed by:  Wyvonne Lenz, RN

## 2024-05-17 ENCOUNTER — Ambulatory Visit (INDEPENDENT_AMBULATORY_CARE_PROVIDER_SITE_OTHER)

## 2024-05-17 VITALS — BP 94/58 | HR 75 | Temp 98.3°F | Resp 20 | Ht 67.0 in | Wt 193.0 lb

## 2024-05-17 DIAGNOSIS — O99013 Anemia complicating pregnancy, third trimester: Secondary | ICD-10-CM | POA: Diagnosis not present

## 2024-05-17 DIAGNOSIS — Z3A29 29 weeks gestation of pregnancy: Secondary | ICD-10-CM | POA: Diagnosis not present

## 2024-05-17 DIAGNOSIS — D649 Anemia, unspecified: Secondary | ICD-10-CM | POA: Diagnosis not present

## 2024-05-17 MED ORDER — IRON SUCROSE 20 MG/ML IV SOLN
200.0000 mg | Freq: Once | INTRAVENOUS | Status: AC
  Administered 2024-05-17: 200 mg via INTRAVENOUS
  Filled 2024-05-17: qty 10

## 2024-05-17 NOTE — Progress Notes (Signed)
 Diagnosis: Acute Anemia  Provider:  Chilton Greathouse MD  Procedure: IV Push  IV Type: Peripheral, IV Location: L Antecubital  Venofer (Iron Sucrose), Dose: 200 mg  Post Infusion IV Care: Observation period completed and Peripheral IV Discontinued  Discharge: Condition: Good, Destination: Home . AVS Declined  Performed by:  Wyvonne Lenz, RN

## 2024-05-19 ENCOUNTER — Ambulatory Visit

## 2024-05-19 VITALS — BP 92/56 | HR 87 | Temp 97.8°F | Resp 16 | Ht 67.0 in | Wt 192.0 lb

## 2024-05-19 DIAGNOSIS — D649 Anemia, unspecified: Secondary | ICD-10-CM

## 2024-05-19 DIAGNOSIS — Z3A30 30 weeks gestation of pregnancy: Secondary | ICD-10-CM

## 2024-05-19 DIAGNOSIS — O99013 Anemia complicating pregnancy, third trimester: Secondary | ICD-10-CM

## 2024-05-19 MED ORDER — IRON SUCROSE 20 MG/ML IV SOLN
200.0000 mg | Freq: Once | INTRAVENOUS | Status: AC
  Administered 2024-05-19: 200 mg via INTRAVENOUS
  Filled 2024-05-19: qty 10

## 2024-05-19 NOTE — Progress Notes (Signed)
 Diagnosis: Iron  Deficiency Anemia  Provider:  Praveen Mannam MD  Procedure: IV Push  IV Type: Peripheral, IV Location: L Antecubital  Venofer  (Iron  Sucrose), Dose: 200 mg  Post Infusion IV Care: Observation period completed and Peripheral IV Discontinued  Discharge: Condition: Good, Destination: Home . AVS Declined  Performed by:  Maximiano JONELLE Pouch, LPN

## 2024-05-21 ENCOUNTER — Ambulatory Visit (INDEPENDENT_AMBULATORY_CARE_PROVIDER_SITE_OTHER)

## 2024-05-21 VITALS — BP 94/57 | HR 84 | Temp 98.5°F | Resp 18 | Ht 67.0 in | Wt 192.8 lb

## 2024-05-21 DIAGNOSIS — D649 Anemia, unspecified: Secondary | ICD-10-CM | POA: Diagnosis not present

## 2024-05-21 DIAGNOSIS — O99013 Anemia complicating pregnancy, third trimester: Secondary | ICD-10-CM | POA: Diagnosis not present

## 2024-05-21 DIAGNOSIS — Z3A30 30 weeks gestation of pregnancy: Secondary | ICD-10-CM

## 2024-05-21 MED ORDER — IRON SUCROSE 20 MG/ML IV SOLN
200.0000 mg | Freq: Once | INTRAVENOUS | Status: AC
  Administered 2024-05-21: 200 mg via INTRAVENOUS
  Filled 2024-05-21: qty 10

## 2024-05-21 NOTE — Progress Notes (Signed)
 Diagnosis: Iron Deficiency Anemia  Provider:  Chilton Greathouse MD  Procedure: IV Push  IV Type: Peripheral, IV Location: L Antecubital  Venofer (Iron Sucrose), Dose: 200 mg  Post Infusion IV Care: Observation period completed and Peripheral IV Discontinued  Discharge: Condition: Good, Destination: Home . AVS Declined  Performed by:  Adriana Mccallum, RN

## 2024-05-24 ENCOUNTER — Ambulatory Visit

## 2024-05-24 MED ORDER — IRON SUCROSE 20 MG/ML IV SOLN: 200.0000 mg | Freq: Once | INTRAVENOUS | Status: DC

## 2024-05-26 ENCOUNTER — Ambulatory Visit (INDEPENDENT_AMBULATORY_CARE_PROVIDER_SITE_OTHER)

## 2024-05-26 VITALS — BP 100/62 | HR 83 | Temp 97.9°F | Resp 16

## 2024-05-26 DIAGNOSIS — Z3A31 31 weeks gestation of pregnancy: Secondary | ICD-10-CM | POA: Diagnosis not present

## 2024-05-26 DIAGNOSIS — D649 Anemia, unspecified: Secondary | ICD-10-CM

## 2024-05-26 DIAGNOSIS — O99013 Anemia complicating pregnancy, third trimester: Secondary | ICD-10-CM | POA: Diagnosis not present

## 2024-05-26 MED ORDER — IRON SUCROSE 20 MG/ML IV SOLN
200.0000 mg | Freq: Once | INTRAVENOUS | Status: AC
  Administered 2024-05-26: 200 mg via INTRAVENOUS
  Filled 2024-05-26: qty 10

## 2024-05-26 NOTE — Progress Notes (Signed)
 Diagnosis: Acute Anemia  Provider:  Chilton Greathouse MD  Procedure: IV Push  IV Type: Peripheral, IV Location: L Antecubital  Venofer (Iron Sucrose), Dose: 200 mg  Post Infusion IV Care: Observation period completed and Peripheral IV Discontinued  Discharge: Condition: Stable, Destination: Home . AVS Declined  Performed by:  Wyvonne Lenz, RN

## 2024-06-04 ENCOUNTER — Ambulatory Visit (INDEPENDENT_AMBULATORY_CARE_PROVIDER_SITE_OTHER): Admitting: Family Medicine

## 2024-06-04 VITALS — BP 99/65 | HR 91 | Wt 202.0 lb

## 2024-06-04 DIAGNOSIS — O09891 Supervision of other high risk pregnancies, first trimester: Secondary | ICD-10-CM

## 2024-06-04 DIAGNOSIS — O99013 Anemia complicating pregnancy, third trimester: Secondary | ICD-10-CM | POA: Diagnosis not present

## 2024-06-04 DIAGNOSIS — O09893 Supervision of other high risk pregnancies, third trimester: Secondary | ICD-10-CM

## 2024-06-04 DIAGNOSIS — Z3A32 32 weeks gestation of pregnancy: Secondary | ICD-10-CM

## 2024-06-04 DIAGNOSIS — Z789 Other specified health status: Secondary | ICD-10-CM | POA: Diagnosis not present

## 2024-06-04 DIAGNOSIS — Z3480 Encounter for supervision of other normal pregnancy, unspecified trimester: Secondary | ICD-10-CM

## 2024-06-04 MED ORDER — FERRIC MALTOL 30 MG PO CAPS: 30.0000 mg | ORAL_CAPSULE | ORAL | 1 refills | Status: DC

## 2024-06-04 NOTE — Progress Notes (Signed)
   PRENATAL VISIT NOTE  Subjective:  Kelly [name] is a 33 y.o. G3P1011 at [redacted]w[redacted]d being seen today for ongoing prenatal care.  She is currently monitored for the following issues for this low-risk pregnancy and has Blood product declined; Supervision of other normal pregnancy, antepartum; Short interval between pregnancies affecting pregnancy in first trimester, antepartum; Alpha thalassemia silent carrier; and Anemia affecting pregnancy in third trimester on their problem list.  Patient reports no complaints.  Contractions: Irritability. Vag. Bleeding: None.  Movement: Present. Denies leaking of fluid.   The following portions of the patient's history were reviewed and updated as appropriate: allergies, current medications, past family history, past medical history, past social history, past surgical history and problem list.   Objective:    Vitals:   06/04/24 1109  BP: 99/65  Pulse: 91  Weight: 202 lb (91.6 kg)    Fetal Status:  Fetal Heart Rate (bpm): 156   Movement: Present    General: Alert, oriented and cooperative. Patient is in no acute distress.  Skin: Skin is warm and dry. No rash noted.   Cardiovascular: Normal heart rate noted  Respiratory: Normal respiratory effort, no problems with respiration noted  Abdomen: Soft, gravid, appropriate for gestational age.  Pain/Pressure: Present     Pelvic: Cervical exam deferred        Extremities: Normal range of motion.  Edema: Mild pitting, slight indentation  Mental Status: Normal mood and affect. Normal behavior. Normal judgment and thought content.   Assessment and Plan:  Pregnancy: G3P1011 at [redacted]w[redacted]d 1. Supervision of other normal pregnancy, antepartum (Primary) Up to date No concerns today Vigorous movement FH appropriate Recently was fired from job due to her not having childcare and her infant being at home with her  Considering BTS-- has [employer] through [employer] work. Reviewed ligation options - clips,  partial and full salpingectomy. Also reviewed vasectomy options Reviewed permanent nature of both methods  2. Anemia affecting pregnancy in third trimester S/p Venofer  x5 Repeat CBC in 2 weeks (at 34 week) Continue oral Fe  3. Short interval between pregnancies affecting pregnancy in first trimester, antepartum  4. Blood product declined Venofer  given  5. [redacted] weeks gestation of pregnancy   Preterm labor symptoms and general obstetric precautions including but not limited to vaginal bleeding, contractions, leaking of fluid and fetal movement were reviewed in detail with the patient. Please refer to After Visit Summary for other counseling recommendations.   Return in about 2 weeks (around 06/18/2024) for Routine prenatal care.  Future Appointments  Date Time Provider Department Center  06/18/2024  8:15 AM [facility]  07/01/2024  8:35 AM Fredirick Glenys RAMAN, MD [facility]  07/16/2024  8:55 AM Anyanwu, Gloris LABOR, MD [facility]  07/23/2024  9:35 AM Eldonna Suzen Octave, MD [facility]    Suzen Octave Eldonna, MD

## 2024-06-04 NOTE — Progress Notes (Signed)
 ROB   Pt asked if she can have labs done today to check iron  levels.  Also asked if she needed to start oral iron  since she has been getting infusions.   Last infusion was 05/26/24.

## 2024-06-05 ENCOUNTER — Other Ambulatory Visit: Payer: Self-pay | Admitting: Family Medicine

## 2024-06-05 DIAGNOSIS — O99013 Anemia complicating pregnancy, third trimester: Secondary | ICD-10-CM

## 2024-06-07 ENCOUNTER — Other Ambulatory Visit: Payer: Self-pay | Admitting: *Deleted

## 2024-06-07 DIAGNOSIS — O99013 Anemia complicating pregnancy, third trimester: Secondary | ICD-10-CM

## 2024-06-07 MED ORDER — ACCRUFER 30 MG PO CAPS
30.0000 mg | ORAL_CAPSULE | ORAL | 1 refills | Status: DC
Start: 2024-06-07 — End: 2024-07-01

## 2024-06-18 ENCOUNTER — Other Ambulatory Visit

## 2024-06-18 DIAGNOSIS — O99013 Anemia complicating pregnancy, third trimester: Secondary | ICD-10-CM

## 2024-06-18 LAB — CBC
Hematocrit: 30.4 % — ABNORMAL LOW (ref 34.0–46.6)
Hemoglobin: 10.1 g/dL — ABNORMAL LOW (ref 11.1–15.9)
MCH: 29.8 pg (ref 26.6–33.0)
MCHC: 33.2 g/dL (ref 31.5–35.7)
MCV: 90 fL (ref 79–97)
Platelets: 195 x10E3/uL (ref 150–450)
RBC: 3.39 x10E6/uL — ABNORMAL LOW (ref 3.77–5.28)
RDW: 13.7 % (ref 11.7–15.4)
WBC: 8.6 x10E3/uL (ref 3.4–10.8)

## 2024-06-24 ENCOUNTER — Ambulatory Visit: Payer: Self-pay | Admitting: Family Medicine

## 2024-07-01 ENCOUNTER — Other Ambulatory Visit: Payer: Self-pay

## 2024-07-01 ENCOUNTER — Encounter (HOSPITAL_COMMUNITY): Payer: Self-pay | Admitting: Obstetrics and Gynecology

## 2024-07-01 ENCOUNTER — Ambulatory Visit (INDEPENDENT_AMBULATORY_CARE_PROVIDER_SITE_OTHER): Admitting: Family Medicine

## 2024-07-01 ENCOUNTER — Other Ambulatory Visit (HOSPITAL_COMMUNITY)
Admission: RE | Admit: 2024-07-01 | Discharge: 2024-07-01 | Disposition: A | Discharge status: Home / Self Care | Source: Ambulatory Visit | Attending: Family Medicine | Admitting: Family Medicine

## 2024-07-01 ENCOUNTER — Inpatient Hospital Stay (HOSPITAL_COMMUNITY)
Admission: AD | Admit: 2024-07-01 | Discharge: 2024-07-01 | Disposition: A | Discharge status: Home / Self Care | Attending: Obstetrics and Gynecology | Admitting: Obstetrics and Gynecology

## 2024-07-01 VITALS — BP 112/73 | HR 82 | Wt 206.0 lb

## 2024-07-01 DIAGNOSIS — O99013 Anemia complicating pregnancy, third trimester: Secondary | ICD-10-CM

## 2024-07-01 DIAGNOSIS — Z113 Encounter for screening for infections with a predominantly sexual mode of transmission: Secondary | ICD-10-CM | POA: Diagnosis not present

## 2024-07-01 DIAGNOSIS — Z3A36 36 weeks gestation of pregnancy: Secondary | ICD-10-CM | POA: Diagnosis not present

## 2024-07-01 DIAGNOSIS — Z0371 Encounter for suspected problem with amniotic cavity and membrane ruled out: Secondary | ICD-10-CM | POA: Diagnosis present

## 2024-07-01 DIAGNOSIS — O09893 Supervision of other high risk pregnancies, third trimester: Secondary | ICD-10-CM

## 2024-07-01 DIAGNOSIS — Z3685 Encounter for antenatal screening for Streptococcus B: Secondary | ICD-10-CM

## 2024-07-01 DIAGNOSIS — Z3689 Encounter for other specified antenatal screening: Secondary | ICD-10-CM

## 2024-07-01 DIAGNOSIS — Z789 Other specified health status: Secondary | ICD-10-CM | POA: Diagnosis not present

## 2024-07-01 DIAGNOSIS — O09891 Supervision of other high risk pregnancies, first trimester: Secondary | ICD-10-CM

## 2024-07-01 DIAGNOSIS — O4703 False labor before 37 completed weeks of gestation, third trimester: Secondary | ICD-10-CM | POA: Diagnosis not present

## 2024-07-01 DIAGNOSIS — Z3480 Encounter for supervision of other normal pregnancy, unspecified trimester: Secondary | ICD-10-CM

## 2024-07-01 LAB — POCT FERN TEST: POCT Fern Test: NEGATIVE

## 2024-07-01 NOTE — MAU Provider Note (Signed)
 Chief Complaint:  Rupture of Membranes and Contractions   HPI    Kelly [name] is a 33 y.o. G3P1011 at [redacted]w[redacted]d who presents to maternity admissions reporting she went for routine OB appointment  today and had a cervical exam 2.5/50-2.  Since the exam patient states she started to noticed some increased vaginal discharge vs LOF and is having irregular contractions every 10 minutes with pelvic pressure.  She denies any vaginal bleeding, reports good fetal movements  Pregnancy Course: Correne Beagle  Past Medical History:  Diagnosis Date   Acute medial meniscus tear of left knee 06/20/2020   Adjustment disorder with mixed anxiety and depressed mood 04/12/2014   ASCUS with positive high risk HPV cervical 05/13/2014   2015: Pap ASCUS + HR HPV  2015: Colpo CIN 1  2024 Nml   OB History  Gravida Para Term Preterm AB Living  3 1 1  0 1 1  SAB IAB Ectopic Multiple Live Births  1 0 0 0 1    # Outcome Date GA Lbr Len/2nd Weight Sex Type Anes PTL Lv  3 Current           2 Term 05/17/23 [redacted]w[redacted]d 10:11 / 00:36 2810 g M Vag-Spont EPI  LIV  1 SAB 2014           Past Surgical History:  Procedure Laterality Date   KNEE ARTHROSCOPY WITH ANTERIOR CRUCIATE LIGAMENT (ACL) REPAIR WITH HAMSTRING GRAFT Left 06/30/2020   Procedure: LEFT KNEE ARTHROSCOPY WITH ANTERIOR CRUCIATE LIGAMENT (ACL) RECONSTRUCTION, PARTIAL MEDIAL MENISCUS REPAIR;  Surgeon: Jerri Kay HERO, MD;  Location: [facility];  Service: Orthopedics;  Laterality: Left;   Family History  Problem Relation Age of Onset   Hypertension Mother    Diabetes Father    Hypertension Brother    Breast cancer Maternal Grandmother 77   Pancreatic cancer Maternal Grandmother    Social History   Tobacco Use   Smoking status: Never   Smokeless tobacco: Never  Vaping Use   Vaping status: Never Used  Substance Use Topics   Alcohol use: Not Currently    Comment: occas, prior to pregnancy   Drug use: Never   No Known Allergies No medications  prior to admission.    I have reviewed patient's Past Medical Hx, Surgical Hx, Family Hx, Social Hx, medications and allergies.   ROS  Pertinent items noted in HPI and remainder of comprehensive ROS otherwise negative.   PHYSICAL EXAM  Patient Vitals for the past 24 hrs:  BP Temp Temp src Pulse Resp SpO2 Height Weight  07/01/24 1630 116/67 -- -- 68 -- 100 % -- --  07/01/24 1424 110/76 98.6 F (37 C) Oral 68 16 100 % 5' 7 (1.702 m) 92.6 kg    Constitutional: Well-developed, obese  female in no acute distress.  Cardiovascular: normal rate & rhythm, warm and well-perfused Respiratory: normal effort, no problems with respiration noted GI: Abd soft, non-tender, gravid MS: Extremities nontender, no edema, normal ROM Neurologic: Alert and oriented x 4.  GU: no CVA tenderness Pelvic: Chaperoned by Camie House RN SVE: 2/60/-3   Fetal Tracing: Baseline: 130 Variability: Moderate  Accelerations: present Decelerations: absent Toco: irregular    Labs: Results for orders placed or performed during the hospital encounter of 07/01/24 (from the past 24 hours)  Fern Test     Status: None   Collection Time: 07/01/24  3:52 PM  Result Value Ref Range   POCT Fern Test Negative = intact amniotic membranes  Imaging:  No results found.  MDM & MAU COURSE  MDM:  HIGH  R/O Labor   Prenatal chart reviewed Physical exam performed with pelvic ( No cervical change since this morning in the office) Fern test: Negative NST for gestational age and reassurance (reactive) No evidence of SROM or active labor at this time based on negative Fern and no cervical change   MAU Course: Orders Placed This Encounter  Procedures   Fern Test   Discharge patient Discharge disposition: 01-Home or Self Care; Discharge patient date: 07/01/2024   I have reviewed the patient chart and performed the physical exam . I have ordered & interpreted the lab results and reviewed and interpreted the  NST Medications ordered as stated below.  A/P as described below.  Counseling and education provided and patient agreeable  with plan as described below. Verbalized understanding.    ASSESSMENT   1. False labor before 37 completed weeks of gestation in third trimester   2. [redacted] weeks gestation of pregnancy   3. NST (non-stress test) reactive on fetal surveillance     PLAN  Discharge home in stable condition with return precautions.   F/U with OB as scheduled  See AVS for full description of information given to the patient including both verbal and written. Patient verbalized understanding and agrees with the plan as described above.     Follow-up Information     [facility] Follow up.   Specialty: Obstetrics and Gynecology Why: If symptoms worsen or fail to resolve, As scheduled for ongoing prenatal care Contact information: [address] [place]  [address] [phone]                Allergies as of 07/01/2024   No Known Allergies      Medication List     TAKE these medications    Iron  28 MG Tabs Take 28 mg by mouth daily.   multivitamin-prenatal 27-0.8 MG Tabs tablet Take 1 tablet by mouth daily at 12 noon.        Olam Dalton, MSN, [facility], Center for [facility]    This chart was dictated using voice recognition software, Dragon. Despite the best efforts of this provider to proofread and correct errors, errors may still occur which can change documentation meaning.

## 2024-07-01 NOTE — Progress Notes (Signed)
   PRENATAL VISIT NOTE  Subjective:  Kelly [name] is a 33 y.o. G3P1011 at [redacted]w[redacted]d being seen today for ongoing prenatal care.  She is currently monitored for the following issues for this low-risk pregnancy and has Blood product declined; Supervision of other normal pregnancy, antepartum; Short interval between pregnancies affecting pregnancy in first trimester, antepartum; Alpha thalassemia silent carrier; and Anemia affecting pregnancy in third trimester on their problem list.  Patient reports pelvic pressure.  Contractions: Irritability. Vag. Bleeding: None.  Movement: Present. Denies leaking of fluid.   The following portions of the patient's history were reviewed and updated as appropriate: allergies, current medications, past family history, past medical history, past social history, past surgical history and problem list.   Objective:    Vitals:   07/01/24 0839  BP: 112/73  Pulse: 82  Weight: 206 lb (93.4 kg)    Fetal Status:  Fetal Heart Rate (bpm): 144 Fundal Height: 33 cm Movement: Present Presentation: Vertex  General: Alert, oriented and cooperative. Patient is in no acute distress.  Skin: Skin is warm and dry. No rash noted.   Cardiovascular: Normal heart rate noted  Respiratory: Normal respiratory effort, no problems with respiration noted  Abdomen: Soft, gravid, appropriate for gestational age.  Pain/Pressure: Present     Pelvic: Cervical exam deferred Dilation: 2.5 Effacement (%): 50, 60 Station: -2  Extremities: Normal range of motion.     Mental Status: Normal mood and affect. Normal behavior. Normal judgment and thought content.   Assessment and Plan:  Pregnancy: G3P1011 at [redacted]w[redacted]d 1. Supervision of other normal pregnancy, antepartum (Primary) Continue prenatal care.  2. Short interval between pregnancies affecting pregnancy in first trimester, antepartum   3. Blood product declined Last Hgb improved and > 10  4. Anemia affecting pregnancy in third  trimester   5. Routine screening for STI (sexually transmitted infection) - Cervicovaginal ancillary only( [place])  6. Antenatal screening for streptococcus B - Culture, beta strep (group b only)  7. [redacted] weeks gestation of pregnancy   Preterm labor symptoms and general obstetric precautions including but not limited to vaginal bleeding, contractions, leaking of fluid and fetal movement were reviewed in detail with the patient. Please refer to After Visit Summary for other counseling recommendations.   Return in about 1 week (around 07/08/2024) for [facility].  Future Appointments  Date Time Provider Department Center  07/16/2024  8:55 AM Anyanwu, Gloris LABOR, MD [facility]  07/23/2024  9:35 AM Eldonna Suzen Octave, MD [facility]    Glenys GORMAN Birk, MD

## 2024-07-01 NOTE — MAU Note (Signed)
 Kelly [name] is a 33 y.o. at [redacted]w[redacted]d here in MAU reporting: went to appt today.  Was 2/60.  Noted a slow leakage of fluids since then.  Is having contractions every 10 min and feeling a lot of pressure.  Onset of complaint: 1000 Pain score: 6 Vitals:   07/01/24 1424  BP: 110/76  Pulse: 68  Resp: 16  Temp: 98.6 F (37 C)  SpO2: 100%     FHT:134 Lab orders placed from triage:

## 2024-07-02 LAB — CERVICOVAGINAL ANCILLARY ONLY
Chlamydia: NEGATIVE
Comment: NEGATIVE
Comment: NORMAL
Neisseria Gonorrhea: NEGATIVE

## 2024-07-05 ENCOUNTER — Ambulatory Visit: Payer: Self-pay | Admitting: Family Medicine

## 2024-07-05 DIAGNOSIS — Z3480 Encounter for supervision of other normal pregnancy, unspecified trimester: Secondary | ICD-10-CM

## 2024-07-05 LAB — CULTURE, BETA STREP (GROUP B ONLY): Strep Gp B Culture: NEGATIVE

## 2024-07-16 ENCOUNTER — Telehealth: Admitting: Obstetrics & Gynecology

## 2024-07-16 ENCOUNTER — Encounter: Payer: Self-pay | Admitting: Obstetrics & Gynecology

## 2024-07-16 VITALS — BP 117/64 | HR 84 | Wt 204.3 lb

## 2024-07-16 DIAGNOSIS — Z3A38 38 weeks gestation of pregnancy: Secondary | ICD-10-CM | POA: Diagnosis not present

## 2024-07-16 DIAGNOSIS — Z3482 Encounter for supervision of other normal pregnancy, second trimester: Secondary | ICD-10-CM

## 2024-07-16 DIAGNOSIS — Z3480 Encounter for supervision of other normal pregnancy, unspecified trimester: Secondary | ICD-10-CM

## 2024-07-16 NOTE — Patient Instructions (Signed)

## 2024-07-16 NOTE — Progress Notes (Signed)
   OBSTETRICS PRENATAL VIRTUAL VISIT ENCOUNTER NOTE  Provider location: Center for [facility] at [facility]   Patient location: Home  I connected with Kelly [name] on 07/16/24 at  8:55 AM EDT by MyChart Video Encounter and verified that I am speaking with the correct person using two identifiers. I discussed the limitations, risks, security and privacy concerns of performing an evaluation and management service virtually and the availability of in person appointments. I also discussed with the patient that there may be a patient responsible charge related to this service. The patient expressed understanding and agreed to proceed. Subjective:  Kelly [name] is a 33 y.o. G3P1011 at [redacted]w[redacted]d being seen today for ongoing prenatal care.  She is currently monitored for the following issues for this low-risk pregnancy and has Blood product declined; Supervision of other normal pregnancy, antepartum; Short interval between pregnancies affecting pregnancy in first trimester, antepartum; Alpha thalassemia silent carrier; and Anemia affecting pregnancy in third trimester on their problem list.  Patient reports no complaints.  Contractions: Irritability. Vag. Bleeding: None.  Movement: Present. Denies any leaking of fluid.   The following portions of the patient's history were reviewed and updated as appropriate: allergies, current medications, past family history, past medical history, past social history, past surgical history and problem list.   Objective:    Vitals:   07/16/24 0854  BP: 117/64  Pulse: 84  Weight: 204 lb 4.8 oz (92.7 kg)    Fetal Status:      Movement: Present    General: Alert, oriented and cooperative. Patient is in no acute distress.  Respiratory: Normal respiratory effort, no problems with respiration noted  Mental Status: Normal mood and affect. Normal behavior. Normal judgment and thought content.  Rest of physical exam deferred due to type of  encounter  Imaging: No results found.  Assessment and Plan:  Pregnancy: G3P1011 at [redacted]w[redacted]d 1. [redacted] weeks gestation of pregnancy 2. Supervision of other normal pregnancy, antepartum (Primary) No concerns.  Desires membrane sweep next appointment. Term labor symptoms and general obstetric precautions including but not limited to vaginal bleeding, contractions, leaking of fluid and fetal movement were reviewed in detail with the patient. I discussed the assessment and treatment plan with the patient. The patient was provided an opportunity to ask questions and all were answered. The patient agreed with the plan and demonstrated an understanding of the instructions. The patient was advised to call back or seek an in-person office evaluation/go to MAU at [facility] for any urgent or concerning symptoms. Please refer to After Visit Summary for other counseling recommendations.   I provided 11 minutes of face-to-face time during this encounter.  Return in about 1 week (around 07/23/2024) for OFFICE OB VISIT (MD only).  Future Appointments  Date Time Provider Department Center  07/23/2024  9:35 AM Eldonna Suzen Octave, MD [facility]    Gloris Hugger, MD Center for [facility], [facility]

## 2024-07-23 ENCOUNTER — Inpatient Hospital Stay (HOSPITAL_COMMUNITY): Admitting: Anesthesiology

## 2024-07-23 ENCOUNTER — Inpatient Hospital Stay (HOSPITAL_COMMUNITY)
Admission: AD | Admit: 2024-07-23 | Discharge: 2024-07-25 | DRG: 768 | Disposition: A | Discharge status: Home / Self Care | Attending: Obstetrics and Gynecology | Admitting: Obstetrics and Gynecology

## 2024-07-23 ENCOUNTER — Encounter: Admitting: Family Medicine

## 2024-07-23 ENCOUNTER — Encounter (HOSPITAL_COMMUNITY)
Admission: AD | Disposition: A | Discharge status: Home / Self Care | Payer: Self-pay | Source: Home / Self Care | Attending: Obstetrics and Gynecology

## 2024-07-23 ENCOUNTER — Encounter (HOSPITAL_COMMUNITY): Payer: Self-pay | Admitting: Obstetrics and Gynecology

## 2024-07-23 DIAGNOSIS — D62 Acute posthemorrhagic anemia: Secondary | ICD-10-CM | POA: Diagnosis not present

## 2024-07-23 DIAGNOSIS — Z3493 Encounter for supervision of normal pregnancy, unspecified, third trimester: Secondary | ICD-10-CM

## 2024-07-23 DIAGNOSIS — O9081 Anemia of the puerperium: Secondary | ICD-10-CM | POA: Diagnosis not present

## 2024-07-23 DIAGNOSIS — Z3480 Encounter for supervision of other normal pregnancy, unspecified trimester: Principal | ICD-10-CM

## 2024-07-23 DIAGNOSIS — Z8249 Family history of ischemic heart disease and other diseases of the circulatory system: Secondary | ICD-10-CM | POA: Diagnosis not present

## 2024-07-23 DIAGNOSIS — Z833 Family history of diabetes mellitus: Secondary | ICD-10-CM | POA: Diagnosis not present

## 2024-07-23 DIAGNOSIS — O26893 Other specified pregnancy related conditions, third trimester: Secondary | ICD-10-CM | POA: Diagnosis present

## 2024-07-23 DIAGNOSIS — O99013 Anemia complicating pregnancy, third trimester: Secondary | ICD-10-CM

## 2024-07-23 DIAGNOSIS — Z3A39 39 weeks gestation of pregnancy: Secondary | ICD-10-CM

## 2024-07-23 HISTORY — PX: DILATION AND CURETTAGE OF UTERUS: SHX78

## 2024-07-23 LAB — CBC
HCT: 32.1 % — ABNORMAL LOW (ref 36.0–46.0)
HCT: 26.4 % — ABNORMAL LOW (ref 36.0–46.0)
HCT: 26.4 % — ABNORMAL LOW (ref 36.0–46.0)
Hemoglobin: 10.6 g/dL — ABNORMAL LOW (ref 12.0–15.0)
Hemoglobin: 8.7 g/dL — ABNORMAL LOW (ref 12.0–15.0)
Hemoglobin: 8.9 g/dL — ABNORMAL LOW (ref 12.0–15.0)
MCH: 29.2 pg (ref 26.0–34.0)
MCH: 29.1 pg (ref 26.0–34.0)
MCH: 29 pg (ref 26.0–34.0)
MCHC: 33 g/dL (ref 30.0–36.0)
MCHC: 33 g/dL (ref 30.0–36.0)
MCHC: 33.7 g/dL (ref 30.0–36.0)
MCV: 88.4 fL (ref 80.0–100.0)
MCV: 88.3 fL (ref 80.0–100.0)
MCV: 86 fL (ref 80.0–100.0)
Platelets: 193 K/uL (ref 150–400)
Platelets: 187 K/uL (ref 150–400)
Platelets: 201 K/uL (ref 150–400)
RBC: 3.63 MIL/uL — ABNORMAL LOW (ref 3.87–5.11)
RBC: 2.99 MIL/uL — ABNORMAL LOW (ref 3.87–5.11)
RBC: 3.07 MIL/uL — ABNORMAL LOW (ref 3.87–5.11)
RDW: 13.3 % (ref 11.5–15.5)
RDW: 13.2 % (ref 11.5–15.5)
RDW: 13.2 % (ref 11.5–15.5)
WBC: 9.7 K/uL (ref 4.0–10.5)
WBC: 12.2 K/uL — ABNORMAL HIGH (ref 4.0–10.5)
WBC: 19.8 K/uL — ABNORMAL HIGH (ref 4.0–10.5)
nRBC: 0 % (ref 0.0–0.2)
nRBC: 0 % (ref 0.0–0.2)
nRBC: 0 % (ref 0.0–0.2)

## 2024-07-23 LAB — DIC (DISSEMINATED INTRAVASCULAR COAGULATION)PANEL
D-Dimer, Quant: 2.56 ug{FEU}/mL — ABNORMAL HIGH (ref 0.00–0.50)
D-Dimer, Quant: 2.05 ug{FEU}/mL — ABNORMAL HIGH (ref 0.00–0.50)
Fibrinogen: 459 mg/dL (ref 210–475)
Fibrinogen: 344 mg/dL (ref 210–475)
INR: 1 (ref 0.8–1.2)
INR: 1.1 (ref 0.8–1.2)
Platelets: 204 K/uL (ref 150–400)
Platelets: 198 K/uL (ref 150–400)
Prothrombin Time: 13.8 s (ref 11.4–15.2)
Prothrombin Time: 14.6 s (ref 11.4–15.2)
Smear Review: NONE SEEN
Smear Review: NONE SEEN
aPTT: 25 s (ref 24–36)
aPTT: 26 s (ref 24–36)

## 2024-07-23 LAB — NO BLOOD PRODUCTS

## 2024-07-23 LAB — RPR: RPR Ser Ql: NONREACTIVE

## 2024-07-23 SURGERY — DILATION AND CURETTAGE
Anesthesia: General

## 2024-07-23 MED ORDER — OXYCODONE HCL 5 MG PO TABS
5.0000 mg | ORAL_TABLET | ORAL | Status: DC | PRN
  Administered 2024-07-25 (×2): 5 mg via ORAL
  Filled 2024-07-23 (×2): qty 1

## 2024-07-23 MED ORDER — OXYCODONE HCL 5 MG PO TABS: 5.0000 mg | ORAL_TABLET | Freq: Once | ORAL | Status: DC | PRN

## 2024-07-23 MED ORDER — PHENYLEPHRINE HCL (PRESSORS) 10 MG/ML IV SOLN
INTRAVENOUS | Status: DC | PRN
  Administered 2024-07-23 (×4): 80 mg via INTRAVENOUS

## 2024-07-23 MED ORDER — MIDAZOLAM HCL (PF) 2 MG/2ML IJ SOLN
INTRAMUSCULAR | Status: DC | PRN
  Administered 2024-07-23: 2 mg via INTRAVENOUS

## 2024-07-23 MED ORDER — OXYTOCIN 10 UNIT/ML IJ SOLN
10.0000 [IU] | Freq: Once | INTRAMUSCULAR | Status: DC
Start: 2024-07-23 — End: 2024-07-23

## 2024-07-23 MED ORDER — FENTANYL CITRATE (PF) 100 MCG/2ML IJ SOLN
INTRAMUSCULAR | Status: AC
  Filled 2024-07-23: qty 2

## 2024-07-23 MED ORDER — LACTATED RINGERS IV SOLN: 500.0000 mL | INTRAVENOUS | Status: DC | PRN

## 2024-07-23 MED ORDER — SOD CITRATE-CITRIC ACID 500-334 MG/5ML PO SOLN: 30.0000 mL | ORAL | Status: DC

## 2024-07-23 MED ORDER — CEFAZOLIN SODIUM-DEXTROSE 2-4 GM/100ML-% IV SOLN
2.0000 g | Freq: Three times a day (TID) | INTRAVENOUS | Status: AC
  Administered 2024-07-23 (×2): 2 g via INTRAVENOUS
  Filled 2024-07-23 (×2): qty 100

## 2024-07-23 MED ORDER — PROPOFOL 10 MG/ML IV BOLUS
INTRAVENOUS | Status: AC
  Filled 2024-07-23: qty 20

## 2024-07-23 MED ORDER — FENTANYL CITRATE (PF) 100 MCG/2ML IJ SOLN
INTRAMUSCULAR | Status: AC
  Administered 2024-07-23: 100 ug
  Filled 2024-07-23: qty 2

## 2024-07-23 MED ORDER — OXYCODONE HCL 5 MG/5ML PO SOLN: 5.0000 mg | Freq: Once | ORAL | Status: DC | PRN

## 2024-07-23 MED ORDER — FENTANYL CITRATE (PF) 100 MCG/2ML IJ SOLN
INTRAMUSCULAR | Status: DC | PRN
  Administered 2024-07-23: 100 ug via INTRAVENOUS

## 2024-07-23 MED ORDER — OXYTOCIN 10 UNIT/ML IJ SOLN
INTRAMUSCULAR | Status: AC
  Administered 2024-07-23: 10 [IU]
  Filled 2024-07-23: qty 1

## 2024-07-23 MED ORDER — AMISULPRIDE (ANTIEMETIC) 5 MG/2ML IV SOLN: 10.0000 mg | Freq: Once | INTRAVENOUS | Status: DC | PRN

## 2024-07-23 MED ORDER — OXYTOCIN-SODIUM CHLORIDE 30-0.9 UT/500ML-% IV SOLN
2.5000 [IU]/h | INTRAVENOUS | Status: DC
  Administered 2024-07-23: 2.5 [IU]/h via INTRAVENOUS

## 2024-07-23 MED ORDER — TETANUS-DIPHTH-ACELL PERTUSSIS 5-2-15.5 LF-MCG/0.5 IM SUSP: 0.5000 mL | Freq: Once | INTRAMUSCULAR | Status: DC

## 2024-07-23 MED ORDER — IBUPROFEN 600 MG PO TABS
600.0000 mg | ORAL_TABLET | Freq: Four times a day (QID) | ORAL | Status: DC
  Administered 2024-07-23 – 2024-07-25 (×8): 600 mg via ORAL
  Filled 2024-07-23 (×8): qty 1

## 2024-07-23 MED ORDER — MENTHOL 3 MG MT LOZG
1.0000 | LOZENGE | OROMUCOSAL | Status: DC | PRN
  Administered 2024-07-23: 3 mg via ORAL
  Filled 2024-07-23: qty 9

## 2024-07-23 MED ORDER — MISOPROSTOL 200 MCG PO TABS
ORAL_TABLET | ORAL | Status: AC
  Administered 2024-07-23: 800 ug
  Filled 2024-07-23: qty 4

## 2024-07-23 MED ORDER — SIMETHICONE 80 MG PO CHEW: 80.0000 mg | CHEWABLE_TABLET | ORAL | Status: DC | PRN

## 2024-07-23 MED ORDER — OXYTOCIN BOLUS FROM INFUSION
333.0000 mL | Freq: Once | INTRAVENOUS | Status: AC
  Administered 2024-07-23: 333 mL via INTRAVENOUS

## 2024-07-23 MED ORDER — LACTATED RINGERS IV SOLN: Freq: Once | INTRAVENOUS | Status: AC

## 2024-07-23 MED ORDER — METHYLERGONOVINE MALEATE 0.2 MG/ML IJ SOLN
INTRAMUSCULAR | Status: AC
Start: 2024-07-23 — End: 2024-07-23
  Administered 2024-07-23: 0.2 mg
  Filled 2024-07-23: qty 1

## 2024-07-23 MED ORDER — MISOPROSTOL 200 MCG PO TABS
800.0000 ug | ORAL_TABLET | Freq: Once | ORAL | Status: DC
Start: 2024-07-23 — End: 2024-07-23

## 2024-07-23 MED ORDER — ONDANSETRON HCL 4 MG/2ML IJ SOLN: 4.0000 mg | INTRAMUSCULAR | Status: DC | PRN

## 2024-07-23 MED ORDER — ACETAMINOPHEN 10 MG/ML IV SOLN
INTRAVENOUS | Status: DC | PRN
  Administered 2024-07-23: 1000 mg via INTRAVENOUS

## 2024-07-23 MED ORDER — SUCCINYLCHOLINE CHLORIDE 200 MG/10ML IV SOSY
PREFILLED_SYRINGE | INTRAVENOUS | Status: AC
  Filled 2024-07-23: qty 10

## 2024-07-23 MED ORDER — TRANEXAMIC ACID-NACL 1000-0.7 MG/100ML-% IV SOLN
INTRAVENOUS | Status: AC
Start: 2024-07-23 — End: 2024-07-23
  Administered 2024-07-23: 1000 mg via INTRAVENOUS
  Filled 2024-07-23: qty 100

## 2024-07-23 MED ORDER — PRENATAL MULTIVITAMIN CH
1.0000 | ORAL_TABLET | Freq: Every day | ORAL | Status: DC
  Administered 2024-07-23 – 2024-07-24 (×2): 1 via ORAL
  Filled 2024-07-23 (×2): qty 1

## 2024-07-23 MED ORDER — DIPHENHYDRAMINE HCL 25 MG PO CAPS: 25.0000 mg | ORAL_CAPSULE | Freq: Four times a day (QID) | ORAL | Status: DC | PRN

## 2024-07-23 MED ORDER — SUCCINYLCHOLINE CHLORIDE 200 MG/10ML IV SOSY
PREFILLED_SYRINGE | INTRAVENOUS | Status: DC | PRN
  Administered 2024-07-23: 160 mg via INTRAVENOUS

## 2024-07-23 MED ORDER — LIDOCAINE HCL (CARDIAC) PF 100 MG/5ML IV SOSY
PREFILLED_SYRINGE | INTRAVENOUS | Status: DC | PRN
  Administered 2024-07-23: 30 mg via INTRAVENOUS

## 2024-07-23 MED ORDER — CEFAZOLIN SODIUM-DEXTROSE 2-4 GM/100ML-% IV SOLN
2.0000 g | INTRAVENOUS | Status: AC
  Administered 2024-07-23: 2 g via INTRAVENOUS

## 2024-07-23 MED ORDER — LIDOCAINE HCL (PF) 1 % IJ SOLN: 30.0000 mL | INTRAMUSCULAR | Status: DC | PRN

## 2024-07-23 MED ORDER — CEFAZOLIN SODIUM-DEXTROSE 2-4 GM/100ML-% IV SOLN: 2.0000 g | Freq: Once | INTRAVENOUS | Status: DC

## 2024-07-23 MED ORDER — ACETAMINOPHEN 325 MG PO TABS
650.0000 mg | ORAL_TABLET | ORAL | Status: DC | PRN
  Administered 2024-07-25 (×2): 650 mg via ORAL
  Filled 2024-07-23 (×2): qty 2

## 2024-07-23 MED ORDER — TRANEXAMIC ACID-NACL 1000-0.7 MG/100ML-% IV SOLN: 1000.0000 mg | INTRAVENOUS | Status: AC

## 2024-07-23 MED ORDER — OXYTOCIN-SODIUM CHLORIDE 30-0.9 UT/500ML-% IV SOLN: 2.5000 [IU]/h | INTRAVENOUS | Status: DC | PRN

## 2024-07-23 MED ORDER — ACETAMINOPHEN 325 MG PO TABS: 650.0000 mg | ORAL_TABLET | ORAL | Status: DC | PRN

## 2024-07-23 MED ORDER — OXYTOCIN-SODIUM CHLORIDE 30-0.9 UT/500ML-% IV SOLN
10.0000 [IU]/h | INTRAVENOUS | Status: DC
Start: 2024-07-23 — End: 2024-07-25

## 2024-07-23 MED ORDER — ONDANSETRON HCL 4 MG PO TABS
4.0000 mg | ORAL_TABLET | ORAL | Status: DC | PRN
Start: 2024-07-23 — End: 2024-07-25

## 2024-07-23 MED ORDER — BENZOCAINE-MENTHOL 20-0.5 % EX AERO: 1.0000 | INHALATION_SPRAY | CUTANEOUS | Status: DC | PRN

## 2024-07-23 MED ORDER — LACTATED RINGERS IV SOLN: INTRAVENOUS | Status: DC | PRN

## 2024-07-23 MED ORDER — SOD CITRATE-CITRIC ACID 500-334 MG/5ML PO SOLN: 30.0000 mL | ORAL | Status: DC | PRN

## 2024-07-23 MED ORDER — STERILE WATER FOR IRRIGATION IR SOLN
Status: DC | PRN
  Administered 2024-07-23: 1000 mL

## 2024-07-23 MED ORDER — DIBUCAINE (PERIANAL) 1 % EX OINT: 1.0000 | TOPICAL_OINTMENT | CUTANEOUS | Status: DC | PRN

## 2024-07-23 MED ORDER — FENTANYL CITRATE (PF) 100 MCG/2ML IJ SOLN
25.0000 ug | INTRAMUSCULAR | Status: DC | PRN
  Administered 2024-07-23 (×2): 50 ug via INTRAVENOUS

## 2024-07-23 MED ORDER — PROPOFOL 10 MG/ML IV BOLUS
INTRAVENOUS | Status: DC | PRN
  Administered 2024-07-23: 50 mg via INTRAVENOUS
  Administered 2024-07-23: 100 mg via INTRAVENOUS

## 2024-07-23 MED ORDER — ONDANSETRON HCL 4 MG/2ML IJ SOLN
4.0000 mg | Freq: Four times a day (QID) | INTRAMUSCULAR | Status: DC | PRN
  Administered 2024-07-23: 4 mg via INTRAVENOUS

## 2024-07-23 MED ORDER — SENNOSIDES-DOCUSATE SODIUM 8.6-50 MG PO TABS
2.0000 | ORAL_TABLET | Freq: Every day | ORAL | Status: DC
  Administered 2024-07-24: 2 via ORAL
  Filled 2024-07-23: qty 2

## 2024-07-23 MED ORDER — COCONUT OIL OIL
1.0000 | TOPICAL_OIL | Status: DC | PRN
  Administered 2024-07-24: 1 via TOPICAL

## 2024-07-23 MED ORDER — WITCH HAZEL-GLYCERIN EX PADS: 1.0000 | MEDICATED_PAD | CUTANEOUS | Status: DC | PRN

## 2024-07-23 MED ORDER — DEXAMETHASONE SOD PHOSPHATE PF 10 MG/ML IJ SOLN
INTRAMUSCULAR | Status: DC | PRN
Start: 2024-07-23 — End: 2024-07-23
  Administered 2024-07-23: 10 mg via INTRAVENOUS

## 2024-07-23 MED ORDER — MIDAZOLAM HCL 2 MG/2ML IJ SOLN
INTRAMUSCULAR | Status: AC
  Filled 2024-07-23: qty 2

## 2024-07-23 MED ORDER — LACTATED RINGERS IV SOLN: INTRAVENOUS | Status: DC

## 2024-07-23 MED ORDER — METHYLERGONOVINE MALEATE 0.2 MG/ML IJ SOLN
0.2000 mg | Freq: Once | INTRAMUSCULAR | Status: DC
Start: 2024-07-23 — End: 2024-07-23

## 2024-07-23 SURGICAL SUPPLY — 15 items
CATH ROBINSON RED A/P 16FR (CATHETERS) ×1 IMPLANT
CONT PATH 16OZ SNAP LID 3702 (MISCELLANEOUS) ×1 IMPLANT
GLOVE BIOGEL PI IND STRL 7.0 (GLOVE) ×1 IMPLANT
GLOVE SURG SS PI 7.0 STRL IVOR (GLOVE) ×1 IMPLANT
GLOVE SURG UNDER POLY LF SZ7.5 (GLOVE) ×1 IMPLANT
GOWN STRL REUS W/ TWL LRG LVL3 (GOWN DISPOSABLE) ×1 IMPLANT
GOWN STRL REUS W/ TWL XL LVL3 (GOWN DISPOSABLE) ×1 IMPLANT
HIBICLENS CHG 4% 4OZ BTL (MISCELLANEOUS) ×1 IMPLANT
MAT PREVALON FULL STRYKER (MISCELLANEOUS) IMPLANT
NS IRRIG 1000ML POUR BTL (IV SOLUTION) ×1 IMPLANT
PACK VAGINAL MINOR WOMEN LF (CUSTOM PROCEDURE TRAY) ×1 IMPLANT
PAD OB MATERNITY 4.3X12.25 (PERSONAL CARE ITEMS) ×1 IMPLANT
PAD PREP 24X48 CUFFED NSTRL (MISCELLANEOUS) ×1 IMPLANT
SUT VIC AB 2-0 CT1 (SUTURE) IMPLANT
TOWEL OR 17X24 6PK STRL BLUE (TOWEL DISPOSABLE) ×2 IMPLANT

## 2024-07-23 NOTE — Transfer of Care (Signed)
 Immediate Anesthesia Transfer of Care Note  Patient: Kelly [name]  Procedure(s) Performed: DILATION AND CURETTAGE  Patient Location: PACU  Anesthesia Type:General  Level of Consciousness: awake, alert , oriented, and patient cooperative  Airway & Oxygen Therapy: Patient Spontanous Breathing and Patient connected to face mask  Post-op Assessment: Report given to RN, Post -op Vital signs reviewed and stable, and Patient moving all extremities X 4  Post vital signs: Reviewed and stable  Last Vitals:  Vitals Value Taken Time  BP    Temp 36.5 C 07/23/24 04:07  Pulse    Resp    SpO2      Last Pain:  Vitals:   07/23/24 0407  TempSrc: Oral  PainSc:          Complications: No notable events documented.

## 2024-07-23 NOTE — Lactation Note (Signed)
 This note was copied from a baby's chart. Lactation Consultation Note  Patient Name: Kelly [name] Unijb'd Date: 07/23/2024 Age:31 hours Reason for consult: Initial assessment;Term  P2. Mom stated the baby will BF well at intervals when he doesn't she gives him formula.  Newborn feeding habits, STS, I&O, body alignment, positioning, supplementing, pumping reviewed. Mom encouraged to feed baby 8-12 times/24 hours and with feeding cues.  Tried to get baby to latch but he wasn't really interested. He latched in cross cradle position and suckled a few times then slept. Placed baby back in bassinet on his back. Mom stated she feels good about the feedings when he latches he feeds good, but then he's sleepy doesn't want to eat. Encouraged mom to call for assistance as needed. Maternal Data Has patient been taught Hand Expression?: Yes Does the patient have breastfeeding experience prior to this delivery?: Yes How long did the patient breastfeed?: 9 months until she got pregnant w/this baby  Feeding Mother's Current Feeding Choice: Breast Milk and Formula Nipple Type: Slow - flow  LATCH Score Latch: Too sleepy or reluctant, no latch achieved, no sucking elicited.  Audible Swallowing: None  Type of Nipple: Everted at rest and after stimulation  Comfort (Breast/Nipple): Soft / non-tender  Hold (Positioning): No assistance needed to correctly position infant at breast.  LATCH Score: 6   Lactation Tools Discussed/Used Tools: Pump Breast pump type: Double-Electric Breast Pump (RN set up) Reason for Pumping: blood loss  Interventions Interventions: Breast feeding basics reviewed;Breast massage;Hand express;Breast compression;Support pillows;Position options;Education;DEBP;LC Services brochure  Discharge Pump: DEBP;Hands Free (mom has Spectra  and Mom cozy)  Consult Status Consult Status: Follow-up Date: 07/24/24 Follow-up type: In-patient    Zianne Schubring G 07/23/2024,  10:36 PM

## 2024-07-23 NOTE — Anesthesia Procedure Notes (Signed)
 Procedure Name: Intubation Date/Time: 07/23/2024 2:43 AM  Performed by: Gregorio Adrien SAUNDERS, CRNAPre-anesthesia Checklist: Patient identified, Emergency Drugs available, Suction available and Patient being monitored Patient Re-evaluated:Patient Re-evaluated prior to induction Oxygen Delivery Method: Circle system utilized Preoxygenation: Pre-oxygenation with 100% oxygen Induction Type: IV induction and Rapid sequence Laryngoscope Size: Mac, 3 and Glidescope Grade View: Grade II Tube type: Oral Tube size: 7.0 mm Number of attempts: 1 Airway Equipment and Method: Stylet Placement Confirmation: ETT inserted through vocal cords under direct vision, positive ETCO2 and breath sounds checked- equal and bilateral Secured at: 21 (right lip) cm Tube secured with: Tape Dental Injury: Teeth and Oropharynx as per pre-operative assessment

## 2024-07-23 NOTE — Progress Notes (Addendum)
 OB Note Patient presented in active labor and had preciptious delivery. I was called to room, post delivery due to [facility]. EBL when I arrived and attributed to atony per CNM. IV just placed and pt got IV pain meds. Fundus and lower uterine segment felt firm but clots removed on bimanual and still steady trickle. Pt already had received Lysteda , methergine and cytotec when I arrived. Pt declines blood products, which she confirmed in the room. Cervix only 1-2cm dilated and unable to place jada in room manually, with speculum and with u/s guidance due to patient discomfort. Pt still with steady trickling and able to remove clots. Pt consented for eua, d&c, jada placement, all indicated procedures.      Latest Ref Rng & Units 07/23/2024    1:36 AM 07/23/2024    1:34 AM 06/18/2024   11:18 AM  CBC  WBC 4.0 - 10.5 K/uL 9.7   8.6   Hemoglobin 12.0 - 15.0 g/dL 89.3   89.8   Hematocrit 36.0 - 46.0 % 32.1   30.4   Platelets 150 - 400 K/uL 193  204  195   DIC panel pending  Bebe Izell Raddle MD Attending Center for [facility] (Faculty Practice) 07/23/2024 Time: [phone]

## 2024-07-23 NOTE — Anesthesia Postprocedure Evaluation (Signed)
 Anesthesia Post Note  Patient: Kelly [name]  Procedure(s) Performed: DILATION AND CURETTAGE     Patient location during evaluation: PACU Anesthesia Type: General Level of consciousness: awake and alert Pain management: pain level controlled Vital Signs Assessment: post-procedure vital signs reviewed and stable Respiratory status: spontaneous breathing, nonlabored ventilation, respiratory function stable and patient connected to nasal cannula oxygen Cardiovascular status: blood pressure returned to baseline and stable Postop Assessment: no apparent nausea or vomiting Anesthetic complications: no   No notable events documented.  Last Vitals:  Vitals:   07/23/24 0456 07/23/24 0558  BP:  108/71  Pulse:  66  Resp:  16  Temp: 36.9 C 36.7 C  SpO2:  100%    Last Pain:  Vitals:   07/23/24 0600  TempSrc:   PainSc: 0-No pain   Pain Goal:                Epidural/Spinal Function Cutaneous sensation: Normal sensation (07/23/24 0600), Patient able to flex knees: Yes (07/23/24 0600), Patient able to lift hips off bed: Yes (07/23/24 0600), Back pain beyond tenderness at insertion site: No (07/23/24 0600), Progressively worsening motor and/or sensory loss: No (07/23/24 0600), Bowel and/or bladder incontinence post epidural: No (07/23/24 0600)  Arjen Deringer L Lahari Suttles

## 2024-07-23 NOTE — Brief Op Note (Signed)
 07/23/2024  3:45 AM  PATIENT:  Kelly [name]  33 y.o. female  PRE-OPERATIVE DIAGNOSIS:  immediate postpartum hemorrage due to atony.   POST-OPERATIVE DIAGNOSIS:  Same.   PROCEDURE: EUA, D&C via ultrasound guidance, jada placement via ultrasound guidance, repair of 1st degree perineal laceration   SURGEON:  Surgeons and Role:    DEWAINE Izell Harari, MD - Primary  ASSISTANTS: none   ANESTHESIA:   general  EBL:  with total EBL from L&D to now approximately  IVF: 2000mL crystalloid   BLOOD ADMINISTERED: none (patient declines)  DRAINS: indwelling foley placed in room and UOP from when placed to now   LOCAL MEDICATIONS USED:  NONE  SPECIMEN:  None  DISPOSITION OF SPECIMEN:  N/A  COUNTS:  YES  TOURNIQUET:  * No tourniquets in log *  DICTATION: .Note written in EPIC  PLAN OF CARE: Admit to inpatient   PATIENT DISPOSITION:  PACU - hemodynamically stable.   Delay start of Pharmacological VTE agent (>24hrs) due to surgical blood loss or risk of bleeding: yes  Harari Izell Raddle MD Attending Center for [employer] (Faculty Practice) 07/23/2024 Time: [phone]

## 2024-07-23 NOTE — Plan of Care (Signed)
 Problem: Education: Goal: Knowledge of General Education information will improve Description: Including pain rating scale, medication(s)/side effects and non-pharmacologic comfort measures 07/23/2024 0459 by Ellena Lucienne BIRCH, RN Outcome: Completed/Met 07/23/2024 0137 by Ellena Lucienne BIRCH, RN Outcome: Progressing   Problem: Health Behavior/Discharge Planning: Goal: Ability to manage health-related needs will improve 07/23/2024 0459 by Ellena Lucienne BIRCH, RN Outcome: Completed/Met 07/23/2024 0137 by Ellena Lucienne BIRCH, RN Outcome: Progressing   Problem: Clinical Measurements: Goal: Ability to maintain clinical measurements within normal limits will improve 07/23/2024 0459 by Ellena Lucienne BIRCH, RN Outcome: Completed/Met 07/23/2024 0137 by Ellena Lucienne BIRCH, RN Outcome: Progressing Goal: Will remain free from infection 07/23/2024 0459 by Ellena Lucienne BIRCH, RN Outcome: Completed/Met 07/23/2024 0137 by Ellena Lucienne BIRCH, RN Outcome: Progressing Goal: Diagnostic test results will improve 07/23/2024 0459 by Ellena Lucienne BIRCH, RN Outcome: Completed/Met 07/23/2024 0137 by Ellena Lucienne BIRCH, RN Outcome: Progressing Goal: Respiratory complications will improve 07/23/2024 0459 by Ellena Lucienne BIRCH, RN Outcome: Completed/Met 07/23/2024 0137 by Ellena Lucienne BIRCH, RN Outcome: Progressing Goal: Cardiovascular complication will be avoided 07/23/2024 0459 by Dorma Altman D, RN Outcome: Completed/Met 07/23/2024 0137 by Ellena Lucienne BIRCH, RN Outcome: Progressing   Problem: Activity: Goal: Risk for activity intolerance will decrease 07/23/2024 0459 by Ellena Lucienne BIRCH, RN Outcome: Completed/Met 07/23/2024 0137 by Ellena Lucienne BIRCH, RN Outcome: Progressing   Problem: Nutrition: Goal: Adequate nutrition will be maintained 07/23/2024 0459 by Ellena Lucienne BIRCH, RN Outcome: Completed/Met 07/23/2024 0137 by Ellena Lucienne BIRCH, RN Outcome:  Progressing   Problem: Coping: Goal: Level of anxiety will decrease 07/23/2024 0459 by Ellena Lucienne BIRCH, RN Outcome: Completed/Met 07/23/2024 0137 by Ellena Lucienne BIRCH, RN Outcome: Progressing   Problem: Elimination: Goal: Will not experience complications related to bowel motility 07/23/2024 0459 by Ellena Lucienne BIRCH, RN Outcome: Completed/Met 07/23/2024 0137 by Ellena Lucienne BIRCH, RN Outcome: Progressing Goal: Will not experience complications related to urinary retention 07/23/2024 0459 by Ellena Lucienne BIRCH, RN Outcome: Completed/Met 07/23/2024 0137 by Ellena Lucienne BIRCH, RN Outcome: Progressing   Problem: Pain Managment: Goal: General experience of comfort will improve and/or be controlled 07/23/2024 0459 by Ellena Lucienne BIRCH, RN Outcome: Completed/Met 07/23/2024 0137 by Ellena Lucienne BIRCH, RN Outcome: Progressing   Problem: Safety: Goal: Ability to remain free from injury will improve 07/23/2024 0459 by Ellena Lucienne BIRCH, RN Outcome: Completed/Met 07/23/2024 0137 by Ellena Lucienne BIRCH, RN Outcome: Progressing   Problem: Skin Integrity: Goal: Risk for impaired skin integrity will decrease 07/23/2024 0459 by Ellena Lucienne BIRCH, RN Outcome: Completed/Met 07/23/2024 0137 by Ellena Lucienne BIRCH, RN Outcome: Progressing   Problem: Education: Goal: Knowledge of Childbirth will improve 07/23/2024 0459 by Ellena Lucienne BIRCH, RN Outcome: Completed/Met 07/23/2024 0137 by Ellena Lucienne BIRCH, RN Outcome: Progressing Goal: Ability to make informed decisions regarding treatment and plan of care will improve 07/23/2024 0459 by Ellena Lucienne BIRCH, RN Outcome: Completed/Met 07/23/2024 0137 by Ellena Lucienne BIRCH, RN Outcome: Progressing Goal: Ability to state and carry out methods to decrease the pain will improve 07/23/2024 0459 by Ellena Lucienne BIRCH, RN Outcome: Completed/Met 07/23/2024 0137 by Ellena Lucienne BIRCH, RN Outcome: Progressing Goal:  Individualized Educational Video(s) 07/23/2024 0459 by Ellena Lucienne BIRCH, RN Outcome: Completed/Met 07/23/2024 0137 by Ellena Lucienne BIRCH, RN Outcome: Progressing   Problem: Coping: Goal: Ability to verbalize concerns and feelings about labor and delivery will improve 07/23/2024 0459 by Ellena Lucienne BIRCH, RN Outcome: Completed/Met 07/23/2024 0137 by Ellena Lucienne BIRCH, RN Outcome: Progressing   Problem: Life Cycle: Goal: Ability to make normal progression  through stages of labor will improve 07/23/2024 0459 by Arlene Brickel D, RN Outcome: Completed/Met 07/23/2024 0137 by Ellena Lucienne BIRCH, RN Outcome: Progressing Goal: Ability to effectively push during vaginal delivery will improve 07/23/2024 0459 by Ellena Lucienne BIRCH, RN Outcome: Completed/Met 07/23/2024 0137 by Ellena Lucienne BIRCH, RN Outcome: Progressing   Problem: Role Relationship: Goal: Will demonstrate positive interactions with the child 07/23/2024 0459 by Ellena Lucienne BIRCH, RN Outcome: Completed/Met 07/23/2024 0137 by Ellena Lucienne BIRCH, RN Outcome: Progressing   Problem: Safety: Goal: Risk of complications during labor and delivery will decrease 07/23/2024 0459 by Ellena Lucienne BIRCH, RN Outcome: Completed/Met 07/23/2024 0137 by Ellena Lucienne BIRCH, RN Outcome: Progressing   Problem: Pain Management: Goal: Relief or control of pain from uterine contractions will improve 07/23/2024 0459 by Ellena Lucienne BIRCH, RN Outcome: Completed/Met 07/23/2024 0137 by Ellena Lucienne BIRCH, RN Outcome: Progressing

## 2024-07-23 NOTE — Lactation Note (Signed)
 This note was copied from a baby's chart. Lactation Consultation Note  Patient Name: Kelly [name] Today's Date: 07/23/2024 Age:31 hours  Mom had to go to OR for [facility] repair.   Maternal Data    Feeding    LATCH Score                    Lactation Tools Discussed/Used    Interventions    Discharge    Consult Status      [name] 07/23/2024, 6:37 AM

## 2024-07-23 NOTE — Discharge Summary (Signed)
 Postpartum Discharge Summary  Date of Service updated-10/26     Patient Name: Kelly [name] DOB: July 05, 1991 MRN: [id number]  Date of admission: 07/23/2024 Delivery date:07/23/2024 Delivering provider: NEWTON MERING Date of discharge: 07/26/2024  Admitting diagnosis: Supervision of normal pregnancy in third trimester [Z34.93] Intrauterine pregnancy: [redacted]w[redacted]d     Secondary diagnosis:  Active Problems:   Supervision of normal pregnancy in third trimester   Postpartum hemorrhage   Vaginal delivery   Acute blood loss anemia (ABLA) Anemia in pregnancy  Additional problems: none    Discharge diagnosis: Term Pregnancy Delivered and PPH                                              Post partum procedures:IV iron  JADA Augmentation: AROM Complications: Hemorrhage>1070mL  Hospital course: Onset of Labor With Vaginal Delivery      33 y.o. yo [id number] at [redacted]w[redacted]d was admitted in Active Labor on 07/23/2024. Labor course was uncomplicated.  Delivery uncomplicated but noted to have postpartum hemorrhage.  Received multiple medications at bedside, but not able to tolerate uterine sweep at bedside.  Pt was taken to OR for EUA and JADA.  See operative note for further information. Membrane Rupture Time/Date: 12:48 AM,07/23/2024  Delivery Method:Vaginal, Spontaneous Operative Delivery:N/A Episiotomy: None Lacerations:  1st degree Patient had a postpartum course complicated by anemia due to acute blood loss due to [facility].  Pt treated with IV iron  and noted improvement.  She is ambulating, tolerating a regular diet, passing flatus, and urinating well. Patient is discharged home in stable condition on 07/26/24.  Newborn Data: Birth date:07/23/2024 Birth time:12:55 AM Gender:Female Living status:Living Apgars:8 ,9  Weight:3640 g  Magnesium Sulfate received: No BMZ received: No Rhophylac:N/A MMR:No T-DaP:Given prenatally Flu: No RSV Vaccine received: No Transfusion:No  IV  Iron   Immunizations received: Immunization History  Administered Date(s) Administered   PFIZER(Purple Top)SARS-COV-2 Vaccination 10/12/2019, 10/29/2019   Tdap 05/07/2024    Physical exam  Vitals:   07/24/24 0610 07/24/24 1256 07/24/24 2039 07/25/24 0640  BP: 110/75 101/65 110/80 105/72  Pulse: 72 79 79 72  Resp: 16 17 18 18   Temp: 98.2 F (36.8 C) 98.2 F (36.8 C) 98.2 F (36.8 C) 98.5 F (36.9 C)  TempSrc: Oral Oral Oral Oral  SpO2: 100% 100% 100% 100%  Weight:      Height:       General: alert, cooperative, and no distress Lochia: appropriate Uterine Fundus: firm Incision: N/A DVT Evaluation: No evidence of DVT seen on physical exam. Labs: Lab Results  Component Value Date   WBC 16.7 (H) 07/24/2024   HGB 7.3 (L) 07/24/2024   HCT 21.8 (L) 07/24/2024   MCV 88.6 07/24/2024   PLT 189 07/24/2024      Latest Ref Rng & Units 12/07/2023   10:08 AM  CMP  Glucose 70 - 99 mg/dL 889   BUN 6 - 20 mg/dL 6   Creatinine 9.55 - 8.99 mg/dL 9.27   Sodium 864 - 854 mmol/L 135   Potassium 3.5 - 5.1 mmol/L 3.8   Chloride 98 - 111 mmol/L 109   CO2 22 - 32 mmol/L 21   Calcium 8.9 - 10.3 mg/dL 8.4   Total Protein 6.5 - 8.1 g/dL 6.7   Total Bilirubin 0.0 - 1.2 mg/dL 0.2   Alkaline Phos 38 - 126 U/L 43   AST 15 -  41 U/L 14   ALT 0 - 44 U/L 10    Edinburgh Score:    07/25/2024    9:09 AM  Edinburgh Postnatal Depression Scale Screening Tool  I have been able to laugh and see the funny side of things. 0  I have looked forward with enjoyment to things. 0  I have blamed myself unnecessarily when things went wrong. 1  I have been anxious or worried for no good reason. 2  I have felt scared or panicky for no good reason. 1  Things have been getting on top of me. 1  I have been so unhappy that I have had difficulty sleeping. 1  I have felt sad or miserable. 0  I have been so unhappy that I have been crying. 0  The thought of harming myself has occurred to me. 0  Edinburgh  Postnatal Depression Scale Total 6   Edinburgh Postnatal Depression Scale Total: 6   After visit meds:  Allergies as of 07/25/2024   No Known Allergies      Medication List     TAKE these medications    acetaminophen  325 MG tablet Commonly known as: Tylenol  Take 2 tablets (650 mg total) by mouth every 6 (six) hours as needed (for pain scale < 4).   ibuprofen  600 MG tablet Commonly known as: ADVIL  Take 1 tablet (600 mg total) by mouth every 6 (six) hours.   Iron  28 MG Tabs Take 28 mg by mouth daily.   multivitamin-prenatal 27-0.8 MG Tabs tablet Take 1 tablet by mouth daily at 12 noon.   oxyCODONE  5 MG immediate release tablet Commonly known as: Oxy IR/ROXICODONE  Take 1 tablet (5 mg total) by mouth every 6 (six) hours as needed for breakthrough pain (pain scale 4-7).         Discharge home in stable condition Infant Feeding: Breast Infant Disposition:home with mother Discharge instruction: per After Visit Summary and Postpartum booklet. Activity: Advance as tolerated. Pelvic rest for 6 weeks.  Diet: routine diet Future Appointments: Future Appointments  Date Time Provider Department Center  09/03/2024 10:15 AM Izell Harari, MD [facility]   Follow up Visit:  Follow-up Information     [facility]. Go in 6 week(s).   Specialty: Obstetrics and Gynecology Why: Please follow up in 5-6 wk postpartum visit Contact information: [address] [place]  [phone]                 Please schedule this patient for a In person postpartum visit in 4 weeks with the following provider: Any provider. Additional Postpartum F/U:  Low risk pregnancy complicated by:  Delivery mode:  Vaginal, Spontaneous Anticipated Birth Control:  POPs   07/26/2024 [name] M Maylon Sailors, DO

## 2024-07-23 NOTE — MAU Note (Addendum)
 Kelly [name] is a 33 y.o. at [redacted]w[redacted]d here in MAU reporting having ctxs and feeling like she needs to push. Bloody show. Taken to 122 and helped to undress. SVE done by Benton Medley RN and pt completely dilated. Sherrell Ely CNM notified will admit pt. Dr Jomarie with pt for transfer to [facility] by stretcher  LMP: na Onset of complaint: Thurs Pain score: 10 There were no vitals filed for this visit.   FHT: 138  Lab orders placed from triage: labor

## 2024-07-23 NOTE — Progress Notes (Signed)
 OB Note     Latest Ref Rng & Units 07/23/2024    3:36 AM 07/23/2024    3:34 AM 07/23/2024    1:36 AM  CBC  WBC 4.0 - 10.5 K/uL 12.2   9.7   Hemoglobin 12.0 - 15.0 g/dL 8.7   89.3   Hematocrit 36.0 - 46.0 % 26.4   32.1   Platelets 150 - 400 K/uL 187  198  193    DIC panel still wnl  VS normal and stable with BPs 110s-120s/70s-80s  Foley with clear UOP approx in bag and scant blood in the suction canister  Plan: Okay to go to [facility] when meeting PACU goals. NPO, leave jada in place with suction at , and maintain foley until 10am CBC is back. Then reassess and see if okay to remove jada, eat and d/c foley   Bebe Izell Raddle MD Attending Center for [employer] (Faculty Practice) 07/23/2024 Time: [id number]

## 2024-07-23 NOTE — Op Note (Signed)
 Operative Note   07/23/2024  PRE-OP DIAGNOSIS: Precipitous spontaneous vaginal delivery approximately 2 hours prior. Postpartum hemorrhage due to atony. Intolerance of exam in the room due to no anesthesia. Declines all blood products. 1st degree perineal laceration.    POST-OP DIAGNOSIS: Same.   SURGEON: Surgeons and Role:    * Izell Harari, MD - Primary  ASSISTANT: None  PROCEDURE: Postpartum ultrasound guided uterine curettage and Jada balloon placement and repair of first degree perineal laceration  ANESTHESIA: General   ESTIMATED BLOOD LOSS: total EBL from the delivery until the end of this case approximately  DRAINS: indwelling foley with UOP from when foley was placed after delivery to the end of this case   TOTAL IV FLUIDS: crystalloid  SPECIMENS: None  VTE PROPHYLAXIS: SCDs to the bilateral lower extremities  ANTIBIOTICS: Two grams of Cefazolin  were given.  COMPLICATIONS: None  DISPOSITION: PACU - hemodynamically stable.  CONDITION: stable  FINDINGS: Exam under anesthesia approximately blood clot in the vaginal vault. Cervix dilated 1-2cm (normal and no evidence of laceration) with scanty endometrium vs decidua seen in the uterus on ultrasound. Scant products of conception on uterine curettage; no evidence of perforation on ultrasound. Jada placement wiith moderate difficulty and confirmed on ultrasound.   PROCEDURE IN DETAIL:  After informed consent was obtained, the patient was taken to the operating room where anesthesia was obtained without difficulty. The patient was positioned in the dorsal lithotomy position in Munday stirrups.  The patient was examined under anesthesia, with the above noted findings.  The weight speculum was placed and, anteriorly, the Deaver retractor. Ringed forceps placed on lips of the cervix and no evidence of laceration.  Ultrasound done with above results and curettage done until minimal POC return and gritty  texture in all four quadrants. Jada placed and inflated to 120 for the vaginal balloon and suction connected to Jada with no issue.  Excellent hemostasis was noted, and all instruments were removed, with 1st degree laceration repaired with 2-0 vicryl in the usual fashion, with excellent hemostasis noted throughout.  She was then taken out of dorsal lithotomy. The patient tolerated the procedure well.  Sponge, lap and instrument counts were correct x2.  The patient was taken to recovery room in excellent condition.  Harari Izell Raddle MD Attending Center [facility] [employer] Midwife)

## 2024-07-23 NOTE — Anesthesia Preprocedure Evaluation (Signed)
 Anesthesia Evaluation  Patient identified by MRN, date of birth, ID band Patient awake    Reviewed: Allergy & Precautions, NPO status , Patient's Chart, lab work & pertinent test results  Airway Mallampati: II  TM Distance: >3 FB Neck ROM: Full    Dental no notable dental hx.    Pulmonary neg pulmonary ROS   Pulmonary exam normal breath sounds clear to auscultation       Cardiovascular negative cardio ROS Normal cardiovascular exam Rhythm:Regular Rate:Normal     Neuro/Psych negative neurological ROS  negative psych ROS   GI/Hepatic negative GI ROS, Neg liver ROS,,,  Endo/Other  negative endocrine ROS    Renal/GU negative Renal ROS  negative genitourinary   Musculoskeletal negative musculoskeletal ROS (+)    Abdominal   Peds  Hematology  (+) Blood dyscrasia, anemia , REFUSES BLOOD PRODUCTSLab Results      Component                Value               Date                      WBC                      9.7                 07/23/2024                HGB                      10.6 (L)            07/23/2024                HCT                      32.1 (L)            07/23/2024                MCV                      88.4                07/23/2024                PLT                      193                 07/23/2024              Anesthesia Other Findings PPH with continued bleeding, no neuraxial for delivery  Reproductive/Obstetrics                              Anesthesia Physical Anesthesia Plan  ASA: 2 and emergent  Anesthesia Plan: General   Post-op Pain Management:    Induction: Intravenous and Rapid sequence  PONV Risk Score and Plan: 3 and Midazolam , Dexamethasone  and Ondansetron   Airway Management Planned: Oral ETT  Additional Equipment:   Intra-op Plan:   Post-operative Plan: Extubation in OR  Informed Consent: I have reviewed the patients History and Physical, chart,  labs and discussed the procedure including the risks, benefits and alternatives for the proposed anesthesia with the patient  or authorized representative who has indicated his/her understanding and acceptance.     Dental advisory given  Plan Discussed with: CRNA  Anesthesia Plan Comments:         Anesthesia Quick Evaluation

## 2024-07-23 NOTE — Lactation Note (Signed)
 This note was copied from a baby's chart. Lactation Consultation Note  Patient Name: Kelly [name] Unijb'd Date: 07/23/2024 Age:31 hours   Attempted to see  mom. Mom stated baby fed about 40 min. Ago. Asked mom if she would call LC for next feeding. Mom stated OK.  Maternal Data    Feeding Nipple Type: Slow - flow  LATCH Score                    Lactation Tools Discussed/Used    Interventions    Discharge    Consult Status      Roux Brandy G 07/23/2024, 8:32 PM

## 2024-07-24 ENCOUNTER — Encounter (HOSPITAL_COMMUNITY): Payer: Self-pay | Admitting: Obstetrics and Gynecology

## 2024-07-24 DIAGNOSIS — D62 Acute posthemorrhagic anemia: Secondary | ICD-10-CM | POA: Insufficient documentation

## 2024-07-24 LAB — CBC
HCT: 21.8 % — ABNORMAL LOW (ref 36.0–46.0)
Hemoglobin: 7.3 g/dL — ABNORMAL LOW (ref 12.0–15.0)
MCH: 29.7 pg (ref 26.0–34.0)
MCHC: 33.5 g/dL (ref 30.0–36.0)
MCV: 88.6 fL (ref 80.0–100.0)
Platelets: 189 K/uL (ref 150–400)
RBC: 2.46 MIL/uL — ABNORMAL LOW (ref 3.87–5.11)
RDW: 13.4 % (ref 11.5–15.5)
WBC: 16.7 K/uL — ABNORMAL HIGH (ref 4.0–10.5)
nRBC: 0 % (ref 0.0–0.2)

## 2024-07-24 MED ORDER — SODIUM CHLORIDE 0.9 % IV SOLN
500.0000 mg | Freq: Once | INTRAVENOUS | Status: AC
  Administered 2024-07-24: 500 mg via INTRAVENOUS
  Filled 2024-07-24: qty 25

## 2024-07-24 NOTE — Progress Notes (Signed)
 POSTPARTUM PROGRESS NOTE Postpartum Day 1  Subjective: Kelly [name] is a 33 y.o. [id number] s/p NVD with PPH and subsequent EUA, D&C, and Jada at [redacted]w[redacted]d.  She reports she is doing well. No acute events overnight. She denies any problems with ambulating, voiding or po intake. Denies nausea or vomiting.  Pain is well controlled.  Lochia is similar to menses. Denies dizzy/lightheaded, palpitations, SOB.   Objective: Blood pressure 110/75, pulse 72, temperature 98.2 F (36.8 C), temperature source Oral, resp. rate 16, height 5' 7 (1.702 m), weight 92.5 kg, SpO2 100%, unknown if currently breastfeeding.  Physical Exam:  General: alert, cooperative and no distress Heart:regular rate Resp: nonlabored Uterine Fundus: firm, appropriately tender Extremities:  Trace edema Skin: warm, dry  Recent Labs    07/23/24 1026 07/24/24 0555  HGB 8.9* 7.3*  HCT 26.4* 21.8*    Assessment/Plan: Kelly [name] is a 33 y.o. H6E7987 s/p NVD with PPH and subsequent EUA, D&C, and Jada at [redacted]w[redacted]d   PPD#1 - meeting milestones - continue routine postpartum care  PPH Acute blood loss anemia EBL 2200. Hgb on admit 10.6 - PPD1 7.3 - declines blood products   Feeding: breast Contraception: TBD   Dispo: Plan for discharge PPD2.  LOS: 1 day   Barabara Maier, DO FMOB Fellow, Faculty Practice [facility], Center for [employer]

## 2024-07-24 NOTE — Lactation Note (Signed)
 This note was copied from a baby's chart. Lactation Consultation Note  Patient Name: Kelly [name] Date: 07/24/2024 Age:31 hours, P2 experienced breast feeder  Reason for consult: Term;Breastfeeding assistance;Follow-up assessment Baby awake, rooting. LC and RN shadow offer to assist due to mom having Iron  IV infusion.  Per mom breast are heavy and full. LC offered to assess and mom receptive. LC noted boarder line engorged. LC used coconut oil to soften the areola, prepump, hand pump  and reverse pressure.  1st was an attempt  2nd assist was a good latch and baby fed 20 mins with multiple swallows and the breast softened down. Per mom so much better.  3rd latch was the right breast , football with assist and the baby was latched and still feeding with swallows, breast softening.  LC asked the MBU Katelynn Viar to complete the total time for the latch at 1502.  LC also informed Katelynn the patient would need assist to latch due to the Iron  IV infusion and where the IV was placed.  LC informed the nurse mom was board line engorged and it was better.   Maternal Data Has patient been taught Hand Expression?: Yes Does the patient have breastfeeding experience prior to this delivery?: Yes How long did the patient breastfeed?: per mom 9 months  Feeding Mother's Current Feeding Choice: Breast Milk  LATCH Score Latch: Grasps breast easily, tongue down, lips flanged, rhythmical sucking.  Audible Swallowing: Spontaneous and intermittent  Type of Nipple: Everted at rest and after stimulation  Comfort (Breast/Nipple): Filling, red/small blisters or bruises, mild/mod discomfort  Hold (Positioning): Assistance needed to correctly position infant at breast and maintain latch.  LATCH Score: 8   Lactation Tools Discussed/Used  Hand pump with #21 F   Interventions Interventions: Breast feeding basics reviewed;Assisted with latch;Skin to skin;Breast massage;Hand express;Pre-pump  if needed;Reverse pressure;Breast compression;Adjust position;Position options;Coconut oil;Hand pump;Education;LC Services brochure;CDC milk storage guidelines;CDC Guidelines for Breast Pump Cleaning  Discharge Discharge Education: Engorgement and breast care Pump: DEBP;Personal;Manual  Consult Status Consult Status: Follow-up Date: 07/25/24 Follow-up type: In-patient    Rollene Caldron Fanta Wimberley 07/24/2024, 3:52 PM

## 2024-07-25 ENCOUNTER — Other Ambulatory Visit (HOSPITAL_COMMUNITY): Payer: Self-pay

## 2024-07-25 MED ORDER — ACETAMINOPHEN 325 MG PO TABS
650.0000 mg | ORAL_TABLET | Freq: Four times a day (QID) | ORAL | 0 refills | Status: AC | PRN
  Filled 2024-07-25: qty 30, 4d supply, fill #0

## 2024-07-25 MED ORDER — OXYCODONE HCL 5 MG PO TABS
5.0000 mg | ORAL_TABLET | Freq: Four times a day (QID) | ORAL | 0 refills | Status: AC | PRN
  Filled 2024-07-25: qty 10, 3d supply, fill #0

## 2024-07-25 MED ORDER — IBUPROFEN 600 MG PO TABS
600.0000 mg | ORAL_TABLET | Freq: Four times a day (QID) | ORAL | 0 refills | Status: AC
  Filled 2024-07-25: qty 30, 8d supply, fill #0

## 2024-07-25 NOTE — Lactation Note (Addendum)
 This note was copied from a baby's chart. Lactation Consultation Note  Patient Name: Kelly [name] Unijb'd Date: 07/25/2024 Age:31 hours Reason for consult: Follow-up assessment;Term;Breastfeeding assistance  RN request  P2, PPH, Iron  infusion yesterday.   Mother's breasts are filling, becoming engorged. Mother states she has been applying hot packs to her breasts during the night and pumping for up to one hour. She states due to her full breasts, baby has been having difficulty latching.  Suggest prepumping briefly before latching to soften areola. Attempted latching in football hold but baby did not appear hungry after recent bottle, 45 ml of breastmilk.  Reviewed prepumping before latching.  Allow baby to feed on both breasts.  Then ice after if breasts are still uncomfortable and hard. To pump to soften, pump 5-7 min.  If baby will not latch and she is pumping to give him a bottle, pump 15-20 min to empty breasts.  Overpumping will make engorgement worse. Call if further assistance is needed.  Maternal Data Has patient been taught Hand Expression?: Yes Does the patient have breastfeeding experience prior to this delivery?: Yes  Feeding Mother's Current Feeding Choice: Breast Milk Nipple Type: Slow - flow  LATCH Score                    Lactation Tools Discussed/Used    Interventions Interventions: Education;Ice  Discharge Discharge Education: Engorgement and breast care;Warning signs for feeding baby Pump: Personal;DEBP (Spectra )  Consult Status Consult Status: Complete Date: 07/25/24    Shannon Dines [facility] 07/25/2024, 10:08 AM

## 2024-07-25 NOTE — Discharge Instructions (Signed)

## 2024-07-26 ENCOUNTER — Telehealth: Payer: Self-pay | Admitting: Lactation Services

## 2024-07-26 ENCOUNTER — Other Ambulatory Visit (HOSPITAL_COMMUNITY): Payer: Self-pay

## 2024-07-26 NOTE — Telephone Encounter (Signed)
 Calling parent to offer an outpatient lactation appointment, per referral received from provider. Kelly [name]: -Doing ice and cabbage for engorgement to help make the milk flow. Also helps with nipple be more erect with sucking. So far that has been working. Also taking Motrin  with food for swelling.  -Pumping 9 ounces total a day, also latching. -Usually removes about 1 ounce from one breast, and the other will remove 2 ounces.  -[name] engorged. -Pumping 15-20 minutes every 2 hours, and does not always get a good output. Was told not to go over 20 minutes. -Latching if can get nipple to come out of breast, for up to 10-15 minutes then gives up. -[name] baby was on a bottle in the hospital -Giving pumped breast milk and giving in a bottle.  -Using Tommie Tippy bottle. Can hold 9 ounces. Can drink 1 ounce every 30 minutes. Knows milk storage guidelines. -If not enough, supplements with formula but has not had to do that.  -Spectra  pump right breast 24 mm and left breast 21 mm, measured in the hospital   [facility] LC: -Flange fit -Support group -Booked outpatient lactation appt -Discussed Proxy Access -Encouraged to call with any lactation questions or concerns.  -Ice and not heat on breast -Taking Ibuprofen  with food -Milk storage guidelines   Kelly [name], [facility] Center for [facility]

## 2024-07-26 NOTE — Telephone Encounter (Signed)
-----   Message from [place]  Claudene sent at 07/25/2024  9:48 PM EDT ----- Regarding: OP LC Dr. Ozan saw patient and said she would appreciate outpatient lactation support.

## 2024-07-29 NOTE — H&P (Signed)
 Kelly [name] is a 33 y.o. female [phone] with IUP at [redacted]w[redacted]d by US  presenting for contractions.  She reports positive fetal movement. She denies leakage of fluid or vaginal bleeding.  Prenatal History/Complications:  Term SVD w/o problems   PNC at [facility]   Past Medical History: Past Medical History:  Diagnosis Date   Acute medial meniscus tear of left knee 06/20/2020   Adjustment disorder with mixed anxiety and depressed mood 04/12/2014   ASCUS with positive high risk HPV cervical 05/13/2014   2015: Pap ASCUS + HR HPV  2015: Colpo CIN 1  2024 Nml    Past Surgical History: Past Surgical History:  Procedure Laterality Date   DILATION AND CURETTAGE OF UTERUS N/A 07/23/2024   Procedure: DILATION AND CURETTAGE;  Surgeon: Izell Harari, MD;  Location: [facility] LD ORS;  Service: Gynecology;  Laterality: N/A;   KNEE ARTHROSCOPY WITH ANTERIOR CRUCIATE LIGAMENT (ACL) REPAIR WITH HAMSTRING GRAFT Left 06/30/2020   Procedure: LEFT KNEE ARTHROSCOPY WITH ANTERIOR CRUCIATE LIGAMENT (ACL) RECONSTRUCTION, PARTIAL MEDIAL MENISCUS REPAIR;  Surgeon: Jerri Kay HERO, MD;  Location: [facility];  Service: Orthopedics;  Laterality: Left;    Obstetrical History: OB History     Gravida  3   Para  2   Term  2   Preterm  0   AB  1   Living  2      SAB  1   IAB  0   Ectopic  0   Multiple  0   Live Births  2            Social History: Social History   Socioeconomic History   Marital status: Married    Spouse name: Not on file   Number of children: Not on file   Years of education: Not on file   Highest education level: Not on file  Occupational History   Not on file  Tobacco Use   Smoking status: Never   Smokeless tobacco: Never  Vaping Use   Vaping status: Never Used  Substance and Sexual Activity   Alcohol use: Not Currently    Comment: occas, prior to pregnancy   Drug use: Never   Sexual activity: Yes    Partners: Male    Birth control/protection: None   Other Topics Concern   Not on file  Social History Narrative   ** Merged History Encounter **       Social Drivers of Health   Financial Resource Strain: Not on file  Food Insecurity: Unknown (07/23/2024)   Hunger Vital Sign    Worried About Running Out of Food in the Last Year: Never true    Ran Out of Food in the Last Year: Not on file  Transportation Needs: Unknown (07/23/2024)   PRAPARE - Administrator, Civil Service (Medical): No    Lack of Transportation (Non-Medical): Not on file  Physical Activity: Not on file  Stress: Not on file  Social Connections: Unknown (02/10/2022)   Received from [facility]   Social Network    Social Network: Not on file    Family History: Family History  Problem Relation Age of Onset   Hypertension Mother    Diabetes Father    Hypertension Brother    Breast cancer Maternal Grandmother 44   Pancreatic cancer Maternal Grandmother     Allergies: No Known Allergies  No medications prior to admission.    Review of Systems   Constitutional: Negative for fever and chills Eyes: Negative  for visual disturbances Respiratory: Negative for shortness of breath, dyspnea Cardiovascular: Negative for chest pain or palpitations  Gastrointestinal: Negative for vomiting, diarrhea and constipation.  POSITIVE for abdominal pain (contractions) Genitourinary: Negative for dysuria and urgency Musculoskeletal: Negative for back pain, joint pain, myalgias  Neurological: Negative for dizziness and headaches  Blood pressure 105/72, pulse 72, temperature 98.5 F (36.9 C), temperature source Oral, resp. rate 18, height 5' 7 (1.702 m), weight 92.5 kg, SpO2 100%, unknown if currently breastfeeding. General appearance: alert, cooperative, and mild distress Lungs: normal respiratory effort Heart: regular rate and rhythm Abdomen: soft, non-tender; bowel sounds normal Extremities: Homans sign is negative, no sign of DVT DTR's 2+ Presentation:  cephalic Fetal monitoring  Baseline: 140 bpm, Variability: Good {> 6 bpm), Accelerations: Reactive, and Decelerations: Absent Uterine activity  q 2-3 minutes Dilation: 10 Exam by:: JULIANNA Ely, CNM   Prenatal labs: ABO, Rh: A/Positive/-- (04/14 1623) Antibody: Negative (04/14 1623) Rubella: 2.65 (04/14 1623) RPR: NON REACTIVE (10/24 0134)  HBsAg: Negative (04/14 1623)  HIV: Non Reactive (08/08 0836)  GBS: Negative/-- (10/02 1630)   NURSING  PROVIDER  Office Location [facility] Dating by 6wk u/s  [facility] Model Traditional Anatomy U/S wnl  Initiated care at  wks                Language  English              LAB RESULTS   Support Person Husband Genetics NIPS: low risk  AFP: neg    NT/IT (FT only)     Carrier Screen Horizon:   Rhogam  A/Positive/-- (04/14 1623) A1C/GTT Early HgbA1C: neg Third trimester 2 hr GTT: Nml  Flu Vaccine     TDaP Vaccine  05/07/24 Blood Type A/Positive/-- (04/14 1623)  RSV Vaccine  Antibody Negative (04/14 1623)  COVID Vaccine Received/Booster Rubella 2.65 (04/14 1623)  Feeding Plan Breastfeeding RPR Non Reactive (08/08 0836)  Contraception  HBsAg Negative (04/14 1623)  Circumcision Yes if boy HIV Non Reactive (08/08 0836)  Pediatrician   HCVAb Non Reactive (04/14 1623)  Prenatal Classes     BTL Consent  Pap Diagnosis  Date Value Ref Range Status  11/13/2022   Final   - Negative for intraepithelial lesion or malignancy (NILM)    BTL Pre-payment  GC/CT Initial:   36wks:    VBAC Consent  GBS Negative/-- (10/02 1630)  BRx Optimized? [ ]  yes   [ ]  no    DME Rx [ x] BP cuff [ ]  Weight Scale Waterbirth  [ ]  Class [ ]  Consent [ ]  CNM visit  PHQ9 & GAD7 [  ] new OB [  ] 28 weeks  [  ] 36 weeks Induction  [ ]  Orders Entered [ ] Foley Y/N     Prenatal Transfer Tool  Maternal Diabetes: No Genetic Screening: Normal Maternal Ultrasounds/Referrals: Normal Fetal Ultrasounds or other Referrals:  None Maternal Substance Abuse:  No Significant Maternal  Medications:  None Significant Maternal Lab Results: Group B Strep negative Number of Prenatal Visits:greater than 3 verified prenatal visits Maternal Vaccinations:TDap Other Comments:  None  No results found for this or any previous visit (from the past 24 hours).  Assessment: Kelly [name] is a 33 y.o. [phone] with an IUP at [redacted]w[redacted]d presenting for active labor  Plan: #Labor: expectant management #Pain:  Per request #FWB Cat 1 #ID: GBS: neg  #MOF:  breast #MOC: unsure, does not want a BTL anymore  Cathlean Ely 07/29/2024, 7:09 PM

## 2024-08-03 ENCOUNTER — Other Ambulatory Visit (HOSPITAL_BASED_OUTPATIENT_CLINIC_OR_DEPARTMENT_OTHER): Payer: Self-pay

## 2024-08-03 ENCOUNTER — Other Ambulatory Visit: Payer: Self-pay

## 2024-08-03 ENCOUNTER — Emergency Department (HOSPITAL_BASED_OUTPATIENT_CLINIC_OR_DEPARTMENT_OTHER)
Admission: EM | Admit: 2024-08-03 | Discharge: 2024-08-03 | Disposition: A | Discharge status: Home / Self Care | Attending: Emergency Medicine | Admitting: Emergency Medicine

## 2024-08-03 ENCOUNTER — Encounter (HOSPITAL_BASED_OUTPATIENT_CLINIC_OR_DEPARTMENT_OTHER): Payer: Self-pay | Admitting: Emergency Medicine

## 2024-08-03 DIAGNOSIS — N644 Mastodynia: Secondary | ICD-10-CM

## 2024-08-03 DIAGNOSIS — O9229 Other disorders of breast associated with pregnancy and the puerperium: Secondary | ICD-10-CM | POA: Insufficient documentation

## 2024-08-03 MED ORDER — ACETAMINOPHEN 500 MG PO TABS
1000.0000 mg | ORAL_TABLET | Freq: Once | ORAL | Status: AC
  Administered 2024-08-03: 1000 mg via ORAL
  Filled 2024-08-03: qty 2

## 2024-08-03 MED ORDER — CEPHALEXIN 500 MG PO CAPS
500.0000 mg | ORAL_CAPSULE | Freq: Four times a day (QID) | ORAL | 0 refills | Status: AC
  Filled 2024-08-03: qty 28, 7d supply, fill #0

## 2024-08-03 NOTE — Discharge Instructions (Addendum)
 As discussed, you need to follow-up with your OB/GYN by the end of this week. It is okay to breastfeed from the left breast, even if mastitis is developing. please seek emergency care experiencing any new or worsening symptoms.   To help with milk stasis: - Increase breast drainage: Nurse or pump more frequently to help clear the clog. - Nurse on the affected side first: This helps to fully empty the breast. If nursing is too painful, try a different feeding position or pump after let-down. - Gently massage the breast: Massage the area of the clog towards the nipple before and during feeding or pumping. Avoid excessive or vigorous massage. - Apply heat: Use a warm compress, warm moist pack, or take a warm shower before feeding. Gently massage the area while applying heat. - Ensure proper latch: A good latch ensures effective milk removal. Try different positions to see if one helps clear the clog more effectively. - Stay hydrated: Drink plenty of fluids throughout the day. - Wear loose clothing: Avoid tight bras or clothing that constricts the breasts.   Alternating between 650 mg Tylenol  and 400 mg Advil : The best way to alternate taking Acetaminophen  (example Tylenol ) and Ibuprofen  (example Advil /Motrin ) is to take them 3 hours apart. For example, if you take ibuprofen  at 6 am you can then take Tylenol  at 9 am. You can continue this regimen throughout the day, making sure you do not exceed the recommended maximum dose for each drug.

## 2024-08-03 NOTE — ED Provider Notes (Signed)
 [place] EMERGENCY DEPARTMENT AT [facility] Provider Note   CSN: [id number] Arrival date & time: 08/03/24  9171     Patient presents with: Breast Pain   Kelly [name] is a 33 y.o. female who presents to ED concerned for left breast pain developing over the past 24 hours. Patient is 10 days postpartum and currently breast feeding. Patient stating that it feels like a clogged gland. Endorses some intermittent subjective fevers/chills. Denies any other recent infectious symptoms. Patient stating that milk from that breast appears normal.    HPI     Prior to Admission medications   Medication Sig Start Date End Date Taking? Authorizing Provider  cephALEXin (KEFLEX) 500 MG capsule Take 1 capsule (500 mg total) by mouth every 6 (six) hours for 7 days. 08/03/24 08/10/24 Yes Hoy Fraction F, PA-C  acetaminophen  (TYLENOL ) 325 MG tablet Take 2 tablets (650 mg total) by mouth every 6 (six) hours as needed (for pain scale < 4). 07/25/24   Ozan, Jennifer, DO  Ferrous Sulfate  (IRON ) 28 MG TABS Take 28 mg by mouth daily.    [provider]  ibuprofen  (ADVIL ) 600 MG tablet Take 1 tablet (600 mg total) by mouth every 6 (six) hours. 07/25/24   Ozan, Jennifer, DO  oxyCODONE  (OXY IR/ROXICODONE ) 5 MG immediate release tablet Take 1 tablet (5 mg total) by mouth every 6 (six) hours as needed for breakthrough pain (pain scale 4-7). 07/25/24   Ozan, Jennifer, DO  Prenatal Vit-Fe Fumarate-FA (MULTIVITAMIN-PRENATAL) 27-0.8 MG TABS tablet Take 1 tablet by mouth daily at 12 noon.    [provider]  promethazine  (PHENERGAN ) 25 MG tablet Take 1 tablet (25 mg total) by mouth every 6 (six) hours as needed for nausea. 06/30/20 09/02/20  Jerri Kay HERO, MD    Allergies: Patient has no known allergies.    Review of Systems  Skin:        Breast pain    Updated Vital Signs BP 123/83   Pulse 85   Temp 98.4 F (36.9 C)   Resp 20   Wt 83 kg   SpO2 100%   Breastfeeding Yes   BMI  28.66 kg/m   Physical Exam Vitals and nursing note reviewed.  Constitutional:      General: She is not in acute distress.    Appearance: She is not ill-appearing or toxic-appearing.  HENT:     Head: Normocephalic and atraumatic.     Mouth/Throat:     Mouth: Mucous membranes are moist.  Eyes:     General: No scleral icterus.       Right eye: No discharge.        Left eye: No discharge.     Conjunctiva/sclera: Conjunctivae normal.  Cardiovascular:     Rate and Rhythm: Normal rate and regular rhythm.     Pulses: Normal pulses.     Heart sounds: Normal heart sounds. No murmur heard. Pulmonary:     Effort: Pulmonary effort is normal. No respiratory distress.     Breath sounds: Normal breath sounds. No wheezing, rhonchi or rales.  Musculoskeletal:     Right lower leg: No edema.     Left lower leg: No edema.  Skin:    General: Skin is warm and dry.     Findings: No rash.     Comments: Josh, EMT chaperoning breast exam.  Tenderness of breast around 4o'clock position. No erythema, increased warmth, dimpling or fluctuance appreciated.   Neurological:     General: No focal  deficit present.     Mental Status: She is alert. Mental status is at baseline.  Psychiatric:        Mood and Affect: Mood normal.     (all labs ordered are listed, but only abnormal results are displayed) Labs Reviewed - No data to display  EKG: None  Radiology: No results found.   Procedures   Medications Ordered in the ED  acetaminophen  (TYLENOL ) tablet 1,000 mg (1,000 mg Oral Given 08/03/24 1011)                                    Medical Decision Making Risk OTC drugs.    This patient presents to the ED for concern of breast pain, this involves an extensive number of treatment options, and is a complaint that carries with it a high risk of complications and morbidity.  The differential diagnosis includes cellulitis, abscess, sepsis, folliculitis, necrotizing fasciitis, mastitis, milk  stasis, etc.   Co morbidities that complicate the patient evaluation  Breast feeding   Additional history obtained:  OBGYN with Dr. Rudy   Problem List / ED Course / Critical interventions / Medication management  Patient presents to ED concerned for left breast pain. States that it feels like clogged duct. Physical exam with some tenderness of 4 o'clock position of left breast, but is otherwise reassuring. Patient afebrile with stable vitals.  Exam and history are more concerning for milk stasis.  Educated patient on treatment for this.  Will provide patient with printed prescription for Keflex in case symptoms are worsening or developing into mastitis over the next 24 hours.  Patient also agrees to follow-up with her OB/GYN in the next couple days. Staffed with Dr. Lenor. I have reviewed the patients home medicines and have made adjustments as needed. The patient has been appropriately medically screened and/or stabilized in the ED. I have low suspicion for any other emergent medical condition which would require further screening, evaluation or treatment in the ED or require inpatient management. At time of discharge the patient is hemodynamically stable and in no acute distress. I have discussed work-up results and diagnosis with patient and answered all questions. Patient is agreeable with discharge plan. We discussed strict return precautions for returning to the emergency department and they verbalized understanding.     Social Determinants of Health:  none      Final diagnoses:  Breast pain    ED Discharge Orders          Ordered    cephALEXin (KEFLEX) 500 MG capsule  Every 6 hours        08/03/24 0959               Hoy Nidia FALCON, PA-C 08/03/24 1015    Lenor Hollering, MD 08/03/24 1037

## 2024-08-03 NOTE — ED Notes (Signed)

## 2024-08-03 NOTE — ED Triage Notes (Signed)
 Pt c/o LT breast pain starting yesterday 10 days postpartum, currently breastfeeding. Last tylenol  and motrin  at 0200

## 2024-08-10 ENCOUNTER — Telehealth (HOSPITAL_COMMUNITY): Payer: Self-pay | Admitting: *Deleted

## 2024-08-10 NOTE — Telephone Encounter (Signed)
 08/10/2024  Name: Kelly [name] MRN: [id number] DOB: November 05, 1990  Reason for Call:  Transition of Care Hospital Discharge Call  Contact Status: Patient Contact Status: Message  Language assistant needed:          Follow-Up Questions:    Van Postnatal Depression Scale:  In the Past 7 Days:    PHQ2-9 Depression Scale:     Discharge Follow-up:    Post-discharge interventions: NA  Mliss Sieve, RN 08/10/2024 11:18

## 2024-09-03 ENCOUNTER — Ambulatory Visit: Admitting: Obstetrics and Gynecology

## 2024-09-13 ENCOUNTER — Encounter: Payer: Self-pay | Admitting: Obstetrics and Gynecology

## 2024-09-13 ENCOUNTER — Ambulatory Visit: Admitting: Obstetrics and Gynecology

## 2024-09-13 VITALS — BP 112/71 | HR 68 | Wt 183.2 lb

## 2024-09-13 DIAGNOSIS — O9081 Anemia of the puerperium: Secondary | ICD-10-CM

## 2024-09-13 MED ORDER — NORETHINDRONE 0.35 MG PO TABS: 1.0000 | ORAL_TABLET | Freq: Every day | ORAL | 3 refills | Status: AC

## 2024-09-13 NOTE — Progress Notes (Addendum)
" ° ° °  Post Partum Visit Note  Kelly [name] is a 33 y.o. [id number] s/p 10/24 SVD with deliveyr c/b PPH due to atony with need for curettage and Jada placement and 1st degree laceration repair.    Anesthesia: none. Postpartum course has been good, with some anxiousness with two kids. Baby is doing well. Baby is feeding by breast. Bleeding no bleeding. Bowel function is normal. Bladder function is normal. Patient is sexually active. Contraception method is condoms.  patient has used estrogen non OCP method in the past but desires POP due to not wanting to impact milk supply.    Edinburgh Postnatal Depression Scale - 09/13/24 1545       Edinburgh Postnatal Depression Scale:  In the Past 7 Days   I have been able to laugh and see the funny side of things. 0    I have looked forward with enjoyment to things. 0    I have blamed myself unnecessarily when things went wrong. 1    I have been anxious or worried for no good reason. 2    I have felt scared or panicky for no good reason. 2    Things have been getting on top of me. 1    I have been so unhappy that I have had difficulty sleeping. 1    I have felt sad or miserable. 1    I have been so unhappy that I have been crying. 1    The thought of harming myself has occurred to me. 0    Edinburgh Postnatal Depression Scale Total 9         Baby not taking a bottle which is causing stress for the patient and having two kids under two, going back to work.   Review of Systems Pertinent items are noted in HPI.  Objective:  BP 112/71   Pulse 68   Wt 183 lb 4 oz (83.1 kg)   Breastfeeding Yes   BMI 28.70 kg/m    General: NAD  Assessment:   Patient stable  Plan:  *PP: I d/w her that progestin only methods may also impact milk supply. Pt is feeding demand and is feeding at least q1-2h, even at night. I d/w her that lactational amenorrhea birth control rate is similar to OCPs for the first six months. After d/w pt, she desires POP method. I  d/w her importance of taking at the same time every day and the three hour window.  Behavioral strategies d/w her.  Diagnosis  Date Value Ref Range Status  11/13/2022   Final   - Negative for intraepithelial lesion or malignancy (NILM)  *Anemia: follow up repeat level. Pt not on PO iron   Bebe Izell Raddle MD Attending Center for [facility])  "

## 2024-09-14 LAB — CBC
Hematocrit: 35.8 % (ref 34.0–46.6)
Hemoglobin: 11.8 g/dL (ref 11.1–15.9)
MCH: 28.3 pg (ref 26.6–33.0)
MCHC: 33 g/dL (ref 31.5–35.7)
MCV: 86 fL (ref 79–97)
Platelets: 332 x10E3/uL (ref 150–450)
RBC: 4.17 x10E6/uL (ref 3.77–5.28)
RDW: 11.2 % — ABNORMAL LOW (ref 11.7–15.4)
WBC: 5 x10E3/uL (ref 3.4–10.8)

## 2024-09-15 ENCOUNTER — Telehealth: Payer: Self-pay

## 2024-09-15 ENCOUNTER — Ambulatory Visit: Payer: Self-pay | Admitting: Obstetrics and Gynecology

## 2024-09-15 NOTE — Telephone Encounter (Signed)
 I spoke with Dr.Pickens who advised the patient to go to [facility] urgent care, however Pt called me and stated that there is an issue with insurance and she is unable to go to the urgent care, Pt is requesting the message be sent to another provider. Therefore message sent to Dr.Newton for further assistance on this matter.

## 2024-09-15 NOTE — Telephone Encounter (Signed)
 Pt called today requesting to be placed on depression medication as she feels that her depression symptoms have gotten worse since she was last seen, I advised the patient of the [facility] urgent care and that I would reach out to provider that pt seen at her visit on 09/13/24.    Pt stating to reply to her via MyChart. Advised Pt I will be in contact after hearing from the provider

## 2024-09-16 ENCOUNTER — Encounter: Payer: Self-pay | Admitting: Obstetrics and Gynecology

## 2024-09-16 NOTE — Telephone Encounter (Signed)
 Patient scheduled to see me on 12/19 to discussed her mental health

## 2024-09-17 ENCOUNTER — Telehealth (INDEPENDENT_AMBULATORY_CARE_PROVIDER_SITE_OTHER): Admitting: Family Medicine

## 2024-09-17 DIAGNOSIS — F332 Major depressive disorder, recurrent severe without psychotic features: Secondary | ICD-10-CM

## 2024-09-17 DIAGNOSIS — F419 Anxiety disorder, unspecified: Secondary | ICD-10-CM

## 2024-09-17 MED ORDER — CITALOPRAM HYDROBROMIDE 20 MG PO TABS: 20.0000 mg | ORAL_TABLET | Freq: Every day | ORAL | 12 refills | Status: AC

## 2024-09-17 MED ORDER — HYDROXYZINE HCL 25 MG PO TABS: 25.0000 mg | ORAL_TABLET | Freq: Four times a day (QID) | ORAL | 2 refills | Status: AC | PRN

## 2024-09-17 NOTE — Progress Notes (Unsigned)
 "  Mood check ENCOUNTER NOTE  Provider location: Center for [facility] at [facility]   Patient location: Home  I connected with Kelly [name] on 09/17/2024 at 10:55 AM EST by MyChart Video Encounter and verified that I am speaking with the correct person using two identifiers. I discussed the limitations, risks, security and privacy concerns of performing an evaluation and management service virtually and the availability of in person appointments. I also discussed with the patient that there may be a patient responsible charge related to this service. The patient expressed understanding and agreed to proceed.  Start time: 8:23 AM  End time: 9:05 AM    Subjective:   Kelly [name] is a 33 y.o. [phone] female here for a mood check   Patient reports they are depression and anxiety  - in teens years, had depression Diagnosed with BPD  Never been treated Pregnancy  Feels lifeless  at times She reached out to mother and MIL Describes intrusive thoughts  Mental Health hospital admissionat age 4--> cut wrists. 2 weeks   Unable to get up  Children screeaming and having pregnancy so close together  Wants to takes meds Took Celexa at age 67 for 5 days.    Reports episodes of self described  mania - describes --- AM will feel overly confident, dancing, happy (at 5 am), by 8 AM is unable to move, sometimes in the midday will be talking fast and doing good - Rapid cycling  In mid to late 20s-- longest amount of time with no sleeping was 48 hours (was working night shift at the time)  Never had 2 full weeks of weeks.    Mother and Brother are on medications for depression- Mother -Zoloft, Celexa--> currenty on nothing  Brother- Lithium, Xanax and deplakote PGM -Diagnosed with bipolar disorder  Describes additive personality symptoms-- eats bags and bags of chocolate throughout the date Wonders about ADHD--> reports hyper activey as a young child. Whiles in school  was hyper and lower focus. Rushes to read material.   Failed 1st grade -- mother quit and sat in on her classes Describes gettting distracted  whiel sdrivinhg Trouble at church as a child Switches jobs frequently   Married for 4 years- husband mentions that she will wake up at 3 AM and clean the house and then [employer]. He never knews which [place] he is getting. Partner has been in same job for 15 years  1/19- Therapy  1/28 with psychiatry-- [facility]-- Therapist   Patient currently breastfeeding? Yes Have they been on prior SSRI/SNRI? No        02/26/2023   11:08 AM 10/16/2022    3:48 PM  GAD 7 : Generalized Anxiety Score  Nervous, Anxious, on Edge 0 0  Control/stop worrying 0 0  Worry too much - different things 0 0  Trouble relaxing 2 0  Restless 0 0  Easily annoyed or irritable 2 0  Afraid - awful might happen 0 0  Total GAD 7 Score 4 0       Gynecologic History No LMP recorded. Contraception: oral progesterone-only contraceptive  Health Maintenance Due  Topic Date Due   Hepatitis B Vaccines 19-59 Average Risk (1 of 3 - 19+ 3-dose series) Never done   HPV VACCINES (1 - 3-dose SCDM series) Never done   Influenza Vaccine  04/30/2024   COVID-19 Vaccine (3 - 2025-26 season) 05/31/2024     The following portions of the patient's history were reviewed and  updated as appropriate: allergies, current medications, past family history, past medical history, past social history, past surgical history and problem list.  Review of Systems Pertinent items are noted in HPI.   Objective:  There were no vitals taken for this visit. Gen: well appearing, NAD HEENT: no scleral icterus CV: RR Lung: Normal WOB Ext: warm well perfused  Pysch: ***   Assessment and Plan:  There are no diagnoses linked to this encounter.  Please refer to After Visit Summary for other counseling recommendations.   No follow-ups on file.  Kelly Maryan Masters, MD, MPH, ABFM Attending Physician Faculty Practice- Center for [facility]  "

## 2024-09-21 ENCOUNTER — Encounter: Payer: Self-pay | Admitting: Family Medicine
